# Patient Record
Sex: Female | Born: 1935
Health system: Southern US, Community
[De-identification: ages and names within clinical notes are randomized; demographics above are authoritative.]

## PROBLEM LIST (undated history)

## (undated) DIAGNOSIS — R112 Nausea with vomiting, unspecified: Secondary | ICD-10-CM

## (undated) DIAGNOSIS — F0390 Unspecified dementia without behavioral disturbance: Secondary | ICD-10-CM

## (undated) DIAGNOSIS — E039 Hypothyroidism, unspecified: Secondary | ICD-10-CM

## (undated) DIAGNOSIS — H5461 Unqualified visual loss, right eye, normal vision left eye: Secondary | ICD-10-CM

## (undated) DIAGNOSIS — IMO0001 Reserved for inherently not codable concepts without codable children: Secondary | ICD-10-CM

## (undated) DIAGNOSIS — I10 Essential (primary) hypertension: Secondary | ICD-10-CM

## (undated) DIAGNOSIS — M1990 Unspecified osteoarthritis, unspecified site: Secondary | ICD-10-CM

## (undated) DIAGNOSIS — Z9889 Other specified postprocedural states: Secondary | ICD-10-CM

## (undated) DIAGNOSIS — D649 Anemia, unspecified: Secondary | ICD-10-CM

## (undated) DIAGNOSIS — K623 Rectal prolapse: Secondary | ICD-10-CM

## (undated) DIAGNOSIS — H3530 Unspecified macular degeneration: Secondary | ICD-10-CM

## (undated) DIAGNOSIS — K219 Gastro-esophageal reflux disease without esophagitis: Secondary | ICD-10-CM

## (undated) DIAGNOSIS — Z5189 Encounter for other specified aftercare: Secondary | ICD-10-CM

## (undated) DIAGNOSIS — E785 Hyperlipidemia, unspecified: Secondary | ICD-10-CM

## (undated) DIAGNOSIS — H9190 Unspecified hearing loss, unspecified ear: Secondary | ICD-10-CM

## (undated) DIAGNOSIS — H409 Unspecified glaucoma: Secondary | ICD-10-CM

## (undated) HISTORY — DX: Unspecified dementia, unspecified severity, without behavioral disturbance, psychotic disturbance, mood disturbance, and anxiety: F03.90

## (undated) HISTORY — DX: Encounter for other specified aftercare: Z51.89

## (undated) HISTORY — DX: Hyperlipidemia, unspecified: E78.5

## (undated) HISTORY — DX: Essential (primary) hypertension: I10

## (undated) HISTORY — DX: Hypothyroidism, unspecified: E03.9

## (undated) HISTORY — PX: VEIN LIGATION AND STRIPPING: SHX2653

## (undated) HISTORY — PX: ABDOMINAL HYSTERECTOMY: SHX81

## (undated) HISTORY — DX: Rectal prolapse: K62.3

## (undated) HISTORY — PX: CARPAL TUNNEL RELEASE: SHX101

## (undated) HISTORY — DX: Gastro-esophageal reflux disease without esophagitis: K21.9

## (undated) HISTORY — DX: Anemia, unspecified: D64.9

## (undated) HISTORY — DX: Unspecified osteoarthritis, unspecified site: M19.90

## (undated) HISTORY — DX: Reserved for inherently not codable concepts without codable children: IMO0001

---

## 1998-08-22 ENCOUNTER — Ambulatory Visit (HOSPITAL_COMMUNITY): Admission: RE | Admit: 1998-08-22 | Discharge: 1998-08-22 | Payer: Self-pay | Admitting: Ophthalmology

## 1998-09-10 HISTORY — PX: OTHER SURGICAL HISTORY: SHX169

## 1999-02-28 ENCOUNTER — Encounter: Payer: Self-pay | Admitting: Orthopaedic Surgery

## 1999-02-28 ENCOUNTER — Ambulatory Visit (HOSPITAL_COMMUNITY): Admission: RE | Admit: 1999-02-28 | Discharge: 1999-02-28 | Payer: Self-pay | Admitting: Orthopaedic Surgery

## 1999-04-19 ENCOUNTER — Encounter: Payer: Self-pay | Admitting: Neurosurgery

## 1999-04-21 ENCOUNTER — Encounter: Payer: Self-pay | Admitting: Neurosurgery

## 1999-04-21 ENCOUNTER — Inpatient Hospital Stay (HOSPITAL_COMMUNITY): Admission: RE | Admit: 1999-04-21 | Discharge: 1999-05-10 | Payer: Self-pay | Admitting: Neurosurgery

## 1999-05-11 HISTORY — PX: LUMBAR FUSION: SHX111

## 1999-05-19 ENCOUNTER — Inpatient Hospital Stay (HOSPITAL_COMMUNITY): Admission: EM | Admit: 1999-05-19 | Discharge: 1999-05-24 | Payer: Self-pay | Admitting: Emergency Medicine

## 1999-05-19 ENCOUNTER — Encounter: Payer: Self-pay | Admitting: Neurosurgery

## 1999-05-24 ENCOUNTER — Inpatient Hospital Stay
Admission: RE | Admit: 1999-05-24 | Discharge: 1999-06-01 | Payer: Self-pay | Admitting: Physical Medicine and Rehabilitation

## 1999-06-16 ENCOUNTER — Encounter: Admission: RE | Admit: 1999-06-16 | Discharge: 1999-06-16 | Payer: Self-pay | Admitting: Neurosurgery

## 1999-06-16 ENCOUNTER — Encounter: Payer: Self-pay | Admitting: Neurosurgery

## 1999-07-27 ENCOUNTER — Encounter: Admission: RE | Admit: 1999-07-27 | Discharge: 1999-07-27 | Payer: Self-pay | Admitting: Neurosurgery

## 1999-07-27 ENCOUNTER — Encounter: Payer: Self-pay | Admitting: Neurosurgery

## 2000-03-13 ENCOUNTER — Encounter: Payer: Self-pay | Admitting: Family Medicine

## 2000-03-13 ENCOUNTER — Encounter: Admission: RE | Admit: 2000-03-13 | Discharge: 2000-03-13 | Payer: Self-pay | Admitting: Family Medicine

## 2000-06-19 ENCOUNTER — Encounter: Admission: RE | Admit: 2000-06-19 | Discharge: 2000-09-17 | Payer: Self-pay | Admitting: Family Medicine

## 2000-08-12 ENCOUNTER — Encounter: Admission: RE | Admit: 2000-08-12 | Discharge: 2000-08-12 | Payer: Self-pay | Admitting: Orthopaedic Surgery

## 2000-08-12 ENCOUNTER — Encounter: Payer: Self-pay | Admitting: Orthopaedic Surgery

## 2000-08-13 ENCOUNTER — Ambulatory Visit (HOSPITAL_BASED_OUTPATIENT_CLINIC_OR_DEPARTMENT_OTHER): Admission: RE | Admit: 2000-08-13 | Discharge: 2000-08-13 | Payer: Self-pay | Admitting: Orthopaedic Surgery

## 2001-06-11 LAB — HM COLONOSCOPY: HM Colonoscopy: NORMAL

## 2002-03-18 ENCOUNTER — Encounter: Admission: RE | Admit: 2002-03-18 | Discharge: 2002-06-16 | Payer: Self-pay | Admitting: Family Medicine

## 2003-10-11 LAB — HM MAMMOGRAPHY

## 2004-01-04 ENCOUNTER — Ambulatory Visit (HOSPITAL_COMMUNITY): Admission: RE | Admit: 2004-01-04 | Discharge: 2004-01-04 | Payer: Self-pay | Admitting: Ophthalmology

## 2004-03-12 HISTORY — PX: CATARACT EXTRACTION: SUR2

## 2004-04-24 ENCOUNTER — Ambulatory Visit: Payer: Self-pay | Admitting: Family Medicine

## 2004-06-05 ENCOUNTER — Ambulatory Visit: Payer: Self-pay | Admitting: Family Medicine

## 2004-07-17 ENCOUNTER — Ambulatory Visit: Payer: Self-pay | Admitting: Family Medicine

## 2005-02-26 ENCOUNTER — Ambulatory Visit: Payer: Self-pay | Admitting: Family Medicine

## 2005-06-14 ENCOUNTER — Ambulatory Visit: Payer: Self-pay | Admitting: Family Medicine

## 2006-01-10 ENCOUNTER — Ambulatory Visit: Payer: Self-pay | Admitting: Family Medicine

## 2006-01-10 LAB — CONVERTED CEMR LAB: Hgb A1c MFr Bld: 6.3 %

## 2006-05-23 ENCOUNTER — Emergency Department (HOSPITAL_COMMUNITY): Admission: EM | Admit: 2006-05-23 | Discharge: 2006-05-23 | Payer: Self-pay | Admitting: Family Medicine

## 2006-07-15 ENCOUNTER — Encounter: Payer: Self-pay | Admitting: Family Medicine

## 2006-07-15 DIAGNOSIS — E039 Hypothyroidism, unspecified: Secondary | ICD-10-CM | POA: Insufficient documentation

## 2006-07-15 DIAGNOSIS — H269 Unspecified cataract: Secondary | ICD-10-CM | POA: Insufficient documentation

## 2006-07-15 DIAGNOSIS — D649 Anemia, unspecified: Secondary | ICD-10-CM | POA: Insufficient documentation

## 2006-07-23 DIAGNOSIS — F0390 Unspecified dementia without behavioral disturbance: Secondary | ICD-10-CM | POA: Insufficient documentation

## 2006-07-23 DIAGNOSIS — J309 Allergic rhinitis, unspecified: Secondary | ICD-10-CM | POA: Insufficient documentation

## 2006-07-23 DIAGNOSIS — E785 Hyperlipidemia, unspecified: Secondary | ICD-10-CM | POA: Insufficient documentation

## 2006-07-23 DIAGNOSIS — M1990 Unspecified osteoarthritis, unspecified site: Secondary | ICD-10-CM | POA: Insufficient documentation

## 2006-07-23 DIAGNOSIS — I10 Essential (primary) hypertension: Secondary | ICD-10-CM | POA: Insufficient documentation

## 2006-07-23 DIAGNOSIS — E119 Type 2 diabetes mellitus without complications: Secondary | ICD-10-CM | POA: Insufficient documentation

## 2006-07-24 ENCOUNTER — Encounter: Admission: RE | Admit: 2006-07-24 | Discharge: 2006-10-22 | Payer: Self-pay | Admitting: Ophthalmology

## 2006-09-19 ENCOUNTER — Ambulatory Visit: Payer: Self-pay | Admitting: Family Medicine

## 2006-09-20 LAB — CONVERTED CEMR LAB
Hgb A1c MFr Bld: 6.4 % — ABNORMAL HIGH (ref 4.6–6.0)
TSH: 1.44 microintl units/mL (ref 0.35–5.50)

## 2006-10-09 ENCOUNTER — Telehealth: Payer: Self-pay | Admitting: Family Medicine

## 2006-10-14 ENCOUNTER — Telehealth (INDEPENDENT_AMBULATORY_CARE_PROVIDER_SITE_OTHER): Payer: Self-pay | Admitting: *Deleted

## 2007-03-24 ENCOUNTER — Ambulatory Visit: Payer: Self-pay | Admitting: Internal Medicine

## 2007-03-24 DIAGNOSIS — K219 Gastro-esophageal reflux disease without esophagitis: Secondary | ICD-10-CM | POA: Insufficient documentation

## 2007-03-24 DIAGNOSIS — R32 Unspecified urinary incontinence: Secondary | ICD-10-CM | POA: Insufficient documentation

## 2007-03-24 LAB — CONVERTED CEMR LAB
ALT: 18 units/L (ref 0–35)
AST: 26 units/L (ref 0–37)
Albumin: 4.1 g/dL (ref 3.5–5.2)
Alkaline Phosphatase: 87 units/L (ref 39–117)
BUN: 12 mg/dL (ref 6–23)
Bilirubin, Direct: 0.1 mg/dL (ref 0.0–0.3)
CO2: 27 meq/L (ref 19–32)
Calcium: 9.6 mg/dL (ref 8.4–10.5)
Chloride: 101 meq/L (ref 96–112)
Cholesterol: 124 mg/dL (ref 0–200)
Creatinine, Ser: 0.7 mg/dL (ref 0.4–1.2)
GFR calc Af Amer: 103 mL/min
GFR calc non Af Amer: 85 mL/min
Glucose, Bld: 110 mg/dL — ABNORMAL HIGH (ref 70–99)
HDL: 45.5 mg/dL (ref >39.0)
Hgb A1c MFr Bld: 6.1 % — ABNORMAL HIGH (ref 4.6–6.0)
LDL Cholesterol: 67 mg/dL (ref 0–99)
Potassium: 3.7 meq/L (ref 3.5–5.1)
Sodium: 138 meq/L (ref 135–145)
TSH: 2.03 microintl units/mL (ref 0.35–5.50)
Total Bilirubin: 1.1 mg/dL (ref 0.3–1.2)
Total CHOL/HDL Ratio: 2.7
Total Protein: 7.7 g/dL (ref 6.0–8.3)
Triglycerides: 56 mg/dL (ref 0–149)
VLDL: 11 mg/dL (ref 0–40)

## 2007-03-25 ENCOUNTER — Encounter: Payer: Self-pay | Admitting: Internal Medicine

## 2007-04-29 ENCOUNTER — Telehealth: Payer: Self-pay | Admitting: Internal Medicine

## 2007-05-05 ENCOUNTER — Encounter: Payer: Self-pay | Admitting: Internal Medicine

## 2007-09-15 ENCOUNTER — Encounter: Payer: Self-pay | Admitting: Internal Medicine

## 2007-09-22 ENCOUNTER — Ambulatory Visit: Payer: Self-pay | Admitting: Internal Medicine

## 2007-09-22 LAB — CONVERTED CEMR LAB
ALT: 16 units/L (ref 0–35)
AST: 23 units/L (ref 0–37)
Albumin: 4 g/dL (ref 3.5–5.2)
Alkaline Phosphatase: 77 units/L (ref 39–117)
BUN: 25 mg/dL — ABNORMAL HIGH (ref 6–23)
Basophils Absolute: 0 10*3/uL (ref 0.0–0.1)
Basophils Relative: 0.6 % (ref 0.0–1.0)
Bilirubin, Direct: 0.1 mg/dL (ref 0.0–0.3)
CO2: 29 meq/L (ref 19–32)
Calcium: 9.4 mg/dL (ref 8.4–10.5)
Chloride: 104 meq/L (ref 96–112)
Cholesterol: 113 mg/dL (ref 0–200)
Creatinine, Ser: 0.8 mg/dL (ref 0.4–1.2)
Eosinophils Absolute: 0.5 10*3/uL (ref 0.0–0.7)
Eosinophils Relative: 6.5 % — ABNORMAL HIGH (ref 0.0–5.0)
GFR calc Af Amer: 88 mL/min
GFR calc non Af Amer: 73 mL/min
Glucose, Bld: 106 mg/dL — ABNORMAL HIGH (ref 70–99)
HCT: 34.2 % — ABNORMAL LOW (ref 36.0–46.0)
HDL: 36.9 mg/dL — ABNORMAL LOW (ref >39.0)
Hemoglobin: 12 g/dL (ref 12.0–15.0)
Hgb A1c MFr Bld: 6.3 % — ABNORMAL HIGH (ref 4.6–6.0)
LDL Cholesterol: 57 mg/dL (ref 0–99)
Lymphocytes Relative: 25.9 % (ref 12.0–46.0)
MCHC: 34.9 g/dL (ref 30.0–36.0)
MCV: 88.9 fL (ref 78.0–100.0)
Monocytes Absolute: 0.6 10*3/uL (ref 0.1–1.0)
Monocytes Relative: 8.7 % (ref 3.0–12.0)
Neutro Abs: 4.3 10*3/uL (ref 1.4–7.7)
Neutrophils Relative %: 58.3 % (ref 43.0–77.0)
Platelets: 219 10*3/uL (ref 150–400)
Potassium: 4.4 meq/L (ref 3.5–5.1)
RBC: 3.85 M/uL — ABNORMAL LOW (ref 3.87–5.11)
RDW: 13 % (ref 11.5–14.6)
Sodium: 141 meq/L (ref 135–145)
Total Bilirubin: 0.8 mg/dL (ref 0.3–1.2)
Total CHOL/HDL Ratio: 3.1
Total Protein: 7.2 g/dL (ref 6.0–8.3)
Triglycerides: 95 mg/dL (ref 0–149)
VLDL: 19 mg/dL (ref 0–40)
WBC: 7.3 10*3/uL (ref 4.5–10.5)

## 2007-09-23 ENCOUNTER — Telehealth: Payer: Self-pay | Admitting: Internal Medicine

## 2007-09-23 ENCOUNTER — Encounter: Payer: Self-pay | Admitting: Internal Medicine

## 2007-10-09 ENCOUNTER — Telehealth: Payer: Self-pay | Admitting: Internal Medicine

## 2007-11-25 ENCOUNTER — Telehealth: Payer: Self-pay | Admitting: Internal Medicine

## 2007-11-26 ENCOUNTER — Telehealth: Payer: Self-pay | Admitting: Internal Medicine

## 2008-04-15 ENCOUNTER — Ambulatory Visit: Payer: Self-pay | Admitting: Internal Medicine

## 2008-04-15 LAB — CONVERTED CEMR LAB
ALT: 19 units/L (ref 0–35)
AST: 24 units/L (ref 0–37)
Albumin: 4.1 g/dL (ref 3.5–5.2)
Alkaline Phosphatase: 83 units/L (ref 39–117)
BUN: 24 mg/dL — ABNORMAL HIGH (ref 6–23)
Bilirubin, Direct: 0.1 mg/dL (ref 0.0–0.3)
CO2: 0 meq/L — CL (ref 19–32)
Calcium: 9.7 mg/dL (ref 8.4–10.5)
Chloride: 110 meq/L (ref 96–112)
Cholesterol: 105 mg/dL (ref 0–200)
Creatinine, Ser: 0.8 mg/dL (ref 0.4–1.2)
GFR calc Af Amer: 88 mL/min
GFR calc non Af Amer: 72 mL/min
Glucose, Bld: 99 mg/dL (ref 70–99)
HDL: 39 mg/dL (ref >39.0)
LDL Cholesterol: 54 mg/dL (ref 0–99)
Potassium: 4.8 meq/L (ref 3.5–5.1)
Sodium: 141 meq/L (ref 135–145)
TSH: 2.9 microintl units/mL (ref 0.35–5.50)
Total Bilirubin: 0.6 mg/dL (ref 0.3–1.2)
Total CHOL/HDL Ratio: 2.7
Total Protein: 7.1 g/dL (ref 6.0–8.3)
Triglycerides: 62 mg/dL (ref 0–149)
VLDL: 12 mg/dL (ref 0–40)

## 2008-04-15 LAB — HM DIABETES FOOT EXAM

## 2008-04-16 ENCOUNTER — Encounter: Payer: Self-pay | Admitting: Internal Medicine

## 2008-06-03 ENCOUNTER — Encounter: Payer: Self-pay | Admitting: Internal Medicine

## 2008-09-02 ENCOUNTER — Telehealth: Payer: Self-pay | Admitting: Internal Medicine

## 2008-09-06 ENCOUNTER — Encounter: Payer: Self-pay | Admitting: Internal Medicine

## 2009-04-20 ENCOUNTER — Telehealth: Payer: Self-pay | Admitting: Internal Medicine

## 2009-04-28 ENCOUNTER — Ambulatory Visit: Payer: Self-pay | Admitting: Internal Medicine

## 2009-04-28 LAB — CONVERTED CEMR LAB
ALT: 19 units/L (ref 0–35)
AST: 23 units/L (ref 0–37)
Albumin: 4.1 g/dL (ref 3.5–5.2)
Alkaline Phosphatase: 104 units/L (ref 39–117)
BUN: 20 mg/dL (ref 6–23)
Basophils Absolute: 0.1 10*3/uL (ref 0.0–0.1)
Basophils Relative: 0.9 % (ref 0.0–3.0)
Bilirubin, Direct: 0.1 mg/dL (ref 0.0–0.3)
CO2: 29 meq/L (ref 19–32)
Calcium: 9.2 mg/dL (ref 8.4–10.5)
Chloride: 105 meq/L (ref 96–112)
Cholesterol: 103 mg/dL (ref 0–200)
Creatinine, Ser: 0.9 mg/dL (ref 0.4–1.2)
Eosinophils Absolute: 0.3 10*3/uL (ref 0.0–0.7)
Eosinophils Relative: 3.4 % (ref 0.0–5.0)
GFR calc non Af Amer: 63.04 mL/min (ref >60)
Glucose, Bld: 147 mg/dL — ABNORMAL HIGH (ref 70–99)
HCT: 34.2 % — ABNORMAL LOW (ref 36.0–46.0)
HDL: 47.5 mg/dL (ref >39.00)
Hemoglobin: 11.4 g/dL — ABNORMAL LOW (ref 12.0–15.0)
Hgb A1c MFr Bld: 6.5 % (ref 4.6–6.5)
LDL Cholesterol: 45 mg/dL (ref 0–99)
Lymphocytes Relative: 22.5 % (ref 12.0–46.0)
Lymphs Abs: 1.8 10*3/uL (ref 0.7–4.0)
MCHC: 33.3 g/dL (ref 30.0–36.0)
MCV: 90.6 fL (ref 78.0–100.0)
Monocytes Absolute: 0.6 10*3/uL (ref 0.1–1.0)
Monocytes Relative: 7.7 % (ref 3.0–12.0)
Neutro Abs: 5.3 10*3/uL (ref 1.4–7.7)
Neutrophils Relative %: 65.5 % (ref 43.0–77.0)
Platelets: 204 10*3/uL (ref 150.0–400.0)
Potassium: 4.8 meq/L (ref 3.5–5.1)
RBC: 3.77 M/uL — ABNORMAL LOW (ref 3.87–5.11)
RDW: 12.7 % (ref 11.5–14.6)
Sodium: 140 meq/L (ref 135–145)
TSH: 2.68 microintl units/mL (ref 0.35–5.50)
Total Bilirubin: 0.4 mg/dL (ref 0.3–1.2)
Total CHOL/HDL Ratio: 2
Total Protein: 7.1 g/dL (ref 6.0–8.3)
Triglycerides: 54 mg/dL (ref 0.0–149.0)
VLDL: 10.8 mg/dL (ref 0.0–40.0)
WBC: 8.1 10*3/uL (ref 4.5–10.5)

## 2009-07-04 ENCOUNTER — Encounter: Payer: Self-pay | Admitting: Internal Medicine

## 2010-02-19 ENCOUNTER — Encounter: Payer: Self-pay | Admitting: Internal Medicine

## 2010-04-13 NOTE — Medication Information (Signed)
Summary: Diabetes Supplies/[facility]  Diabetes Supplies/[facility]   Imported By: Sherian Rein 07/06/2009 11:05:19  _____________________________________________________________________  External Attachment:    Type:   Image     Comment:   External Document

## 2010-04-13 NOTE — Letter (Signed)
Summary: Diabetes Supplies/[facility]  Diabetes Supplies/[facility]   Imported By: Sherian Rein 02/23/2010 14:27:12  _____________________________________________________________________  External Attachment:    Type:   Image     Comment:   External Document

## 2010-04-13 NOTE — Progress Notes (Signed)
Summary: labs/ov  Phone Note Call from Patient   Summary of Call: Patient daughter scheduled her for a 2:50 pm appt.  Would you like to do labs prior to this ov or should this be changed to an AM appt for patient to do labs? Please advise. Initial call taken by: Lucious Groves,  April 20, 2009 12:03 PM  Follow-up for Phone Call        generally for medicare patients labs are to be incident to an office visit. No labs prior to visit.  Follow-up by: Jacques Navy MD,  April 20, 2009 1:25 PM  Additional Follow-up for Phone Call Additional follow up Details #1::        Thanks. Additional Follow-up by: Lucious Groves,  April 20, 2009 2:12 PM

## 2010-04-13 NOTE — Assessment & Plan Note (Signed)
Summary: EST PT YEARLY VISIT PER MD/KB   Vital Signs:  Patient profile:   77 year old female Height:      64 inches Weight:      141 pounds O2 Sat:      98 % on Room air Temp:     97.8 degrees F oral Pulse rate:   96 / minute BP sitting:   118 / 72  (left arm) Cuff size:   regular  Vitals Entered By: Bill Salinas CMA (April 28, 2009 2:51 PM)  O2 Flow:  Room air CC: pt here for yearly visit, pt wants to discuss Avapro and see if there in another cheaper alternative. Pt is unsure of last tetanus, she has never had pneumonia or shingles vaccine, she no longer does mammograms or paps/ ab   Primary Care Provider:  [facility]  CC:  pt here for yearly visit, pt wants to discuss Avapro and see if there in another cheaper alternative. Pt is unsure of last tetanus, she has never had pneumonia or shingles vaccine, and she no longer does mammograms or paps/ ab.  History of Present Illness: Patient presents for routine medical follow-up. In the interval she has been doing well with no medical illnesses, surgeries or injuries.  She has had a few falls but no LOC, no injury.   Current Medications (verified): 1)  Hydrochlorothiazide 50 Mg Tabs (Hydrochlorothiazide) .Marland Kitchen.. 1 By Mouth Once Daily 2)  Levothyroxine Sodium 100 Mcg Tabs (Levothyroxine Sodium) .Marland Kitchen.. 1 By Mouth Qd 3)  Simvastatin 20 Mg Tabs (Simvastatin) .Marland Kitchen.. 1 By Mouth Qd 4)  Klor-Con 10 10 Meq Tbcr (Potassium Chloride) .Marland Kitchen.. 1 By Mouth Tid 5)  Avapro 300 Mg  Tabs (Irbesartan) .Marland Kitchen.. 1 By Mouth Once Daily 6)  Norvasc 10 Mg Tabs (Amlodipine Besylate) .... Take One By Mouth Daily 7)  Aspirin 81 Mg Tbec (Aspirin) .... Take By Mouth Daily As Directed 8)  Vitamin E 1000 Unit  Caps (Vitamin E) .... Take 1 Tablet By Mouth Once A Day 9)  Vitamin C 1000 Mg  Tabs (Ascorbic Acid) .... Take 1 Tablet By Mouth Once A Day 10)  Multivitamins   Tabs (Multiple Vitamin) .... Take 1 Tablet By Mouth Once A Day 11)  Tums 500 Mg  Chew (Calcium Carbonate Antacid)  .... Take 2 Tablet By Mouth Once A Day 12)  Benefiber   Tabs (Wheat Dextrin) .... Daily 13)  Nevanac 0.1 %  Susp (Nepafenac) .... Two Times A Day 14)  Aleve 220 Mg Tabs (Naproxen Sodium) .... Take 1 Tablet By Mouth Once A Day 15)  Alphagan P 0.15 % Soln (Brimonidine Tartrate) .... 2 Drops Daily  Allergies (verified): 1)  Vioxx  Past History:  Past Medical History: Last updated: 04-20-2007 Dementia Diabetes mellitus, type II-diet controlled Hyperlipidemia Hypertension Osteoarthritis- hands Allergic rhinitis h/o anemia GERD Hypothyroidism Urinary incontinence-stress  Past Surgical History: Last updated: 04-20-2007 Carpal tunnel release- bilateral Cataract extraction (2006)-left Hysterectomy-nonmalignant reasons Vein stripping-left leg Right eye surgery (09/1998) Lumbar fusion (03/01) Colonoscopy- diverticulosis, hemorrhoids (06/2001)  Family History: Last updated: 2007/04/20 Father-deceased: MI Mother-deceased CVA, grief brother - MI at 58 Neg-breast, colon cancer  Social History: Last updated: 04-20-2007 married 30 years -divorced; remarried 89 -widowed '83  1 daughter 1 son 1 grandchild, 2 great grandchildren worked in Hospital doctor, retired lives alone - IADLs except driving Terminal care issues: patient clearly states, daughter present, that she would not want heroic or futile care, e.g. CPR, mechanical vent. support, etc.  Review of  Systems  The patient denies anorexia, fever, weight loss, weight gain, decreased hearing, chest pain, dyspnea on exertion, prolonged cough, hemoptysis, melena, incontinence, muscle weakness, difficulty walking, depression, enlarged lymph nodes, and angioedema.    Physical Exam  General:  WNWD elderly white female in no distress Head:  Normocephalic and atraumatic without obvious abnormalities. No apparent alopecia or balding. Eyes:  vision grossly intact, pupils equal, pupils round, corneas and lenses clear, and  no injection.   Ears:  R ear normal and L ear normal.   Mouth:  native dentition with some missing molars, no buccal lesions throat clear Neck:  supple, full ROM, and no thyromegaly.   Chest Wall:  kyphosis moderate to severe Breasts:  No mass, nodules, thickening, tenderness, bulging, retraction, inflamation, nipple discharge or skin changes noted.   Lungs:  normal respiratory effort, no intercostal retractions, no accessory muscle use, normal breath sounds, no crackles, and no wheezes.   Heart:  normal rate, regular rhythm, no murmur, and no JVD.   Abdomen:  soft, non-tender, normal bowel sounds, no masses, no rigidity, no abdominal hernia, and no hepatomegaly.   Msk:  normal ROM, no redness over joints, no joint deformities, and no joint instability.   Pulses:  2+ radial pulse, 1+ DP pulse Extremities:  No clubbing, cyanosis, edema, or deformity noted with normal full range of motion of all joints.   Neurologic:  alert & oriented X3, cranial nerves II-XII intact, strength normal in all extremities, sensation intact to light touch, sensation intact to pinprick, gait normal, and DTRs symmetrical and normal.     Impression & Recommendations:  Problem # 1:  URINARY INCONTINENCE (ICD-788.30) She seems to be doing better. Will not add any medications at this time.  Problem # 2:  GERD (ICD-530.81) State on H2 blocker therapy.  The following medications were removed from the medication list:    Famotidine 20 Mg Tabs (Famotidine) .Marland Kitchen... 1 by mouth bid Her updated medication list for this problem includes:    Tums 500 Mg Chew (Calcium carbonate antacid) .Marland Kitchen... Take 2 tablet by mouth once a day  Problem # 3:  Hx of HYPOTHYROIDISM (ICD-244.9) Due for lab with recommendations to follow.  Her updated medication list for this problem includes:    Levothyroxine Sodium 100 Mcg Tabs (Levothyroxine sodium) .Marland Kitchen... 1 by mouth qd  Orders: TLB-TSH (Thyroid Stimulating Hormone) (84443-TSH)  Addendum -  TSH 2.68 which reflects good control  Plan continue present dose of levothyroxine  Problem # 4:  Hx of ANEMIA (ICD-285.9) Due for lab follow up. Orders: TLB-CBC Platelet - w/Differential (85025-CBCD)  Addendum - Hemogobin is stable  Problem # 5:  ALLERGIC RHINITIS (ICD-477.9) Chronic problem.  Plan - ok to use otcd non-sedating medication like loratadine  Problem # 6:  HYPERLIPIDEMIA (ICD-272.4) Due for follow up lab with recommendations to follow.  Her updated medication list for this problem includes:    Simvastatin 20 Mg Tabs (Simvastatin) .Marland Kitchen... 1 by mouth qd  Orders: TLB-Lipid Panel (80061-LIPID) TLB-Hepatic/Liver Function Pnl (80076-HEPATIC)  Addendum - HDL 47.5, LDL 45 = excellent control  Plan - continue present medication  Problem # 7:  DIABETES MELLITUS, TYPE II (ICD-250.00) Patient has been diet controlled. She has been asymptomatic. Due for follow-up lab.  Her updated medication list for this problem includes:    Losartan Potassium 100 Mg Tabs (Losartan potassium) .Marland Kitchen... 1 by mouth once daily    Aspirin 81 Mg Tbec (Aspirin) .Marland Kitchen... Take by mouth daily as directed  Orders: TLB-A1C /  Hgb A1C (Glycohemoglobin) (83036-A1C)  Addendum - A1C 6.5% = excellent control. No need to initiate any medical therapy  Problem # 8:  DEMENTIA (ICD-294.8) Patient is stable at home. She has a very supportive family. No indication for medication.  Problem # 9:  Preventive Health Care (ICD-V70.0) Normal physical exam, lab results are excellent.  Patient resistent to mammogram-breast exam was normal. Last colonoscopy in '03. She has had tetnus and flu shots. She is a candidate for pneumonia vaccine.   In summary - a delightful woman who appears to be medically stable. She will return as needed or 6 months.  Complete Medication List: 1)  Hydrochlorothiazide 50 Mg Tabs (Hydrochlorothiazide) .Marland Kitchen.. 1 by mouth once daily 2)  Levothyroxine Sodium 100 Mcg Tabs (Levothyroxine sodium) .Marland Kitchen.. 1  by mouth qd 3)  Simvastatin 20 Mg Tabs (Simvastatin) .Marland Kitchen.. 1 by mouth qd 4)  Klor-con 10 10 Meq Tbcr (Potassium chloride) .Marland Kitchen.. 1 by mouth tid 5)  Losartan Potassium 100 Mg Tabs (Losartan potassium) .Marland Kitchen.. 1 by mouth once daily 6)  Norvasc 10 Mg Tabs (Amlodipine besylate) .... Take one by mouth daily 7)  Aspirin 81 Mg Tbec (Aspirin) .... Take by mouth daily as directed 8)  Vitamin E 1000 Unit Caps (Vitamin e) .... Take 1 tablet by mouth once a day 9)  Vitamin C 1000 Mg Tabs (Ascorbic acid) .... Take 1 tablet by mouth once a day 10)  Multivitamins Tabs (Multiple vitamin) .... Take 1 tablet by mouth once a day 11)  Tums 500 Mg Chew (Calcium carbonate antacid) .... Take 2 tablet by mouth once a day 12)  Benefiber Tabs (Wheat dextrin) .... Daily 13)  Nevanac 0.1 % Susp (Nepafenac) .... Two times a day 14)  Aleve 220 Mg Tabs (Naproxen sodium) .... Take 1 tablet by mouth once a day 15)  Alphagan P 0.15 % Soln (Brimonidine tartrate) .... 2 drops daily  Other Orders: Prescription Created Electronically [id number]) TLB-BMP (Basic Metabolic Panel-BMET) (80048-METABOL)  ent: Denisse Coiner Note: All result statuses are Final unless otherwise noted.  Tests: (1) TSH (TSH)   FastTSH                   2.68 uIU/mL                 0.35-5.50  Tests: (2) BMP (METABOL)   Sodium                    140 mEq/L                   135-145   Potassium                 4.8 mEq/L                   3.5-5.1   Chloride                  105 mEq/L                   96-112   Carbon Dioxide            29 mEq/L                    19-32   Glucose              [H]  147 mg/dL                   60-45  BUN                       20 mg/dL                    8-46   Creatinine                0.9 mg/dL                   9.6-2.9   Calcium                   9.2 mg/dL                   5.2-84.1   GFR                       63.04 mL/min                >60  Tests: (3) Lipid Panel (LIPID)   Cholesterol               103 mg/dL                    3-244     ATP III Classification            Desirable:  < 200 mg/dL                    Borderline High:  200 - 239 mg/dL               High:  > = 240 mg/dL   Triglycerides             54.0 mg/dL                  0.1-027.2     Normal:  <150 mg/dL     Borderline High:  536 - 199 mg/dL   HDL                       64.40 mg/dL                 >34.74   VLDL Cholesterol          10.8 mg/dL                  2.5-95.6   LDL Cholesterol           45 mg/dL                    3-87  CHO/HDL Ratio:  CHD Risk                             2                    Men          Women     1/2 Average Risk     3.4          3.3     Average Risk          5.0          4.4     2X Average Risk          9.6          7.1     3X Average Risk  15.0          11.0                           Tests: (4) Hepatic/Liver Function Panel (HEPATIC)   Total Bilirubin           0.4 mg/dL                   1.1-9.1   Direct Bilirubin          0.1 mg/dL                   4.7-8.2   Alkaline Phosphatase      104 U/L                     39-117   AST                       23 U/L                      0-37   ALT                       19 U/L                      0-35   Total Protein             7.1 g/dL                    9.5-6.2   Albumin                   4.1 g/dL                    1.3-0.8  Tests: (5) Hemoglobin A1C (A1C)   Hemoglobin A1C            6.5 %                       4.6-6.5     Glycemic Control Guidelines for People with Diabetes:     Non Diabetic:  <6%     Goal of Therapy: <7%     Additional Action Suggested:  >8%   Tests: (6) CBC Platelet w/Diff (CBCD)   White Cell Count          8.1 K/uL                    4.5-10.5   Red Cell Count       [L]  3.77 Mil/uL                 3.87-5.11   Hemoglobin           [L]  11.4 g/dL                   65.7-84.6   Hematocrit           [L]  34.2 %                      36.0-46.0   MCV                       90.6 fl  78.0-100.0   MCHC                      33.3  g/dL                   74.2-59.5   RDW                       12.7 %                      11.5-14.6   Platelet Count            204.0 K/uL                  150.0-400.0   Neutrophil %              65.5 %                      43.0-77.0   Lymphocyte %              22.5 %                      12.0-46.0   Monocyte %                7.7 %                       3.0-12.0   Eosinophils%              3.4 %                       0.0-5.0   Basophils %               0.9 %                       0.0-3.0   Neutrophill Absolute      5.3 K/uL                    1.4-7.7   Lymphocyte Absolute       1.8 K/uL                    0.7-4.0   Monocyte Absolute         0.6 K/uL                    0.1-1.0  Eosinophils, Absolute                             0.3 K/uL                    0.0-0.7   Basophils Absolute        0.1 K/uL                    0.0-0.1Prescriptions: LOSARTAN POTASSIUM 100 MG TABS (LOSARTAN POTASSIUM) 1 by mouth once daily  #90 x 3   Entered and Authorized by:   Jacques Navy MD   Signed by:   Jacques Navy MD on 04/28/2009   Method used:   Faxed to ...       [facility] (mail-order)       [address]       [place],  [place]  [id number]       Ph: [phone]       Fax: [phone]   RxID:   [id number] KLOR-CON 10 10 MEQ TBCR (POTASSIUM CHLORIDE) 1 by mouth tid  #270 x 3   Entered and Authorized by:   Jacques Navy MD   Signed by:   Jacques Navy MD on 04/28/2009   Method used:   Faxed to ...       [facility] (mail-order)       [address]       [place], [place]  [id number]       Ph: [phone]       Fax: [phone]   RxID:   [id number] LEVOTHYROXINE SODIUM 100 MCG TABS (LEVOTHYROXINE SODIUM) 1 by mouth qd  #90 x 3   Entered and Authorized by:   Jacques Navy MD   Signed by:   Jacques Navy MD on 04/28/2009   Method used:   Faxed to ...       [facility] (mail-order)       [address]       [place], [place]   [id number]       Ph: [phone]       Fax: [phone]   RxID:   [id number] HYDROCHLOROTHIAZIDE 50 MG TABS (HYDROCHLOROTHIAZIDE) 1 by mouth once daily  #90 x 3   Entered and Authorized by:   Jacques Navy MD   Signed by:   Jacques Navy MD on 04/28/2009   Method used:   Faxed to ...       [facility] (mail-order)       [address]       [place], [place]  [id number]       Ph: [phone]       Fax: [phone]   RxID:   [id number] KLOR-CON 10 10 MEQ TBCR (POTASSIUM CHLORIDE) 1 by mouth tid  #270 x 3   Entered and Authorized by:   Jacques Navy MD   Signed by:   Jacques Navy MD on 04/28/2009   Method used:   Print then Give to Patient   RxID:   [id number] LEVOTHYROXINE SODIUM 100 MCG TABS (LEVOTHYROXINE SODIUM) 1 by mouth qd  #90 x 3   Entered and Authorized by:   Jacques Navy MD   Signed by:   Jacques Navy MD on 04/28/2009   Method used:   Print then Give to Patient   RxID:   [id number] HYDROCHLOROTHIAZIDE 50 MG TABS (HYDROCHLOROTHIAZIDE) 1 by mouth once daily  #90 x 3   Entered and Authorized by:   Jacques Navy MD   Signed by:   Jacques Navy MD on 04/28/2009   Method used:   Print then Give to Patient   RxID:   [id number] POTASSIUM 100 MG TABS (LOSARTAN POTASSIUM) 1 by mouth once daily  #90 x 3   Entered and Authorized by:   Jacques Navy MD   Signed by:   Jacques Navy MD on 04/28/2009   Method used:   Print then Give to Patient   RxID:   [id number]

## 2010-04-13 NOTE — Progress Notes (Signed)
Summary: Refills  Phone Note Call from Patient   Summary of Call: Pt daughter called stating that pt new insurance needs rx's for Simvastatin and Amlodipine sent to them. It will no longer be [facility]. Please send to [facility] Fax # [phone]. She is aware I will have completed today unless further action is needed.  Patient was last seen 04/2008, ok to send the refills? Initial call taken by: Lucious Groves,  April 20, 2009 10:34 AM  Follow-up for Phone Call        OK fo refills. encourage patient to make an appointment Follow-up by: Jacques Navy MD,  April 20, 2009 11:40 AM  Additional Follow-up for Phone Call Additional follow up Details #1::        Patient daughter notified and needed name of pharmacy that goes with patient plan. Per the daughter it is [facility]. RX sent and daugter scheduled appt. Additional Follow-up by: Lucious Groves,  April 20, 2009 11:55 AM    Prescriptions: NORVASC 10 MG TABS (AMLODIPINE BESYLATE) Take one by mouth daily  #90 x 3   Entered by:   Lucious Groves   Authorized by:   Jacques Navy MD   Signed by:   Lucious Groves on 04/20/2009   Method used:   Faxed to ...       [facility] (mail-order)       [address]       [place], [place]  [id number]       Ph: [phone]       Fax: [phone]   RxID:   [id number] SIMVASTATIN 20 MG TABS (SIMVASTATIN) 1 by mouth qd  #90 x 3   Entered by:   Lucious Groves   Authorized by:   Jacques Navy MD   Signed by:   Lucious Groves on 04/20/2009   Method used:   Faxed to ...       [facility] (mail-order)       [address]       [place], [place]  [id number]       Ph: [phone]       Fax: [phone]   RxID:   [id number]

## 2010-04-25 ENCOUNTER — Telehealth: Payer: Self-pay | Admitting: Internal Medicine

## 2010-04-28 ENCOUNTER — Telehealth: Payer: Self-pay | Admitting: Internal Medicine

## 2010-05-03 NOTE — Progress Notes (Signed)
  Phone Note Refill Request Message from:  Patient on April 28, 2010 4:06 PM  Refills Requested: Medication #1:  SIMVASTATIN 20 MG TABS 1 by mouth qd   Dosage confirmed as above?Dosage Confirmed  Medication #2:  NORVASC 10 MG TABS Take one by mouth daily   Dosage confirmed as above?Dosage Confirmed Pt requested a 30 day supply until mail order sends shipments   Method Requested: Electronic Initial call taken by: Margaret Pyle, CMA,  April 28, 2010 4:07 PM    Prescriptions: NORVASC 10 MG TABS (AMLODIPINE BESYLATE) Take one by mouth daily  #30 x 1   Entered by:   Margaret Pyle, CMA   Authorized by:   Jacques Navy MD   Signed by:   Margaret Pyle, CMA on 04/28/2010   Method used:   Electronically to        [facility] [address] [phone]* (retail)       [address]       [place], [place]  [address]       Ph: [phone] or [phone]       Fax: [phone]   RxID:   [id number] SIMVASTATIN 20 MG TABS (SIMVASTATIN) 1 by mouth qd  #30 x 1   Entered by:   Margaret Pyle, CMA   Authorized by:   Jacques Navy MD   Signed by:   Margaret Pyle, CMA on 04/28/2010   Method used:   [facility]        [facility] [address] [phone]* (retail)       [address]       [place], [place]  [address]       Ph: [phone] or [phone]       Fax: [phone]   RxID:   [id number]

## 2010-05-03 NOTE — Progress Notes (Signed)
Summary: refill  Phone Note Call from Patient Call back at Home Phone [phone] Call back at [phone]   Caller: Daughter/Audry Reason for Call: Talk to Doctor Summary of Call: request refill on meds on  Hydrochlothiazide 50mg  Simvastatin 20mg  Lthyroxine 100mg  klor-con Amlodipine Besylate 10mg   please call into [facility] Fax#[phone], pt has only has enough to last the week losartan 300mg   Member ID [id number] 90 Day Supply Initial call taken by: Migdalia Dk,  April 25, 2010 2:01 PM  Follow-up for Phone Call        OK for year supply?  Follow-up by: Lamar Sprinkles, CMA,  April 25, 2010 6:29 PM  Additional Follow-up for Phone Call Additional follow up Details #1::        Shelba Flake for 1 year Additional Follow-up by: Jacques Navy MD,  April 26, 2010 12:48 PM    Additional Follow-up for Phone Call Additional follow up Details #2::    Done, left mess to call office back. If pt only has enough for 1 week she will need temp (14day) supply to go to local pharmacy, will need to offer this to pt..........................Marland KitchenLamar Sprinkles, CMA  April 26, 2010 6:52 PM   Additional Follow-up for Phone Call Additional follow up Details #3:: Details for Additional Follow-up Action Taken: PT's daughter informed.  Additional Follow-up by: Lamar Sprinkles, CMA,  April 27, 2010 8:54 AM  Prescriptions: Claris Gladden POTASSIUM 100 MG TABS (LOSARTAN POTASSIUM) 1 by mouth once daily  #90 x 3   Entered by:   Lamar Sprinkles, CMA   Authorized by:   Jacques Navy MD   Signed by:   Lamar Sprinkles, CMA on 04/26/2010   Method used:   Electronically to        [facility]* (retail)       [address] [place], [place]  [address]       Ph: [phone]       Fax: [phone]   RxID:   [id number] NORVASC 10 MG TABS (AMLODIPINE BESYLATE) Take one by mouth daily  #90 x 3   Entered by:   Lamar Sprinkles, CMA   Authorized by:   Jacques Navy MD  Signed by:   Lamar Sprinkles, CMA on 04/26/2010   Method used:   Electronically to        [facility]* (retail)       [address] [place], [place]  [address]       Ph: [phone]       Fax: [phone]   RxID:   [id number] KLOR-CON 10 10 MEQ TBCR (POTASSIUM CHLORIDE) 1 by mouth tid  #270 x 3   Entered by:   Lamar Sprinkles, CMA   Authorized by:   Jacques Navy MD   Signed by:   Lamar Sprinkles, CMA on 04/26/2010   Method used:   Electronically to        [facility]* (retail)       [address] [place], [place]  [address]       Ph: [phone]       Fax: [phone]   RxID:   [id number] SIMVASTATIN 20 MG TABS (SIMVASTATIN) 1 by mouth qd  #90 x 3   Entered by:   Lamar Sprinkles, CMA   Authorized by:   Jacques Navy MD   Signed by:   Lamar Sprinkles, CMA on 04/26/2010  Method used:   Electronically to        [facility]* (retail)       [address] [place], [place]  [address]       Ph: [phone]       Fax: [phone]   RxID:   [id number] LEVOTHYROXINE SODIUM 100 MCG TABS (LEVOTHYROXINE SODIUM) 1 by mouth qd  #90 x 3   Entered by:   Lamar Sprinkles, CMA   Authorized by:   Jacques Navy MD   Signed by:   Lamar Sprinkles, CMA on 04/26/2010   Method used:   Electronically to        [facility]* (retail)       [address] [place], [place]  [address]       Ph: [phone]       Fax: [phone]   RxID:   [id number] HYDROCHLOROTHIAZIDE 50 MG TABS (HYDROCHLOROTHIAZIDE) 1 by mouth once daily  #90 x 3   Entered by:   Lamar Sprinkles, CMA   Authorized by:   Jacques Navy MD   Signed by:   Lamar Sprinkles, CMA on 04/26/2010   Method used:   Electronically to        [facility]* (retail)       [address] [place], [place]  [address]       Ph: [phone]       Fax: [phone]   RxID:   [id number]

## 2010-07-25 ENCOUNTER — Encounter: Payer: Self-pay | Admitting: Internal Medicine

## 2010-07-28 ENCOUNTER — Ambulatory Visit (INDEPENDENT_AMBULATORY_CARE_PROVIDER_SITE_OTHER): Payer: Medicare Other | Admitting: Internal Medicine

## 2010-07-28 ENCOUNTER — Other Ambulatory Visit (INDEPENDENT_AMBULATORY_CARE_PROVIDER_SITE_OTHER): Payer: Medicare Other | Admitting: Internal Medicine

## 2010-07-28 ENCOUNTER — Other Ambulatory Visit (INDEPENDENT_AMBULATORY_CARE_PROVIDER_SITE_OTHER): Payer: Medicare Other

## 2010-07-28 DIAGNOSIS — I10 Essential (primary) hypertension: Secondary | ICD-10-CM

## 2010-07-28 DIAGNOSIS — D649 Anemia, unspecified: Secondary | ICD-10-CM

## 2010-07-28 DIAGNOSIS — Z0000 Encounter for general adult medical examination without abnormal findings: Secondary | ICD-10-CM

## 2010-07-28 DIAGNOSIS — E785 Hyperlipidemia, unspecified: Secondary | ICD-10-CM

## 2010-07-28 DIAGNOSIS — E119 Type 2 diabetes mellitus without complications: Secondary | ICD-10-CM

## 2010-07-28 DIAGNOSIS — E039 Hypothyroidism, unspecified: Secondary | ICD-10-CM

## 2010-07-28 LAB — CBC WITH DIFFERENTIAL/PLATELET
Basophils Absolute: 0 10*3/uL (ref 0.0–0.1)
Basophils Relative: 0.4 % (ref 0.0–3.0)
Eosinophils Absolute: 0.2 10*3/uL (ref 0.0–0.7)
Eosinophils Relative: 2.7 % (ref 0.0–5.0)
HCT: 33 % — ABNORMAL LOW (ref 36.0–46.0)
Hemoglobin: 11.1 g/dL — ABNORMAL LOW (ref 12.0–15.0)
Lymphocytes Relative: 18.5 % (ref 12.0–46.0)
Lymphs Abs: 1.5 10*3/uL (ref 0.7–4.0)
MCHC: 33.8 g/dL (ref 30.0–36.0)
MCV: 89.6 fl (ref 78.0–100.0)
Monocytes Absolute: 0.4 10*3/uL (ref 0.1–1.0)
Monocytes Relative: 4.5 % (ref 3.0–12.0)
Neutro Abs: 6.1 10*3/uL (ref 1.4–7.7)
Neutrophils Relative %: 73.9 % (ref 43.0–77.0)
Platelets: 202 10*3/uL (ref 150.0–400.0)
RBC: 3.68 Mil/uL — ABNORMAL LOW (ref 3.87–5.11)
RDW: 14.3 % (ref 11.5–14.6)
WBC: 8.3 10*3/uL (ref 4.5–10.5)

## 2010-07-28 LAB — HEPATIC FUNCTION PANEL
ALT: 18 U/L (ref 0–35)
AST: 23 U/L (ref 0–37)
Albumin: 3.6 g/dL (ref 3.5–5.2)
Alkaline Phosphatase: 91 U/L (ref 39–117)
Bilirubin, Direct: 0.1 mg/dL (ref 0.0–0.3)
Total Bilirubin: 0.6 mg/dL (ref 0.3–1.2)
Total Protein: 6.4 g/dL (ref 6.0–8.3)

## 2010-07-28 LAB — HEMOGLOBIN A1C: Hgb A1c MFr Bld: 6.4 % (ref 4.6–6.5)

## 2010-07-28 LAB — TSH: TSH: 1.87 u[IU]/mL (ref 0.35–5.50)

## 2010-07-28 LAB — BASIC METABOLIC PANEL
BUN: 20 mg/dL (ref 6–23)
CO2: 28 mEq/L (ref 19–32)
Calcium: 9.1 mg/dL (ref 8.4–10.5)
Chloride: 106 mEq/L (ref 96–112)
Creatinine, Ser: 0.8 mg/dL (ref 0.4–1.2)
GFR: 70.98 mL/min (ref >60.00)
Glucose, Bld: 89 mg/dL (ref 70–99)
Potassium: 5 mEq/L (ref 3.5–5.1)
Sodium: 140 mEq/L (ref 135–145)

## 2010-07-28 LAB — LIPID PANEL
Cholesterol: 83 mg/dL (ref 0–200)
HDL: 40.2 mg/dL (ref >39.00)
LDL Cholesterol: 37 mg/dL (ref 0–99)
Total CHOL/HDL Ratio: 2
Triglycerides: 30 mg/dL (ref 0.0–149.0)
VLDL: 6 mg/dL (ref 0.0–40.0)

## 2010-07-28 NOTE — Discharge Summary (Signed)
[place]. [facility]  Patient:    Desiree [name], Desiree [name]                      MRN: [id number] Adm. Date:  [id number] Disc. Date: [id number] Attending:  Emeterio Reeve                           Discharge Summary  ADMISSION DIAGNOSES:  Spinal stenosis, spondylosis, L2-3, L3-4, L4-5.  SERVICE:  Neurosurgery.  HISTORY OF PRESENT ILLNESS:  The patient is a 77 year old, right-handed white female, whose history and physical is recounted on the chart.  She had hip, back, and leg pain on the right side for the past year, was treated with epidural steroids.  She had a prior decompressive operation in 1996 by Dr. Wyonia Hough at 4-5, and she had an MRI because of back and leg pain and is referred to me.  PAST MEDICAL HISTORY:  Hypertension, hypothyroidism.  MEDICATIONS:  Triamterene, Premarin, Sular, Claritin, and Norvasc.  ALLERGIES:  She is allergic to SULFA.  PAST SURGICAL HISTORY:  Remarkable for her back operation.  PHYSICAL EXAMINATION:  GENERAL:  General examination was within normal limits.  NEUROLOGICAL:  Examination, she had dorsiflexion weakness on the right, hip flexion weakness on the right.  MRI showed a disk at L2, L3 compressing the anterior epidural space and the right 3-4 disk space was narrow, both in bed, 4-5 had a grade 1 spondylolithesis, protruding disk, spinal stenosis, lateral recess compression, and facet arthropathy.  HOSPITAL COURSE:  She was admitted after ascertainment of normal laboratory values and underwent a 2-3, 3-4, 4-5 laminectomy and diskectomy and posterior lumbar interbody fusion.  She was found to have bilateral pars fractures of L4 with an L4 and 5 slip, severe degenerative disease at 3-4 and a disk at 2-3.  She had CSF eak and was kept down for a couple of days postoperatively.  She was very slow to mobilize because of back pain.  She also had significant orthostatic hypotension. She was visited by the Rehabilitation  Service as well as by the Internal Medicine Service.  She had some anemia postoperatively and was transfused two units which resulted in improvement in her orthostatic hypotension.  Blood pressure meds were stopped and then the Sular was restarted when she was able to get up and about without orthostatic symptoms.  She has participated in physical therapy. Consideration was given to [facility] inpatient rehabilitation, but difficulties with bed availability as well as ability of the patient to participate full-time precluded this, and so she was kept on 3000, worked in physical therapy.  She is now able to get up and walk all the way down the hall to the elevators and back.  Her strength is full.  Her incision is dry.  She has a little bit of touchiness of the right leg but it is overall much improved in terms of her pain.  Her hematocrit is 35 and is stable.  Her blood pressure is no longer orthostatic.  She is being discharged ome with followup in my office in two weeks for a lateral C-spine x-ray.  At that time we will also check her blood pressure and see if her blood pressure medicines needs to be increased or if her other meds restarted. DD:  05/10/99 TD:  05/10/99 Job: [id number]

## 2010-07-28 NOTE — H&P (Signed)
[place]. [facility]  Patient:    Desiree [name], Desiree [name]                      MRN: [id number] Adm. Date:  [id number] Attending:  Annamarie Dawley                         History and Physical  ADMISSION DIAGNOSIS:  Dehydration and general debilitation.  SERVICE:  Neurosurgery.  HISTORY OF PRESENT ILLNESS:  This is a 77 year old right-handed white lady that a month ago had a lumbar fusion at L2-3, L3-4 and L4-5.  She had a fairly rocky hospital course postoperatively with orthostatic hypotension and some anemia. he eventually got to the point she could get up and get around in her brace and was sent home about a week ago.  Since she has been at home, she has had a low decline with a poor appetite and today, she had a bit of a sinking spells when she was standing up and her legs felt wobbly.  Daughter brought her to the hospital ER.  Since she has been here, she has been awake, alert and oriented, without weakness in the legs.  In fact, she has no focal weakness.  Her strength is intact. When she went home, she was restarted on her regular medications which include thyroid 0.75 mg a day, hydrochlorothiazide 70/50 mg one a day, Premarin 0.625 mg a day,  Sular 10 mg a day, Claritin 10 mg a day, Norvasc 5 mg a day, vitamin E, vitamin C and Tums.  While she was in the hospital the last time, she had a lot of trouble with orthostatic hypotension and quite concerned about that at this time.  MEDICAL HISTORY:  Medical history is otherwise remarkable for a spinal fusion as well as a hysterectomy.  PHYSICAL EXAMINATION:  HEENT:  Exam is within normal limits.  NECK:  She has good range of motion of her neck.  CHEST:  Clear.  CARDIAC:  Regular rate and rhythm.  ABDOMEN:  Nontender.  No hepatosplenomegaly.  EXTREMITIES:  Without clubbing or cyanosis.  GU:  Exam is deferred.  PERIPHERAL PULSES:  Good.  NEUROLOGIC:  She is awake, alert and oriented.  Her  cranial nerves are intact.  Motor exam demonstrates 5/5 strength throughout the upper and lower extremities. She has no sensory deficit.  Reflexes are absent in the upper and lower extremities.  SKIN:  Her skin turgor is very poor.  She is somewhat dry in the mouth.  I am concerned that she is dehydrated.  ASSESSMENT:  Her labs do not demonstrate anemia; however, dehydrated as she is,  that may be an issue as well.  I think she is generally debilitated.  PLAN:  I am going to admit her to the hospital, start her on an IV, do a calorie count and see if we cannot get her up and about in that fashion. DD:  05/19/99 TD:  05/19/99 Job: [id number]

## 2010-07-28 NOTE — Op Note (Signed)
Desiree [name], NUCCIO NO.:  [id number]   MEDICAL RECORD NO.:  [id number]          PATIENT TYPE:  OIB   LOCATION:  2859                         FACILITY:  [facility]   PHYSICIAN:  Guadelupe Sabin, M.D.DATE OF BIRTH:  1920-08-13   DATE OF PROCEDURE:  DATE OF DISCHARGE:  01/04/2004                                 OPERATIVE REPORT   DATE OF OUTPATIENT SURGERY:  January 04, 2004.   PREOPERATIVE DIAGNOSIS:  Senile nuclear cataract, left eye.   POSTOPERATIVE DIAGNOSIS:  Senile nuclear cataract, left eye.   NAME OF OPERATION:  Planned extracapsular cataract extraction -  phacoemulsification, primary insertion of posterior chamber intraocular lens  implant.   SURGEON:  Guadelupe Sabin, MD.   ASSISTANT:  Nurse.   ANESTHESIA:  Local 4% Xylocaine, 0.75% Marcaine, retrobulbar block with  Wydase added, topical Tetracaine, intraocular Xylocaine.  Anesthesia standby  required.  Patient given sodium Pentothal or Ditropan intravenously during  the period of retrobulbar injection.   OPERATIVE PROCEDURE:  After the patient was prepped and draped, a lid  speculum was inserted in the left eye.  The eye was turned downward and a  superior rectus traction suture placed.  Schiotz's tonometry was then  recorded at 5 to 7 scale units with a 5.5 gram weight.  A peritomy was  performed adjacent to the limbus from the 11 to 1 o'clock position.  The  corneoscleral junction was cleaned, and a corneoscleral groove made with a  45 degree super blade.  The anterior chamber was then entered with a 2.5 mm  diamond keratome at the 12 o'clock position, and the 15 degree blade at the  2:30 position.  Using a bent 26 gauge needle on an Ocucoat syringe, a  circular capsulorrhexis was begun and then completed with the Grabow  forceps.  Hydrodissection and hydrodelineation were performed using 1%  Xylocaine.  The 30 degree phacoemulsification tip was then inserted with  slow, controlled  emulsification of the lens nucleus; total ultrasonic time  recorded in the patient's chart.  After the nucleus was fragmented and  emulsified, the residual cortex was aspirated with the irrigation-aspiration  tip.  The posterior capsule appeared intact with a brilliant red fundus  reflex.  It was therefore elected to insert an [employer] SI-40  NB silicone 3-piece posterior chamber intraocular lens implant, diopter  strength +20.50.  This was inserted with the McDonald forceps into the  anterior chamber and then centered into the capsular bag using the [facility]  lens rotator.  The lens appeared to be well centered.  The Ocucoat and  Healon, which had been used during the procedure intermittently, were then  aspirated and replaced with balanced salt solution and Miochol ophthalmic  solution.  The operative incisions appeared to be self-sealing.  It was,  however, elected to place a 10-0 interrupted nylon suture across the 12  o'clock incision to ensure closure and to prevent endophthalmitis.  Maxitrol  ointment was instilled in the conjunctival cul-de-sac, and a light patch and  protector shield applied.  Duration of procedure and anesthesia  administration 45 minutes.  The patient tolerated the procedure well and was  taken to the outpatient recovery room.       HNJ/MEDQ  D:  01/30/2004  T:  01/30/2004  Job:  [id number]

## 2010-07-28 NOTE — Op Note (Signed)
[place]. [facility]  Patient:    Desiree [name], Desiree [name]                      MRN: [id number] Proc. Date: 04/21/99 Adm. Date:  [id number] Attending:  Emeterio Reeve                           Operative Report  PREOPERATIVE DIAGNOSIS:  Spondylolisthesis of L4 on L5, spondylosis L3-4 and herniated disk at L2-L3.  POSTOPERATIVE DIAGNOSIS:  Spondylolisthesis of L4 on L5, spondylosis L3-4 and herniated disk at L2-L3.  OPERATION PERFORMED:  L2-3, L3-4, L4-5 laminectomy, diskectomy and posterior lumbar interbody fusion with a Ray threaded fusion cage.  SURGEON:  Payton Doughty, M.D.  ASSISTANT:  Cristi Loron, M.D.  ANESTHESIA:  General endotracheal.  PREP:  Sterile Betadine prep and scrub with alcohol wipe.  COMPLICATIONS:  None.  INDICATIONS FOR PROCEDURE:  The patient is a 77 year old lady who had had a decompressive laminectomy by an orthopedic surgeon a few years ago.  Has a slip at 4-5 and a disk at 2-3.  DESCRIPTION OF PROCEDURE:  The patient was taken to the operating room, smoothly anesthetized and intubated, and placed prone on the operating table.  Following  shave, prep and drape in the usual sterile fashion, the old skin incision was reopened and extended superiorly and inferiorly.  The lamina of L2, L3, L4 and 5 were exposed bilaterally as were the transverse process of 2, 3, 4, and 5. Intraoperative x-ray confirmed correctness of level.  Starting with L4 it was found that there was bilateral pars defects at the decompression site and she had a grade 2 slip.  Removal of the loose posterior elements as well as the superior facet f L5 was accomplished.  At L3-4 there was severe degenerative disease.  Removing he lamina of L3 it was found that the dura on the right side came off with the laina. This was covered with thrombin, Gelfoam and there was minimal if any arachnoid disruption.  At L2-3, removal of the pars, lamina and inferior  facet of 2 and the superior facet of 3 revealed both the 2 and the 3 roots as they course around their pedicles.  There was a herniated disk at L2-3 worse on the left than on the right but present on both sides with compression of both L2 and L3 roots.  This was also removed.  Following complete decompression of all the nerve roots, Ray threaded  fusion cages were placed at each level.  At 3-4 and 4-5, 12 x 21 mm cages were placed and at 3-4, 14 x 21 mm cages were placed.  Intraoperative x-ray showed good placement of the cages.  They were packed with bone graft harvested from the facet joints.  The spondylolisthesis of L4 and L5 was completely reduced down to within 3 mm.  Having packed the cages, it was decided not to pursue pedicle fixation because alignment was good and the cages had a good bite into the bone.  The wound was irrigated and hemostasis assured.  The dura carefully inspected.  Any suspicious spots were covered with thrombin Gelfoam.  The defect on the right side under L3 was covered with thrombin Gelfoam and a piece of muscle.  The fascia was then reapproximated with 0 Vicryl in interrupted fashion.  Subcutaneous tissues  were reapproximated with 0 Vicryl in interrupted fashion.  Subcuticular tissues  were  reapproximated with 3-0 Vicryl in interrupted fashion.  The skin was closed with 3-0 nylon in running locked fashion.  Betadine Telfa dressing was applied nd made occlusive with Op-Site.  The patient returned to the recovery room. DD:  04/21/99 TD:  04/23/99 Job: [id number]

## 2010-07-28 NOTE — Progress Notes (Signed)
Subjective:    Patient ID: Desiree [name], female    DOB: Dec 10, 1920, 77 y.o.   MRN: [id number]  HPI The patient is here for annual Medicare wellness examination and management of other chronic and acute problems. In the interval since her last visit she has lost about 10lbs on a reduced sugar and starch diet. Her daughter reports a family concern for advancing memory loss. She does control BP with medication and DM with diet.   The risk factors are reflected in the social history.  The roster of all physicians providing medical care to patient - is listed in the Snapshot section of the chart.  Activities of daily living:  The patient is 100% inedpendent in all ADLs: dressing, toileting, feeding.  Home safety : The patient has smoke detectors in the home. They wear seatbelts.No firearms at home . There is no violence in the home.  She has had several falls and has increased risk due to her age and comorbidities. She is using a cane for balance and the family has removed obstacles from the home.  Also, she now has a Charity fundraiser alert system since she lives alone.   There is no risks for hepatitis, STDs or HIV. There is no   history of blood transfusion. They have no travel history to infectious disease endemic areas of the world.  The patient has seen their dentist in the last six month. They have seen their eye doctor in the last year. They admit to having some hearing difficulty but have not had audiologic testing in the last year.  They do not  have excessive sun exposure. Discussed the need for sun protection: hats, long sleeves and use of sunscreen if there is significant sun exposure.   Diet: the importance of a healthy diet is discussed. They do have a healthy diet.  Exercise: no regualr exercise program.   Past Medical History  Diagnosis Date  . Dementia   . Diabetes mellitus     type  II- diet controlled  . Hyperlipidemia   . Hypertension   . Osteoarthritis     hands  . Allergic  rhinitis   . Anemia   . GERD (gastroesophageal reflux disease)   . Hypothyroidism   . Urinary incontinence     stress   Past Surgical History  Procedure Date  . Carpal tunnel release     bilateral  . Cataract extraction 2006    left  . Abdominal hysterectomy     nonmalignant reasons  . Vein ligation and stripping     left leg  . Right eye surgery 7-00  . Lumbar fusion 3-01   Family History  Problem Relation Age of Onset  . Other Mother     CVA/ grief  . Heart attack Father   . Heart attack Brother 47  . Cancer Neg Hx     breast or colon   History   Social History  . Marital Status: Widowed    Spouse Name: N/A    Number of Children: N/A  . Years of Education: N/A   Occupational History  . homemaker    Social History Main Topics  . Smoking status: Never Smoker   . Smokeless tobacco: Never Used  . Alcohol Use: No  . Drug Use: No  . Sexually Active: No   Other Topics Concern  . Not on file   Social History Narrative   married 30 years -divorced; remarried 19 -widowed '83 . 1 daughter 1 son.  1 grandchild, 2 great grandchildren. 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 Terminal care issues: patient clearly states, daughter present, that she would not want heroic or futile care, e.g. CPR, mechanical vent. support, etc.         Review of Systems Review of Systems  Constitutional:  Negative for fever, chills, activity change and unexpected weight change.  HENT: Poitive for hearing loss,negative for ear pain, congestion, neck stiffness and postnasal drip.   Eyes: Negative for pain, discharge and visual disturbance.  Respiratory: Negative for chest tightness and wheezing.   Cardiovascular: Negative for chest pain and palpitations.       [No decreased exercise tolerance Gastrointestinal: No change in bowel habit. No bloating or gas. No reflux or indigestion Genitourinary: Negative for urgency, frequency, flank pain  and difficulty urinating.  Musculoskeletal: Negative for myalgias, back pain, arthralgias and gait problem.  Neurological: Negative for dizziness, tremors, weakness and headaches.  Hematological: Negative for adenopathy.  Psychiatric/Behavioral: Negative for behavioral problems and dysphoric mood.       Objective:   Physical Exam Physical Exam  Constitutional: She is oriented to person, place, and time. Vital signs are normal. She appears well-developed and well-nourished.    Elderly WW  in no distress  HENT:  Head: Normocephalic and atraumatic.  Right Ear: External ear normal.  Left Ear: External ear normal.       EACs and TMs normal  Eyes: Conjunctivae and EOM are normal. Pupils are equal, round, and reactive to light. No scleral icterus.  Neck: Normal range of motion. Neck supple. No JVD present. No thyromegaly present.  Cardiovascular: Normal rate, regular rhythm and normal heart sounds.  Exam reveals no friction rub.   No murmur heard.      Radial and Dorsalis Pedis pulses normal  Pulmonary/Chest: Effort normal and breath sounds normal. No respiratory distress. She has no wheezes. She has no rales.       No chest wall deformity with normal A-P diameter. Breast exam: deferred by patient Abdominal: Soft. Bowel sounds are normal. She exhibits no distension and no mass. There is no tenderness. There is no guarding.       No hepatosplenomegaly  Genitourinary:       Exam deferred to age and lack of symptoms  Musculoskeletal: Normal range of motion. She exhibits no edema and no tenderness.       No joint swelling, no synovial thickening, no deformity of the small, medium or large joints.  Lymphadenopathy:    She has no cervical adenopathy.  Neurological: She is alert and oriented to person, place, and time. She has normal reflexes. No cranial nerve deficit. Coordination normal.       Normal facial symmetry, normal gross motor strength throughout, no tremor or cogwheeling, normal  rapid finger movement.  Skin: Skin is warm and dry. No rash noted. No erythema.       No suspicious lesions. Normal turgor. Nails are normal.  Psychiatric: She has a normal mood and affect. Her behavior is normal. Thought content normal.       Abbreviated MMSE: oriented day,date, year; word recall 2/3; normal clock face exercise.      Lab Results  Component Value Date   WBC 8.3 07/28/2010   HGB 11.1* 07/28/2010   HCT 33.0* 07/28/2010   PLT 202.0 07/28/2010   CHOL 83 07/28/2010   TRIG 30.0 07/28/2010   HDL 40.20 07/28/2010   ALT 18 07/28/2010   AST 23 07/28/2010  NA 140 07/28/2010   K 5.0 07/28/2010   CL 106 07/28/2010   CREATININE 0.8 07/28/2010   BUN 20 07/28/2010   CO2 28 07/28/2010   TSH 1.87 07/28/2010   HGBA1C 6.4 07/28/2010   Lab Results  Component Value Date   LDLCALC 37 07/28/2010      Assessment & Plan:  1. Weight loss - 10 lbs over 15 months with no symptoms. Suspect this is due to better diet.  2. Hyperlipidemia - patient with no h/o CAD or CVA, follows a good diet. Discussed the relative value of continued treatment in terms of life-span gained which at 30 is measured in days.  Plan - OK to discontinue lipid therapy           Continue prudent diet.  3. Diabetes - for A1C today   Addendeum:  Lab Results  Component Value Date   HGBA1C 6.4 07/28/2010   Good control, no need to add medications  4. Hypertension - good control on present medications. For routine labs today.  5. Dementia - seems to be doing OK with good performance on abbreviated MMSE  Plan - continued supportive care - calendars, reminders, remaining active. Do not see indication to add additional medications.  6. Health maintenance - interval history benign. PHysical exam OK. Lab results are within normal range. Patient encouraged to have mammogram every 2-3 years. Reviewed End of Life Care decisions as previously documented - no change desired. Patient has never had colonoscopy but at 22 screening is  not recommended. Declines - singles vaccine; had pneumonia vaccine several years ago.  In summary - a very pleasant woman who does seem medically stable at this time. She is asked to return as needed orin 1 year.

## 2010-07-28 NOTE — H&P (Signed)
NAMELIZZETE, GOUGH NO.:  [id number]   MEDICAL RECORD NO.:  [id number]          PATIENT TYPE:  OIB   LOCATION:  2859                         FACILITY:  [facility]   PHYSICIAN:  Guadelupe Sabin, M.D.DATE OF BIRTH:  07/18/1920   DATE OF ADMISSION:  01/04/2004  DATE OF DISCHARGE:  01/04/2004                                HISTORY & PHYSICAL   This was a planned outpatient surgical admission of this 77 year old female  admitted for cataract implant surgery of the left eye.   HISTORY OF PRESENT ILLNESS:  This patient has been followed in my office for  routine eye care since May 26, 1998.  At that time, the patient stated she  had previous cataract implant by Dr. Doloris Hall on January 06, 1998.  The  patient also had seen Dr. Alan Mulder postoperatively in June 2000 for a  retinal evaluation.  The patient stated she had an early cataract in the  left eye and had bleeding and drainage after her cataract implant surgery of  the right eye.  Initial examination in my office on May 26, 1998, revealed  a visual acuity of 20/100 right eye, 20/50 left eye without correction, and  20/40 right eye, 20/25 with correction.  Applanation tonometry 25 mm each  eye.  Slit lamp examination showed a clear cornea with corneal dystrophy  gutta and a stromal haze in the right eye.  The anterior chamber was deep  and clear.  A posterior chamber implant was present in the right eye and a  moderate nuclear sclerotic cataract in the left eye.  Dilated detailed  fundus examination with indirect ophthalmoscopy revealed a clear vitreous,  attached retina, with normal optic nerve.  Disk/cup ratio 0.4 right eye, 0.5  left eye.  The retinal blood vessels were tortuous and attenuated in the  right eye and left eye.  Biomicroscopy revealed a clear vitreous, attached  retina.  With high magnification, there appeared to be several  microaneurysms or punctate hemorrhages associated with venous  congestion  with macular edema.   The left eye showed a clear macula with several micropunctate hemorrhages or  microaneurysms.  It was felt the patient had venostasis retinopathy or  macular telangiectasia.  The patient was given Miochol 128 for her corneal  dystrophy, and her previous Pred Forte discontinued.  The patient continued  to be followed with refraction, no help in improving acuity.  The patient  was seen on April 05, 1999, at which time her vision was recorded at 20/30  right eye, 20/25 left eye.  When she returned in March 2005, her vision was  noted to be reduced to finger counting with best correction right eye, 20/40  left eye.  It was felt that there was a revascularization of the right optic  nerve with probable previous retinal vein occlusion.  There appeared to be  old yellow dot in the inferior macular area.  The left eye showed a clear  vitreous, attached retina, with normal macular area.   Vision continued to deteriorate in the left eye to 20/100, and it was  elected, therefore, to proceed with cataract implant surgery.  The patient  was given oral discussion and printed information concerning the procedure  and its possible complications.  She signed and informed consent, and  arrangements were made for her outpatient admission at this time.   PAST MEDICAL HISTORY:  The patient is under the care of Dr. Idamae Schuller A. Tower  at [facility].  Dr. Milinda Antis feels the patient is in satisfactory condition  for the proposed surgery.  Dr. Milinda Antis notes the patient's stable chronic  medical problems including hypertension.   CURRENT MEDICATIONS:  Include Synthroid, K-Dur, hydrochlorothiazide,  Singulair, Astelin NS, Zyrtec, Avapro, Tums, vitamin C, and Norvasc.   ALLERGIES:  She is said to be ZESTRIL intolerant and have gastrointestinal  symptoms with VIOXX.   REVIEW OF SYSTEMS:  No current cardiorespiratory complaints.   PHYSICAL EXAMINATION:  VITAL SIGNS:  As recorded on  admission, blood  pressure 126/68, respirations 16, heart rate 82, temperature 96.1.  GENERAL:  The patient is a pleasant 77 year old white female with limited  vision.  HEENT:  Eyes:  Ocular exam as noted above.  CHEST:  Lungs clear to percussion and auscultation.  HEART:  Normal sinus rhythm.  No cardiomegaly, no murmurs.  ABDOMEN:  Negative.  EXTREMITIES:  Negative.   ADMISSION DIAGNOSES:  1.  Senile nuclear cataract, left eye.  2.  Old central retinal vein occlusion, right eye.  3.  Pseudophakia, right eye.   SURGICAL PLAN:  Cataract implant surgery, left eye.  The patient was given  oral discussion and printed information concerning the procedure and its  possible complications.  She signed an informed consent.       HNJ/MEDQ  D:  01/30/2004  T:  01/30/2004  Job:  [id number]

## 2010-07-28 NOTE — Op Note (Signed)
[place]  Patient:    DEATRICE, SPANBAUER                      MRN: [id number] Proc. Date: 08/13/00 Adm. Date:  [id number] Attending:  Marcene Corning                           Operative Report  PREOPERATIVE DIAGNOSIS: 1. Right shoulder impingement. 2. Right shoulder rotator cuff tear.  POSTOPERATIVE DIAGNOSIS: 1. Right shoulder impingement. 2. Right shoulder rotator cuff tear.  OPERATION PERFORMED: 1. Right shoulder arthroscopic acromioplasty. 2. Right shoulder arthroscopic debridement.  ANESTHESIA:  General and regional.  ATTENDING SURGEON:  Lubertha Basque. Jerl Santos, M.D.  ASSISTANT:  Lindwood Qua, P.A.  INDICATIONS FOR PROCEDURE:  The patient is a 77 year old woman with several years of right dominant shoulder pain.  This has persisted despite oral anti-inflammatories, activity restriction and injection x 2.  At this point she is offered operative intervention to consist of an arthroscopy.  The procedure was discussed with the patient and informed operative consent was obtained after discussion of possible complications of reaction to anesthesia and infection.  DESCRIPTION OF PROCEDURE:  The patient was taken to an operating suite where general anesthetic was applied without difficulty.  She was also given a regional block in the preanesthesia area.  She was then positioned in beach chair position and prepped and draped in normal sterile fashion.  After administration of preop intravenous antibiotics, an arthroscopy of the right shoulder was performed through a total of three portals.  Glenohumeral joint showed no significant degenerative change and the biceps tendon was intact though partially torn.  A debridement of the structure was performed back to stable tissue.  She had a massive rotator cuff tear involving the entire rotator cuff.  This was fairly mobile but the tissue was poor and it was decided that the best mode of treatment  was going to be debridement.  A thorough debridement was performed taking the cuff back to the superior aspect of the glenoid and about the level of the biceps.  The acromioclavicular joint was not addressed but she had no pain at that location.  The shoulder was thoroughly irrigated followed by placement of Marcaine with epinephrine and morphine.  Simple sutures of nylon were used to loosely reapproximate the portals followed by Adaptic and a dry gauze dressing with tape.   Estimated blood loss and intraoperative fluids can be obtained from Anesthesia records.  DISPOSITION:  The patient was extubated in the operating room and taken to the recovery room in stable condition.  Plans were for her to stay overnight for observation and pain control with probable discharge home in the morning. DD:  08/13/00 TD:  08/13/00 Job: [id number]

## 2010-07-28 NOTE — Discharge Summary (Signed)
[place]  Patient:    Desiree [name], Desiree [name]                      MRN: [id number] Adm. Date:  [id number] Disc. Date: 06/01/99 Attending:  Faith Rogue T Dictator:   Mcarthur Rossetti. Angiulli, P.A. CC:         Faith Rogue, M.D.             Roxy Manns, M.D. [facility]             Payton Doughty, M.D.                           Discharge Summary  DISCHARGE DIAGNOSES:  1. Lumbar fusion, L2-3, 3-4, 4-5, on April 21, 1999.  2. Deconditioning.  3. Decreased nutritional storage, improved.  4. Hypotension, resolved.  5. Urinary incontinence.  6. Hypothyroidism.  7. Non-insulin-dependent diabetes mellitus.  HISTORY OF PRESENT ILLNESS:  This 77 year old female admitted March 9 with history of lumbar fusion, February 9, per Dr. Trey Sailors.  Discharged February 28 home with general decline in mobility and decreased appetite.  On evaluation, x-rays of her back per Dr. Channing Mutters with good position.  BUN 35.  Placed on intravenous fluids. Still with some orthostatic changes and blood pressure monitored.  No chest pain or shortness of breath.  Therapies initiated.  Ambulating 40 feet, endurance limited. Blood pressure medications had been discontinued due to some orthostatic changes. Latest hemoglobin 13.4.  Her appetite continued to improve.  She is admitted for comprehensive rehab program.  PAST MEDICAL HISTORY:  See discharge diagnoses.  PAST SURGICAL HISTORY:  Cataract surgery.  ALLERGIES:  SULFA and CODEINE.  PRIMARY M.D.:  Dr. Milinda Antis of [facility].  MEDICATIONS PRIOR TO ADMISSION:  1. Thyroid 75 mg daily.  2. Hydrochlorothiazide 70-50 mg daily.  3. Premarin 0.625 mg daily.  4. Sular 10 mg daily.  5. Claritin 10 mg as needed.  6. Norvasc 5 mg daily.  7. Vitamin E as needed.  8. Aspirin 81 mg daily for prophylaxis.  SOCIAL HISTORY:  Lives alone in [place].  Independent prior to back surgery.  One-level home without steps to entry.  Daughter in  [place].  HOSPITAL COURSE:  The patient did well while on subacute rehab services with therapies initiated daily.  The following issues were followed during patients rehab course.  Pertaining to Ms. Farmers lumbar fusion of February 2001, continued to progress  nicely.  Recent follow-up imaging of surgical site showed good position.  Her overall conditioning continued to improve.  Her endurance progressed nicely. She was ambulating independently on the rehab unit wearing her back brace when out f bed.  She was using narcotics on a limited basis.  Her appetite continued to improve.  She had no nausea or vomiting.  There was no orthostatic hypotension.  All her recent blood pressure medications had been discontinued and recently her hydrochlorothiazide had been resumed at 25 mg increased to 50 mg on May 31, 1999. She would need followup with her primary M.D.  Her blood sugars remained controlled on diet alone.  Her latest hemoglobin A1C was 5.9.  She was treated for a 35,000 colony E. coli urinary tract infection with a 7-day course of Levaquin, which was completed May 31, 1999.  She was having some bladder incontinence.  Bladder scans were to follow.  She denied any dysuria or hematuria.  Overall for her functional ability  she was ambulating extended distances with a walker, as well as hand-held assistance.  Neurontin had been established for some lower extremity leg pain with good results.  She denied any bowel difficulties.  Family teaching was completed. Her daughter remained quite supportive.  PLAN:  To be discharged to home.  Her daughter would remain in [place] for a  short time until her mother could be more independent.  Plan was for Home Health therapies as advised.  LATEST LABORATORY:  Hemoglobin 10.8, hematocrit 32.1, sodium 137, potassium 3.8, BUN 15, creatinine 0.7.  DISCHARGE MEDICATIONS:  1. Trinsicon twice daily.  2. Multivitamin daily.  3.  Synthroid 75 mcg daily.  4. Premarin 0.625 mg daily.  5. Claritin 10 mg daily.  6. Neurontin 300 mg 3 times daily.  7. Hydrochlorothiazide 50 mg daily.  8. Tylenol as needed.  ACTIVITY:  As tolerated with back brace when out of bed.  DIET:  An 1800 calorie ADA.  SPECIAL INSTRUCTIONS:  No driving.  Home therapies as advised per rehab services.  FOLLOWUP:  Follow up with Dr. Trey Sailors, neurosurgery; Dr. Milinda Antis, primary care, [facility], for resuming blood pressure medications as advised.DD: 05/31/99 TD:  05/31/99 Job: [id number]

## 2010-07-28 NOTE — Discharge Summary (Signed)
[place]  Patient:    Desiree [name], Desiree [name]                      MRN: [id number] Adm. Date:  [id number] Disc. Date: [id number] Attending:  Faith Rogue T                           Discharge Summary  ADMITTING DIAGNOSIS:  Dehydration, general debilitation.  DISCHARGE DIAGNOSIS:  Dehydration, general debilitation.  OPERATIVE PROCEDURES:  None.  COMPLICATIONS:  None.  HISTORY OF PRESENT ILLNESS:  A 77 year old right-handed white lady had a fusion at L2-3, L3-4, and L4-5 a month prior to admission.  She had a fairly rocky hospital course and developed orthostatic hypotension and anemia.  She was treated with physical therapy and was eventually discharged home but had sinking spells at home.  She was brought to the ER with general fatigue.  PAST MEDICAL HISTORY:  Otherwise unremarkable except for the fusion.  PHYSICAL EXAMINATION:  HEENT:  Exam within normal limits.  NECK:  She had good range of motion of the neck.  CHEST:  Clear.  CARDIAC:  Regular rhythm.  ABDOMEN:  Nontender with no hepatosplenomegaly.  EXTREMITIES:  Without clubbing or cyanosis.  Peripheral pulses were good.  NEUROLOGIC:  Awake, alert and oriented.  Cranial nerves II-XII intact.  Motor exam with 5/5 strength throughout the upper and lower extremities.  SKIN:  Turgor was very poor, dry in the mouth.  She did not have any anemia.  HOSPITAL COURSE:  She was admitted and hydrated and, after that, seemed to do much better.  Because of the difficulty she was having getting around at home, she was visited by the rehab service.  She was transferred to the rehab service on May 24, 1999, at which time she would receive more intensive physical therapy.  At the time of her transfer, she was awake, alert and oriented, her vital signs were stable, and strength was full. DD:  08/09/99 TD:  08/14/99 Job: [id number]

## 2010-07-30 ENCOUNTER — Encounter: Payer: Self-pay | Admitting: Internal Medicine

## 2010-07-31 ENCOUNTER — Encounter: Payer: Self-pay | Admitting: Internal Medicine

## 2011-05-10 ENCOUNTER — Other Ambulatory Visit: Payer: Self-pay

## 2011-05-10 MED ORDER — AMLODIPINE BESYLATE 10 MG PO TABS: 10.0000 mg | ORAL_TABLET | Freq: Every day | ORAL | Status: DC

## 2011-05-10 MED ORDER — LEVOTHYROXINE SODIUM 100 MCG PO TABS: 100.0000 ug | ORAL_TABLET | Freq: Every day | ORAL | Status: DC

## 2011-05-10 MED ORDER — POTASSIUM CHLORIDE ER 10 MEQ PO TBCR: 10.0000 meq | EXTENDED_RELEASE_TABLET | Freq: Three times a day (TID) | ORAL | Status: DC

## 2011-05-10 MED ORDER — HYDROCHLOROTHIAZIDE 50 MG PO TABS: 50.0000 mg | ORAL_TABLET | Freq: Every day | ORAL | Status: DC

## 2011-05-10 MED ORDER — LOSARTAN POTASSIUM 100 MG PO TABS: 100.0000 mg | ORAL_TABLET | Freq: Every day | ORAL | Status: DC

## 2011-05-15 ENCOUNTER — Other Ambulatory Visit: Payer: Self-pay | Admitting: *Deleted

## 2011-05-15 MED ORDER — AMLODIPINE BESYLATE 10 MG PO TABS: 10.0000 mg | ORAL_TABLET | Freq: Every day | ORAL | Status: DC

## 2011-05-15 MED ORDER — HYDROCHLOROTHIAZIDE 50 MG PO TABS: 50.0000 mg | ORAL_TABLET | Freq: Every day | ORAL | Status: DC

## 2011-05-15 MED ORDER — LEVOTHYROXINE SODIUM 100 MCG PO TABS: 100.0000 ug | ORAL_TABLET | Freq: Every day | ORAL | Status: DC

## 2011-05-15 MED ORDER — LOSARTAN POTASSIUM 100 MG PO TABS: 100.0000 mg | ORAL_TABLET | Freq: Every day | ORAL | Status: DC

## 2011-05-15 MED ORDER — POTASSIUM CHLORIDE ER 10 MEQ PO TBCR: 10.0000 meq | EXTENDED_RELEASE_TABLET | Freq: Three times a day (TID) | ORAL | Status: DC

## 2011-05-15 NOTE — Telephone Encounter (Signed)
Done, new mail order insurance/SLS

## 2011-06-13 ENCOUNTER — Other Ambulatory Visit (INDEPENDENT_AMBULATORY_CARE_PROVIDER_SITE_OTHER): Payer: Medicare Other

## 2011-06-13 ENCOUNTER — Ambulatory Visit (INDEPENDENT_AMBULATORY_CARE_PROVIDER_SITE_OTHER): Payer: Medicare Other | Admitting: Internal Medicine

## 2011-06-13 ENCOUNTER — Encounter: Payer: Self-pay | Admitting: Internal Medicine

## 2011-06-13 VITALS — BP 114/62 | HR 96 | Temp 97.8°F | Resp 14 | Ht 62.0 in | Wt 126.8 lb

## 2011-06-13 DIAGNOSIS — F068 Other specified mental disorders due to known physiological condition: Secondary | ICD-10-CM

## 2011-06-13 DIAGNOSIS — E119 Type 2 diabetes mellitus without complications: Secondary | ICD-10-CM

## 2011-06-13 DIAGNOSIS — E785 Hyperlipidemia, unspecified: Secondary | ICD-10-CM

## 2011-06-13 DIAGNOSIS — D649 Anemia, unspecified: Secondary | ICD-10-CM

## 2011-06-13 DIAGNOSIS — M1990 Unspecified osteoarthritis, unspecified site: Secondary | ICD-10-CM

## 2011-06-13 DIAGNOSIS — Z0000 Encounter for general adult medical examination without abnormal findings: Secondary | ICD-10-CM

## 2011-06-13 DIAGNOSIS — E039 Hypothyroidism, unspecified: Secondary | ICD-10-CM

## 2011-06-13 DIAGNOSIS — I10 Essential (primary) hypertension: Secondary | ICD-10-CM

## 2011-06-13 LAB — HEPATIC FUNCTION PANEL
ALT: 16 U/L (ref 0–35)
AST: 19 U/L (ref 0–37)
Bilirubin, Direct: 0.1 mg/dL (ref 0.0–0.3)
Total Bilirubin: 0.3 mg/dL (ref 0.3–1.2)
Total Protein: 7.2 g/dL (ref 6.0–8.3)

## 2011-06-13 LAB — COMPREHENSIVE METABOLIC PANEL
AST: 19 U/L (ref 0–37)
Alkaline Phosphatase: 96 U/L (ref 39–117)
BUN: 20 mg/dL (ref 6–23)
Glucose, Bld: 108 mg/dL — ABNORMAL HIGH (ref 70–99)
Sodium: 139 mEq/L (ref 135–145)
Total Bilirubin: 0.3 mg/dL (ref 0.3–1.2)

## 2011-06-13 LAB — HEMOGLOBIN A1C: Hgb A1c MFr Bld: 6.1 % (ref 4.6–6.5)

## 2011-06-13 LAB — TSH: TSH: 5.48 u[IU]/mL (ref 0.35–5.50)

## 2011-06-13 MED ORDER — SIMVASTATIN 20 MG PO TABS: 20.0000 mg | ORAL_TABLET | Freq: Every day | ORAL | Status: DC

## 2011-06-13 MED ORDER — OMEPRAZOLE 40 MG PO CPDR: 40.0000 mg | DELAYED_RELEASE_CAPSULE | Freq: Every day | ORAL | Status: DC

## 2011-06-13 MED ORDER — LOSARTAN POTASSIUM 100 MG PO TABS: 100.0000 mg | ORAL_TABLET | Freq: Every day | ORAL | Status: DC

## 2011-06-13 MED ORDER — AMLODIPINE BESYLATE 10 MG PO TABS: 10.0000 mg | ORAL_TABLET | Freq: Every day | ORAL | Status: DC

## 2011-06-13 MED ORDER — LEVOTHYROXINE SODIUM 100 MCG PO TABS: 100.0000 ug | ORAL_TABLET | Freq: Every day | ORAL | Status: DC

## 2011-06-13 MED ORDER — BRIMONIDINE TARTRATE 0.15 % OP SOLN: 2.0000 [drp] | Freq: Every day | OPHTHALMIC | Status: DC

## 2011-06-13 MED ORDER — HYDROCHLOROTHIAZIDE 50 MG PO TABS: 50.0000 mg | ORAL_TABLET | Freq: Every day | ORAL | Status: DC

## 2011-06-13 MED ORDER — POTASSIUM CHLORIDE ER 10 MEQ PO TBCR: 10.0000 meq | EXTENDED_RELEASE_TABLET | Freq: Three times a day (TID) | ORAL | Status: DC

## 2011-06-13 NOTE — Progress Notes (Signed)
Subjective:    Patient ID: Desiree [name], female    DOB: Jun 26, 1920, 78 y.o.   MRN: [id number]  HPI The patient is here for annual Medicare wellness examination and management of other chronic and acute problems.   The risk factors are reflected in the social history.  The roster of all physicians providing medical care to patient - is listed in the Snapshot section of the chart.  Activities of daily living:  The patient is 100% inedpendent in all ADLs: dressing, toileting, feeding. She does not drive.  Home safety : The patient has smoke detectors in the home. Falls - no falls. House is fall safe. Grab bars in tub,uses shower chair.They wear seatbelts. No firearms at home  There is no violence in the home.   There is no risks for hepatitis, STDs or HIV. There is no  history of blood transfusion. They have no travel history to infectious disease endemic areas of the world.  The patient has seen their dentist in the last six month. They have seen their eye doctor in the last year. They admit to any hearing difficulty and have not had audiologic testing in the last year.  They do not  have excessive sun exposure. Discussed the need for sun protection: hats, long sleeves and use of sunscreen if there is significant sun exposure.   Diet: the importance of a healthy diet is discussed. They do have a healthy diet.  The patient has no regular exercise program.  The benefits of regular aerobic exercise were discussed.  Depression screen: there are no signs or vegative symptoms of depression- irritability, change in appetite, anhedonia, sadness/tearfullness.  Cognitive assessment: the patient manages all their financial and personal affairs and is actively engaged.   The following portions of the patient's history were reviewed and updated as appropriate: allergies, current medications, past family history, past medical history,  past surgical history, past social history  and problem  list.  Vision, hearing, body mass index were assessed and reviewed.   During the course of the visit the patient was educated and counseled about appropriate screening and preventive services including : fall prevention , diabetes screening, nutrition counseling, colorectal cancer screening, and recommended immunizations.  Past Medical History  Diagnosis Date  . Dementia   . Diabetes mellitus     type  II- diet controlled  . Hyperlipidemia   . Hypertension   . Osteoarthritis     hands  . Allergic rhinitis   . Anemia   . GERD (gastroesophageal reflux disease)   . Hypothyroidism   . Urinary incontinence     stress   Past Surgical History  Procedure Date  . Carpal tunnel release     bilateral  . Cataract extraction 2006    left  . Abdominal hysterectomy     nonmalignant reasons  . Vein ligation and stripping     left leg  . Right eye surgery 7-00  . Lumbar fusion 3-01   Family History  Problem Relation Age of Onset  . Other Mother     CVA/ grief  . Heart attack Father   . Heart attack Brother 47  . Cancer Neg Hx     breast or colon   History   Social History  . Marital Status: Widowed    Spouse Name: N/A    Number of Children: N/A  . Years of Education: N/A   Occupational History  . homemaker    Social History Main Topics  .  Smoking status: Former Games developer  . Smokeless tobacco: Never Used  . Alcohol Use: No  . Drug Use: No  . Sexually Active: No   Other Topics Concern  . Not on file   Social History Narrative   married 30 years -divorced; remarried 20 -widowed '83 . 1 daughter 1 son. 1 grandchild, 2 great grandchildren. 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 Terminal care issues: patient clearly states, daughter present, that she would not want heroic or futile care, e.g. CPR, mechanical vent. support, etc.   Current Outpatient Prescriptions on File Prior to Visit  Medication Sig Dispense Refill  .  amLODipine (NORVASC) 10 MG tablet Take 1 tablet (10 mg total) by mouth daily.  90 tablet  3  . Ascorbic Acid (VITAMIN C) 1000 MG tablet Take 1,000 mg by mouth daily.        Marland Kitchen aspirin 81 MG tablet Take 81 mg by mouth daily.        . calcium carbonate (TUMS) 500 MG chewable tablet Chew 2 tablets by mouth daily.        . fexofenadine (ALLEGRA) 180 MG tablet Take 180 mg by mouth every morning.      . hydrochlorothiazide (HYDRODIURIL) 50 MG tablet Take 1 tablet (50 mg total) by mouth daily.  90 tablet  3  . levothyroxine (SYNTHROID, LEVOTHROID) 100 MCG tablet Take 1 tablet (100 mcg total) by mouth daily.  90 tablet  3  . losartan (COZAAR) 100 MG tablet Take 1 tablet (100 mg total) by mouth daily.  90 tablet  3  . multivitamin (THERAGRAN) per tablet Take 1 tablet by mouth daily.        . naproxen sodium (ANAPROX) 220 MG tablet Take 220 mg by mouth daily.        . potassium chloride (KLOR-CON 10) 10 MEQ tablet Take 1 tablet (10 mEq total) by mouth 3 (three) times daily.  90 tablet  3  . vitamin E (VITAMIN E) 1000 UNIT capsule Take 1,000 Units by mouth daily.        . Wheat Dextrin (BENEFIBER) TABS Take by mouth daily.        . nepafenac (NEVANAC) 0.1 % ophthalmic suspension 3 drops 2 (two) times daily.        Marland Kitchen omeprazole (PRILOSEC) 40 MG capsule Take 1 capsule (40 mg total) by mouth daily.  90 capsule  3      Review of Systems Constitutional:  Negative for fever, chills, activity change and unexpected weight change.  HEENT:  Negative for hearing loss, ear pain, congestion, neck stiffness and postnasal drip. Negative for sore throat or swallowing problems. Negative for dental complaints.   Eyes: Negative for vision loss or change in visual acuity.  Respiratory: Negative for chest tightness and wheezing. Negative for DOE.   Cardiovascular: Negative for chest pain or palpitations. No decreased exercise tolerance Gastrointestinal: No change in bowel habit. No bloating or gas. No reflux or  indigestion Genitourinary: Negative for urgency, frequency, flank pain and difficulty urinating.  Musculoskeletal: Negative for myalgias, back pain, arthralgias and gait problem.  Neurological: Negative for dizziness, tremors, weakness and headaches.  Hematological: Negative for adenopathy.  Psychiatric/Behavioral: Negative for behavioral problems and dysphoric mood.       Objective:   Physical Exam Filed Vitals:   06/13/11 1341  BP: 114/62  Pulse: 96  Temp: 97.8 F (36.6 C)  Resp: 14   Wt Readings from Last 3 Encounters:  06/13/11 126  lb 12 oz (57.493 kg)  07/28/10 131 lb (59.421 kg)  04/28/09 141 lb (63.957 kg)   Gen'l- Thin older white women in no distress who carries her 89 years well HEENT- Hearing aids in place, native dentition in good repair, C&S clear, fundi- deferred to opthal Neck- supple, no thyromegaly Nodes - negative cervical/supraclavicular regions Chest - no deformity except mild kyphosis Cor - 2+ radial pulse, no JVD, RRR Abd - BS+, soft Ext - no deformity Neuro - HOH, alert and oriented, normal cognition-good sense of humor, normal motor strength, normal gait with a cane. Derm- clear face, neck, arms.  Lab Results  Component Value Date   WBC 8.3 07/28/2010   HGB 11.1* 07/28/2010   HCT 33.0* 07/28/2010   PLT 202.0 07/28/2010   GLUCOSE 108* 06/13/2011   CHOL 83 07/28/2010   TRIG 30.0 07/28/2010   HDL 40.20 07/28/2010   LDLCALC 37 07/28/2010        ALT 16 06/13/2011   AST 19 06/13/2011        NA 139 06/13/2011   K 4.6 06/13/2011   CL 102 06/13/2011   CREATININE 0.7 06/13/2011   BUN 20 06/13/2011   CO2 25 06/13/2011   TSH 5.48 06/13/2011   HGBA1C 6.1 06/13/2011        Assessment & Plan:

## 2011-06-15 DIAGNOSIS — Z0000 Encounter for general adult medical examination without abnormal findings: Secondary | ICD-10-CM | POA: Insufficient documentation

## 2011-06-15 NOTE — Assessment & Plan Note (Signed)
She is reasonable comfortable and not limited in her ADLs.

## 2011-06-15 NOTE — Assessment & Plan Note (Signed)
BP Readings from Last 3 Encounters:  06/13/11 114/62  07/28/10 124/66  04/28/09 118/72   Great control. Will continue present medications

## 2011-06-15 NOTE — Assessment & Plan Note (Addendum)
Interval history is stable. Physical exam,limited, is normal. Lab results are fine. She is due for pneumonia vaccine.  ACP - DNR/DNI, no heroics  In summary - a very nice woman who appears to be medically stable. She will return as needed or in 6 months

## 2011-06-15 NOTE — Assessment & Plan Note (Signed)
Lab Results  Component Value Date   TSH 5.48 06/13/2011   Good control on present medication. NO changes proposed.

## 2011-06-15 NOTE — Assessment & Plan Note (Signed)
Given advanced age and absence of cardiovascular disease symptoms the mortality benefit of continued treatment is low. Medication has been stopped.

## 2011-06-15 NOTE — Assessment & Plan Note (Signed)
Mild anemia with no evidence of active blood loss.  Plan - diet that includes iron rich foods: raisins, leafy greens, lean beef.

## 2011-06-15 NOTE — Assessment & Plan Note (Signed)
Excellent blood sugar by A1C.   Plan - continue life-style management w/o medications

## 2011-06-15 NOTE — Assessment & Plan Note (Signed)
Mild memory loss problem. She remains I-ADLs. Family is very supportive. No indication for medications

## 2011-08-17 ENCOUNTER — Encounter: Payer: Self-pay | Admitting: Internal Medicine

## 2011-08-24 ENCOUNTER — Telehealth: Payer: Self-pay | Admitting: Internal Medicine

## 2011-08-24 DIAGNOSIS — K623 Rectal prolapse: Secondary | ICD-10-CM

## 2011-08-24 NOTE — Telephone Encounter (Signed)
Caller: Dale/Child; PCP: Illene Regulus; CB#: [phone];  Call regarding a protrusion from rectum; sx started March 2013; sx have become worse; pt just told daughter this week; protrusion is slimmy and coming through clothes; the protrusion is there constantly; can push it back in but doesn't stay; no pain; Triaged per Rectal Symptoms Guideline; Homecare d/t all other situations; office called and instructed to send to ER; daughter aware and states that pt refusing ER; pt has an appt on 08/29/11; and daughter will try to get her to ER but pt is refusing

## 2011-08-24 NOTE — Telephone Encounter (Signed)
Spoke with patient daughter, Amada Jupiter, related you message to her about her mom. States her mom says their is no pain now but is adamant of not goint to E.R. Or having any surgery. Informed daughter that [facility] would notify her of date and time for [facility] apptl. And stated that patient would keep appt. With Dr. Debby Bud on Wed.

## 2011-08-24 NOTE — Telephone Encounter (Signed)
Sounds like a rectocele or prolapse rectum. IF not bleeding or painful does not need emergent visit. Will need to see general surgery. [facility] notified to set up visit with doc of the day at [facility] next Monday or Tuesday.

## 2011-08-29 ENCOUNTER — Encounter: Payer: Self-pay | Admitting: Internal Medicine

## 2011-08-29 ENCOUNTER — Ambulatory Visit (INDEPENDENT_AMBULATORY_CARE_PROVIDER_SITE_OTHER): Payer: Medicare Other | Admitting: Internal Medicine

## 2011-08-29 VITALS — BP 124/68 | HR 100 | Temp 99.3°F | Resp 16 | Wt 123.0 lb

## 2011-08-29 DIAGNOSIS — R198 Other specified symptoms and signs involving the digestive system and abdomen: Secondary | ICD-10-CM

## 2011-08-29 DIAGNOSIS — K6289 Other specified diseases of anus and rectum: Secondary | ICD-10-CM

## 2011-08-31 ENCOUNTER — Telehealth: Payer: Self-pay

## 2011-08-31 ENCOUNTER — Telehealth (INDEPENDENT_AMBULATORY_CARE_PROVIDER_SITE_OTHER): Payer: Self-pay | Admitting: General Surgery

## 2011-08-31 NOTE — Telephone Encounter (Signed)
Pt's daughter called regarding referral to [facility]. Per daughter, MEN stated that he would contact [facility] for a sooner appt than 07/15 for pt? Please advise.

## 2011-08-31 NOTE — Telephone Encounter (Signed)
After some effort was able to talk to Dr. Michaell Cowing - he was going to have his office set up an appointment Monday or tuesday

## 2011-08-31 NOTE — Telephone Encounter (Signed)
Daughter advised via VM and will expect a call from [facility] regarding appt.

## 2011-08-31 NOTE — Telephone Encounter (Signed)
Dr. Debby Bud and Dr. Michaell Cowing wanted to move the patients appt time up.  I called the patients daughter ad let her know that I had moved the appt from 7/16 to 6/26 at 11:30.  She was happy with this time.

## 2011-09-01 NOTE — Progress Notes (Signed)
  Subjective:    Patient ID: Desiree [name], female    DOB: January 25, 1921, 78 y.o.   MRN: [id number]  HPI Desiree [name]'s daughter called several days ago to report that her mother was having a protrusion from the rectum with some fluid leakage but no bleeding, no pain. Reviewed CAN note and my response with instructions to [facility]. Per patient's daughter they were never contacted and thus kept this appointment. On arrival when they inquired as to surgical consult they were told Desiree [name] was to see Dr. Michaell Cowing July 15th.  Desiree [name] is not having pain but does have some discomfort and has to wear a pad due to rectal drainage.  Past Medical History  Diagnosis Date  . Dementia   . Diabetes mellitus     type  II- diet controlled  . Hyperlipidemia   . Hypertension   . Osteoarthritis     hands  . Allergic rhinitis   . Anemia   . GERD (gastroesophageal reflux disease)   . Hypothyroidism   . Urinary incontinence     stress   Past Surgical History  Procedure Date  . Carpal tunnel release     bilateral  . Cataract extraction 2006    left  . Abdominal hysterectomy     nonmalignant reasons  . Vein ligation and stripping     left leg  . Right eye surgery 7-00  . Lumbar fusion 3-01   Family History  Problem Relation Age of Onset  . Other Mother     CVA/ grief  . Heart attack Father   . Heart attack Brother 47  . Cancer Neg Hx     breast or colon   History   Social History  . Marital Status: Widowed    Spouse Name: N/A    Number of Children: N/A  . Years of Education: N/A   Occupational History  . homemaker    Social History Main Topics  . Smoking status: Former Games developer  . Smokeless tobacco: Never Used  . Alcohol Use: No  . Drug Use: No  . Sexually Active: No   Other Topics Concern  . Not on file   Social History Narrative   married 30 years -divorced; remarried 59 -widowed '83 . 1 daughter 1 son. 1 grandchild, 2 great grandchildren. worked in Environmental health practitioner, retired. lives alone - IADLs except driving. Terminal care issues: patient clearly states, daughter present, that she would not want heroic or futile care, e.g. CPR, mechanical vent. support, etc.       Review of Systems System review is negative for any constitutional, cardiac, pulmonary, GI or neuro symptoms or complaints other than as described in the HPI.     Objective:   Physical Exam Filed Vitals:   08/29/11 1632  BP: 124/68  Pulse: 100  Temp: 99.3 F (37.4 C)  Resp: 16   Gen'l- pleasant older woman in no acute distress Cor- RRR Pulm - normal respirations Rectal exam - patient with red mass at the rectum without protrusion on my exam. No visible bleeding.       Assessment & Plan:  Rectal protrusion - appearance of either prolapse of rectum vs extruded internal hemorrhoid.  Plan Will attempt to get a sooner appointment with Dr. Michaell Cowing.

## 2011-09-05 ENCOUNTER — Ambulatory Visit (INDEPENDENT_AMBULATORY_CARE_PROVIDER_SITE_OTHER): Payer: Medicare Other | Admitting: Surgery

## 2011-09-05 ENCOUNTER — Encounter (INDEPENDENT_AMBULATORY_CARE_PROVIDER_SITE_OTHER): Payer: Self-pay | Admitting: Surgery

## 2011-09-05 VITALS — BP 122/68 | HR 102 | Temp 97.6°F | Resp 18 | Ht 63.0 in | Wt 127.4 lb

## 2011-09-05 DIAGNOSIS — K623 Rectal prolapse: Secondary | ICD-10-CM

## 2011-09-05 DIAGNOSIS — Z5320 Procedure and treatment not carried out because of patient's decision for unspecified reasons: Secondary | ICD-10-CM

## 2011-09-05 NOTE — Progress Notes (Addendum)
Subjective:     Patient ID: Desiree [name], female   DOB: 06/11/1920, 78 y.o.   MRN: [id number]  HPI  Desiree [name]  1920-06-07 [id number]  Patient Care Team: Jacques Navy, MD as PCP - General  This patient is a 78 y.o.female who presents today for surgical evaluation at the request of Dr. Debby Bud.   Reason for visit: Rectal prolapse.  As noted we will induce had issues of tissue protruding from the rectum.  Severe enough to have chronic neck mucus drainage.  Documented to have rectal prolapse.  This is reduced.  Never had a colonoscopy.  Does struggle with some constipation.  Was seen by primary care physician.  He was concerned and recommended surgical evaluation.  Patient Active Problem List  Diagnosis  . HYPOTHYROIDISM  . DIABETES MELLITUS, TYPE II  . HYPERLIPIDEMIA  . ANEMIA  . DEMENTIA  . CATARACTS  . HYPERTENSION  . Allergic Rhinitis, Cause Unspecified  . GERD  . OSTEOARTHRITIS  . URINARY INCONTINENCE  . Routine health maintenance  . Rectal procidentia / prolapse  . Colonoscopy refused    Past Medical History  Diagnosis Date  . Dementia   . Diabetes mellitus     type  II- diet controlled  . Hyperlipidemia   . Hypertension   . Osteoarthritis     hands  . Allergic rhinitis   . Anemia   . GERD (gastroesophageal reflux disease)   . Hypothyroidism   . Urinary incontinence     stress  . Blood transfusion   . Osteoporosis   . Rectal prolapse     Past Surgical History  Procedure Date  . Carpal tunnel release     bilateral  . Cataract extraction 2006    left  . Abdominal hysterectomy     nonmalignant reasons  . Vein ligation and stripping     left leg  . Right eye surgery 7-00  . Lumbar fusion 3-01    History   Social History  . Marital Status: Widowed    Spouse Name: N/A    Number of Children: N/A  . Years of Education: N/A   Occupational History  . homemaker    Social History Main Topics  . Smoking status: Former Games developer  .  Smokeless tobacco: Never Used  . Alcohol Use: No  . Drug Use: No  . Sexually Active: No   Other Topics Concern  . Not on file   Social History Narrative   married 30 years -divorced; remarried 93 -widowed '83 . 1 daughter 1 son. 1 grandchild, 2 great grandchildren. 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 Terminal care issues: patient clearly states, daughter present, that she would not want heroic or futile care, e.g. CPR, mechanical vent. support, etc.    Family History  Problem Relation Age of Onset  . Other Mother     CVA/ grief  . Heart disease Mother   . Heart attack Father   . Heart disease Father   . Heart attack Brother 47  . Cancer Neg Hx     breast or colon    Current Outpatient Prescriptions  Medication Sig Dispense Refill  . amLODipine (NORVASC) 10 MG tablet Take 1 tablet (10 mg total) by mouth daily.  90 tablet  3  . Ascorbic Acid (VITAMIN C) 1000 MG tablet Take 1,000 mg by mouth daily.        Marland Kitchen aspirin 81 MG tablet Take 81 mg by mouth  daily.        . brimonidine (ALPHAGAN) 0.15 % ophthalmic solution Place 2 drops into both eyes daily.  15 mL  3  . calcium carbonate (TUMS) 500 MG chewable tablet Chew 2 tablets by mouth daily.        . fexofenadine (ALLEGRA) 180 MG tablet Take 180 mg by mouth every morning.      . hydrochlorothiazide (HYDRODIURIL) 50 MG tablet Take 1 tablet (50 mg total) by mouth daily.  90 tablet  3  . levothyroxine (SYNTHROID, LEVOTHROID) 100 MCG tablet Take 1 tablet (100 mcg total) by mouth daily.  90 tablet  3  . loratadine-pseudoephedrine (CLARITIN-D 24-HOUR) 10-240 MG per 24 hr tablet Take 1 tablet by mouth daily.      Marland Kitchen losartan (COZAAR) 100 MG tablet Take 1 tablet (100 mg total) by mouth daily.  90 tablet  3  . multivitamin (THERAGRAN) per tablet Take 1 tablet by mouth daily.        . naproxen sodium (ANAPROX) 220 MG tablet Take 220 mg by mouth daily.        Marland Kitchen omeprazole (PRILOSEC) 40 MG capsule  Take 1 capsule (40 mg total) by mouth daily.  90 capsule  3  . potassium chloride (KLOR-CON 10) 10 MEQ tablet Take 1 tablet (10 mEq total) by mouth 3 (three) times daily.  90 tablet  3  . vitamin E (VITAMIN E) 1000 UNIT capsule Take 1,000 Units by mouth daily.        . Wheat Dextrin (BENEFIBER) TABS Take by mouth daily.           Allergies  Allergen Reactions  . Other     Bioxon.  Marland Kitchen Rofecoxib     REACTION: GI  . Sulfur     BP 122/68  Pulse 102  Temp 97.6 F (36.4 C) (Temporal)  Resp 18  Ht 5\' 3"  (1.6 m)  Wt 127 lb 6.4 oz (57.788 kg)  BMI 22.57 kg/m2  No results found.   Review of Systems  Constitutional: Negative for fever, chills, diaphoresis, appetite change and fatigue.  HENT: Positive for hearing loss. Negative for ear pain, sore throat, trouble swallowing, neck pain and ear discharge.   Eyes: Negative for photophobia, discharge and visual disturbance.  Respiratory: Negative for cough, choking, chest tightness and shortness of breath.   Cardiovascular: Negative for chest pain and palpitations.  Gastrointestinal: Positive for constipation. Negative for nausea, vomiting, abdominal pain, diarrhea, anal bleeding and rectal pain.  Genitourinary: Positive for difficulty urinating. Negative for dysuria and frequency.  Musculoskeletal: Positive for myalgias and arthralgias. Negative for gait problem.  Skin: Negative for color change, pallor and rash.  Neurological: Positive for weakness. Negative for dizziness, speech difficulty and numbness.  Hematological: Negative for adenopathy. Bruises/bleeds easily.  Psychiatric/Behavioral: Negative for confusion and agitation. The patient is not nervous/anxious.        Objective:   Physical Exam  Constitutional: She is oriented to person, place, and time. She appears well-developed and well-nourished. No distress.  HENT:  Head: Normocephalic.  Mouth/Throat: Oropharynx is clear and moist. No oropharyngeal exudate.  Eyes:  Conjunctivae and EOM are normal. Pupils are equal, round, and reactive to light. No scleral icterus.  Neck: Normal range of motion. Neck supple. No tracheal deviation present.  Cardiovascular: Normal rate, regular rhythm and intact distal pulses.   Pulmonary/Chest: Effort normal and breath sounds normal. No respiratory distress. She exhibits no tenderness.  Abdominal: Soft. She exhibits no distension and no mass. There  is no tenderness. Hernia confirmed negative in the right inguinal area and confirmed negative in the left inguinal area.  Genitourinary: No vaginal discharge found.       Exam done with assistance of female Medical Assistant in the room.  Perianal skin clean with good hygiene.  No pruritis.  No external skin tags / hemorrhoids of significance.  No pilonidal disease.  No fissure.  No abscess/fistula.    Tolerates digital and anoscopic rectal exam.  Decreased but intact sphincter tone.  No defects No rectal masses.  Hemorrhoidal piles moderately enlarged R ant>post  Complete circumferential rectal prolapse with Valsalva ~5cm  Musculoskeletal: Normal range of motion. She exhibits no tenderness.  Lymphadenopathy:    She has no cervical adenopathy.       Right: No inguinal adenopathy present.       Left: No inguinal adenopathy present.  Neurological: She is alert and oriented to person, place, and time. No cranial nerve deficit. She exhibits normal muscle tone. Coordination normal.  Skin: Skin is warm and dry. No rash noted. She is not diaphoretic. No erythema.  Psychiatric: She has a normal mood and affect. Her speech is normal and behavior is normal. Judgment and thought content normal. Her mood appears not anxious. Her affect is not labile and not inappropriate. She is not slowed and not withdrawn. Cognition and memory are normal. Cognition and memory are not impaired. She does not express impulsivity. She does not exhibit a depressed mood.       Assessment:     Rectal  prolapse with incontinence     Plan:     I think she would benefit for surgery for this.  She's had pretty good exercise tolerance until the prolapse made that treadmill activity per habitus.  My instinct Is that she would tolerate surgery fine.    However, I would like to get Dr. Debby Bud' input to make sure he does not have any concerns.  Consider cardiac clearance if he is concerned.    Ideally, I would get a colonoscopy preoperatively.  She is refuses.  We'll see if we can at least get an enema to make sure there is no endoluminal masses/polyps/tumors of significance.  He would also be helpful to get a sense To see if she has significantly redundant colon that will need to be excised.  Did discuss the procedure with the patient and her daughter in detail:  The anatomy & physiology of the digestive tract was discussed.  The pathophysiology of rectal prolapse was discussed.  Natural history risks without surgery was discussed.   I feel the risks of no intervention will lead to serious problems that outweigh the operative risks; therefore, I recommended surgery to treat the pathology.  Possible need for sigmoid colectomy to remove redundant colon was discussed.  Pexy by suture and probable mesh reinforcement was discussed as well.  Laparoscopic & open techniques were discussed.   Risks such as bleeding, infection, abscess, leak, reoperation, possible ostomy, hernia, heart attack, death, and other risks were discussed.   I noted a good likelihood this will help address the problem.  Goals of post-operative recovery were discussed as well.  We will work to minimize complications.  An educational handout on the technique was given as well.    Questions were answered.  The patient and daughter expressed understanding.  The daughter is concerned about general anesthesia.  She's concerned about complications.  The patient had a rough recovery after her back surgery.  I did  note that her dance age makes her  more fragile and last able to tolerate complications.  However, her good exercise tolerance gives me hope.  I also noted there's not any good alternative to this.  I am skeptical that Altimeter procedure would be sufficient.  I do think and improved bowel regimen would help her cause as well.  The patient and daughter seemed more convinced by the end of our discussion and wish to proceed with surgery.

## 2011-09-07 ENCOUNTER — Ambulatory Visit: Payer: Medicare Other | Admitting: Internal Medicine

## 2011-09-10 ENCOUNTER — Telehealth (INDEPENDENT_AMBULATORY_CARE_PROVIDER_SITE_OTHER): Payer: Self-pay

## 2011-09-10 NOTE — Telephone Encounter (Signed)
Patients daughter called regarding concern or her mother. She states patient is having severe diarrhea now with blood and is getting dehydrated. She is having problems urinating; having to go often because she is only dribbling when she goes. They are concerned about doing enema tomorrow with her worsening symptoms. I told her we would let Dr. Michaell Cowing know of this and call her back.

## 2011-09-10 NOTE — Telephone Encounter (Signed)
Called pt's daughter back to let her know that Dr Michaell Cowing said we could delay the BE for a few weeks out to get the pt feeling better. Dr Michaell Cowing just wants the BE done before her surgery b/c the pt declined to get a colonoscopy. I advised the pt needed to drink plenty of fluids today to help with dehydration after having diarrhea and if the pt gets worse to please call back. I will call the daughter back wiht the details of the new BE date. The daughter understands.  I called daughter to notify her that I have her scheduled for the BE at [facility] on 7/25 arrive at 9:15/9:30.

## 2011-09-11 ENCOUNTER — Other Ambulatory Visit (HOSPITAL_COMMUNITY): Payer: Medicare Other

## 2011-09-24 ENCOUNTER — Ambulatory Visit (INDEPENDENT_AMBULATORY_CARE_PROVIDER_SITE_OTHER): Payer: Medicare Other | Admitting: Surgery

## 2011-10-04 ENCOUNTER — Other Ambulatory Visit (INDEPENDENT_AMBULATORY_CARE_PROVIDER_SITE_OTHER): Payer: Self-pay | Admitting: Surgery

## 2011-10-04 ENCOUNTER — Ambulatory Visit (HOSPITAL_COMMUNITY)
Admission: RE | Admit: 2011-10-04 | Discharge: 2011-10-04 | Disposition: A | Discharge status: Home / Self Care | Payer: Medicare Other | Source: Ambulatory Visit | Attending: Surgery | Admitting: Surgery

## 2011-10-04 DIAGNOSIS — Z5320 Procedure and treatment not carried out because of patient's decision for unspecified reasons: Secondary | ICD-10-CM

## 2011-10-04 DIAGNOSIS — K5730 Diverticulosis of large intestine without perforation or abscess without bleeding: Secondary | ICD-10-CM | POA: Insufficient documentation

## 2011-10-04 DIAGNOSIS — K623 Rectal prolapse: Secondary | ICD-10-CM

## 2011-10-08 NOTE — Progress Notes (Signed)
Patient has preop appointment on 10/10/11 at 1100am.  Pt is scheduled to have surgery on 10/18/11.  Need orders placed in EPIC.  Thanks.

## 2011-10-09 ENCOUNTER — Encounter (HOSPITAL_COMMUNITY): Payer: Self-pay | Admitting: Pharmacy Technician

## 2011-10-10 ENCOUNTER — Other Ambulatory Visit (INDEPENDENT_AMBULATORY_CARE_PROVIDER_SITE_OTHER): Payer: Self-pay | Admitting: Surgery

## 2011-10-10 ENCOUNTER — Encounter (HOSPITAL_COMMUNITY): Payer: Self-pay

## 2011-10-10 ENCOUNTER — Encounter (HOSPITAL_COMMUNITY)
Admission: RE | Admit: 2011-10-10 | Discharge: 2011-10-10 | Disposition: A | Discharge status: Home / Self Care | Payer: Medicare Other | Source: Ambulatory Visit | Attending: Surgery | Admitting: Surgery

## 2011-10-10 ENCOUNTER — Ambulatory Visit (HOSPITAL_COMMUNITY)
Admission: RE | Admit: 2011-10-10 | Discharge: 2011-10-10 | Disposition: A | Discharge status: Home / Self Care | Payer: Medicare Other | Source: Ambulatory Visit | Attending: Surgery | Admitting: Surgery

## 2011-10-10 DIAGNOSIS — K5730 Diverticulosis of large intestine without perforation or abscess without bleeding: Secondary | ICD-10-CM | POA: Insufficient documentation

## 2011-10-10 DIAGNOSIS — Z01812 Encounter for preprocedural laboratory examination: Secondary | ICD-10-CM | POA: Insufficient documentation

## 2011-10-10 DIAGNOSIS — Z01810 Encounter for preprocedural cardiovascular examination: Secondary | ICD-10-CM | POA: Insufficient documentation

## 2011-10-10 DIAGNOSIS — Z01818 Encounter for other preprocedural examination: Secondary | ICD-10-CM | POA: Insufficient documentation

## 2011-10-10 HISTORY — DX: Unspecified hearing loss, unspecified ear: H91.90

## 2011-10-10 HISTORY — DX: Unspecified glaucoma: H40.9

## 2011-10-10 HISTORY — DX: Unqualified visual loss, right eye, normal vision left eye: H54.61

## 2011-10-10 HISTORY — DX: Unspecified macular degeneration: H35.30

## 2011-10-10 LAB — CBC
HCT: 34.6 % — ABNORMAL LOW (ref 36.0–46.0)
Hemoglobin: 11.2 g/dL — ABNORMAL LOW (ref 12.0–15.0)
MCHC: 32.4 g/dL (ref 30.0–36.0)
MCV: 86.3 fL (ref 78.0–100.0)

## 2011-10-10 LAB — BASIC METABOLIC PANEL
BUN: 21 mg/dL (ref 6–23)
Chloride: 102 mEq/L (ref 96–112)
Glucose, Bld: 106 mg/dL — ABNORMAL HIGH (ref 70–99)
Potassium: 3.8 mEq/L (ref 3.5–5.1)

## 2011-10-10 LAB — SURGICAL PCR SCREEN: Staphylococcus aureus: POSITIVE — AB

## 2011-10-10 NOTE — Pre-Procedure Instructions (Signed)
Patient and daughter state have bowel prep instructions given per office

## 2011-10-10 NOTE — Patient Instructions (Signed)
22 Desiree [name]  10/10/2011   Your procedure is scheduled on: 10/18/11   Surgery 0730-1100   Report to [facility] at      0515  AM.  Call this number if you have problems the morning of surgery: [phone]     Or PST   [id number]  [facility]   Remember:   Do not eat food:After Midnight. Tuesday NIGHT AS DIRECTED BY OFFICE  May have clear liquids: all day Wednesday befor surgery- increase fluids and drink up until midnight or bedtime  THEN NONE  Clear liquids include soda, tea, black coffee, apple or grape juice, broth.  Take these medicines the morning of surgery with A SIP OF WATER: AMLODIPINE, LEVOTHYROXINE, OMEPRAZOLE   Do not wear jewelry, make-up or nail polish.  Do not wear lotions, powders, or perfumes. You may wear deodorant.  Do not shave 48 hours prior to surgery.  Do not bring valuables to the hospital.  Contacts, dentures or bridgework may not be worn into surgery.  Leave suitcase in the car. After surgery it may be brought to your room.  For patients admitted to the hospital, checkout time is 11:00 AM the day of discharge.   Patients discharged the day of surgery will not be allowed to drive home.  Name and phone number of your driver:    daughter                                                                  Special Instructions: CHG Shower Use Special Wash: 1/2 bottle night before surgery and 1/2 bottle morning of surgery. REGULAR SOAP FACE AND PRIVATES              LADIES- NO SHAVING 48 HOURS BEFORE USING BETASEPT SOAP.                Please read over the following fact sheets that you were given: MRSA Information

## 2011-10-10 NOTE — Progress Notes (Signed)
DR Michaell Cowing- please add pre op orders- I saw her today for PST visit- labs per anesthesia were completed. Thanks

## 2011-10-15 NOTE — Pre-Procedure Instructions (Signed)
Faxed clarification of antibiotic order to Dr Michaell Cowing on Aug 2. 2013  At 1433

## 2011-10-17 NOTE — Anesthesia Preprocedure Evaluation (Addendum)
Anesthesia Evaluation  Patient identified by MRN, date of birth, ID band Patient awake    Reviewed: Allergy & Precautions, H&P , NPO status , Patient's Chart, lab work & pertinent test results  Airway Mallampati: II TM Distance: >3 FB Neck ROM: Full    Dental  (+) Teeth Intact and Dental Advisory Given   Pulmonary neg pulmonary ROS,  breath sounds clear to auscultation  Pulmonary exam normal       Cardiovascular hypertension, Pt. on medications Rhythm:Regular Rate:Normal     Neuro/Psych PSYCHIATRIC DISORDERS negative neurological ROS     GI/Hepatic Neg liver ROS, GERD-  Medicated,  Endo/Other  Type 2Hypothyroidism   Renal/GU negative Renal ROS  negative genitourinary   Musculoskeletal  (+) Arthritis -, Osteoarthritis,    Abdominal   Peds  Hematology negative hematology ROS (+)   Anesthesia Other Findings   Reproductive/Obstetrics negative OB ROS                          Anesthesia Physical Anesthesia Plan  ASA: II  Anesthesia Plan: General   Post-op Pain Management:    Induction: Intravenous  Airway Management Planned: Oral ETT  Additional Equipment:   Intra-op Plan:   Post-operative Plan: Extubation in OR  Informed Consent: I have reviewed the patients History and Physical, chart, labs and discussed the procedure including the risks, benefits and alternatives for the proposed anesthesia with the patient or authorized representative who has indicated his/her understanding and acceptance.   Dental advisory given  Plan Discussed with: CRNA  Anesthesia Plan Comments:        Anesthesia Quick Evaluation

## 2011-10-18 ENCOUNTER — Encounter (HOSPITAL_COMMUNITY): Payer: Self-pay | Admitting: *Deleted

## 2011-10-18 ENCOUNTER — Encounter (HOSPITAL_COMMUNITY)
Admission: RE | Disposition: A | Discharge status: Home Health | Payer: Self-pay | Source: Ambulatory Visit | Attending: Surgery

## 2011-10-18 ENCOUNTER — Encounter (HOSPITAL_COMMUNITY): Payer: Self-pay | Admitting: Anesthesiology

## 2011-10-18 ENCOUNTER — Ambulatory Visit (HOSPITAL_COMMUNITY): Payer: Medicare Other | Admitting: Anesthesiology

## 2011-10-18 ENCOUNTER — Inpatient Hospital Stay (HOSPITAL_COMMUNITY)
Admission: RE | Admit: 2011-10-18 | Discharge: 2011-10-22 | DRG: 331 | Disposition: A | Discharge status: Home Health | Payer: Medicare Other | Source: Ambulatory Visit | Attending: Surgery | Admitting: Surgery

## 2011-10-18 DIAGNOSIS — R32 Unspecified urinary incontinence: Secondary | ICD-10-CM | POA: Diagnosis present

## 2011-10-18 DIAGNOSIS — E119 Type 2 diabetes mellitus without complications: Secondary | ICD-10-CM | POA: Diagnosis present

## 2011-10-18 DIAGNOSIS — D649 Anemia, unspecified: Secondary | ICD-10-CM | POA: Diagnosis present

## 2011-10-18 DIAGNOSIS — D72829 Elevated white blood cell count, unspecified: Secondary | ICD-10-CM | POA: Diagnosis present

## 2011-10-18 DIAGNOSIS — K5730 Diverticulosis of large intestine without perforation or abscess without bleeding: Secondary | ICD-10-CM

## 2011-10-18 DIAGNOSIS — K623 Rectal prolapse: Principal | ICD-10-CM | POA: Diagnosis present

## 2011-10-18 DIAGNOSIS — K219 Gastro-esophageal reflux disease without esophagitis: Secondary | ICD-10-CM | POA: Diagnosis present

## 2011-10-18 DIAGNOSIS — F0390 Unspecified dementia without behavioral disturbance: Secondary | ICD-10-CM | POA: Diagnosis present

## 2011-10-18 DIAGNOSIS — I10 Essential (primary) hypertension: Secondary | ICD-10-CM | POA: Diagnosis present

## 2011-10-18 DIAGNOSIS — Z5320 Procedure and treatment not carried out because of patient's decision for unspecified reasons: Secondary | ICD-10-CM

## 2011-10-18 LAB — CBC
HCT: 29.8 % — ABNORMAL LOW (ref 36.0–46.0)
MCHC: 33.2 g/dL (ref 30.0–36.0)
Platelets: 245 10*3/uL (ref 150–400)
RDW: 13.8 % (ref 11.5–15.5)
WBC: 20.6 10*3/uL — ABNORMAL HIGH (ref 4.0–10.5)

## 2011-10-18 LAB — ABO/RH: ABO/RH(D): A POS

## 2011-10-18 LAB — GLUCOSE, CAPILLARY: Glucose-Capillary: 107 mg/dL — ABNORMAL HIGH (ref 70–99)

## 2011-10-18 LAB — CREATININE, SERUM: GFR calc non Af Amer: 78 mL/min — ABNORMAL LOW (ref >90)

## 2011-10-18 SURGERY — RESECTION, RECTUM, LOW ANTERIOR, LAPAROSCOPIC
Anesthesia: General | Site: Rectum | Wound class: Clean Contaminated

## 2011-10-18 MED ORDER — CALCIUM CARBONATE ANTACID 500 MG PO CHEW
2.0000 | CHEWABLE_TABLET | Freq: Every day | ORAL | Status: DC
  Administered 2011-10-18 – 2011-10-22 (×5): 400 mg via ORAL
  Filled 2011-10-18 (×5): qty 2

## 2011-10-18 MED ORDER — EPHEDRINE SULFATE 50 MG/ML IJ SOLN
INTRAMUSCULAR | Status: DC | PRN
  Administered 2011-10-18: 5 mg via INTRAVENOUS
  Administered 2011-10-18: 10 mg via INTRAVENOUS

## 2011-10-18 MED ORDER — MULTIVITAMINS PO TABS: 1.0000 | ORAL_TABLET | Freq: Every day | ORAL | Status: DC

## 2011-10-18 MED ORDER — NAPROXEN 250 MG PO TABS
250.0000 mg | ORAL_TABLET | Freq: Two times a day (BID) | ORAL | Status: DC
  Administered 2011-10-18 – 2011-10-22 (×8): 250 mg via ORAL
  Filled 2011-10-18 (×11): qty 1

## 2011-10-18 MED ORDER — METRONIDAZOLE IN NACL 5-0.79 MG/ML-% IV SOLN
500.0000 mg | Freq: Four times a day (QID) | INTRAVENOUS | Status: AC
  Administered 2011-10-18 – 2011-10-19 (×4): 500 mg via INTRAVENOUS
  Filled 2011-10-18 (×6): qty 100

## 2011-10-18 MED ORDER — PROMETHAZINE HCL 25 MG/ML IJ SOLN: 12.5000 mg | Freq: Four times a day (QID) | INTRAMUSCULAR | Status: DC | PRN

## 2011-10-18 MED ORDER — AMLODIPINE BESYLATE 10 MG PO TABS
10.0000 mg | ORAL_TABLET | Freq: Every day | ORAL | Status: DC
  Administered 2011-10-19 – 2011-10-22 (×4): 10 mg via ORAL
  Filled 2011-10-18 (×4): qty 1

## 2011-10-18 MED ORDER — HYDROMORPHONE HCL PF 1 MG/ML IJ SOLN
0.5000 mg | INTRAMUSCULAR | Status: DC | PRN
  Administered 2011-10-18 – 2011-10-19 (×3): 0.5 mg via INTRAVENOUS
  Filled 2011-10-18 (×3): qty 1

## 2011-10-18 MED ORDER — BUPIVACAINE-EPINEPHRINE 0.25% -1:200000 IJ SOLN
INTRAMUSCULAR | Status: DC | PRN
  Administered 2011-10-18: 50 mL

## 2011-10-18 MED ORDER — ONDANSETRON HCL 4 MG/2ML IJ SOLN
INTRAMUSCULAR | Status: DC | PRN
  Administered 2011-10-18: 4 mg via INTRAVENOUS

## 2011-10-18 MED ORDER — PANTOPRAZOLE SODIUM 40 MG PO TBEC
80.0000 mg | DELAYED_RELEASE_TABLET | Freq: Every day | ORAL | Status: DC
  Administered 2011-10-18 – 2011-10-21 (×4): 80 mg via ORAL
  Filled 2011-10-18 (×5): qty 2

## 2011-10-18 MED ORDER — BRIMONIDINE TARTRATE 0.15 % OP SOLN
2.0000 [drp] | Freq: Two times a day (BID) | OPHTHALMIC | Status: DC
  Administered 2011-10-18 – 2011-10-21 (×5): 2 [drp] via OPHTHALMIC
  Filled 2011-10-18: qty 5

## 2011-10-18 MED ORDER — LIDOCAINE HCL (CARDIAC) 20 MG/ML IV SOLN
INTRAVENOUS | Status: DC | PRN
  Administered 2011-10-18: 60 mg via INTRAVENOUS

## 2011-10-18 MED ORDER — LACTATED RINGERS IR SOLN
Status: DC | PRN
  Administered 2011-10-18: 1000 mL

## 2011-10-18 MED ORDER — GENTAMICIN SULFATE 40 MG/ML IJ SOLN
Freq: Once | INTRAMUSCULAR | Status: DC
  Filled 2011-10-18: qty 1000

## 2011-10-18 MED ORDER — LIP MEDEX EX OINT
1.0000 "application " | TOPICAL_OINTMENT | Freq: Two times a day (BID) | CUTANEOUS | Status: DC
  Administered 2011-10-18 – 2011-10-22 (×7): 1 via TOPICAL
  Filled 2011-10-18 (×3): qty 7

## 2011-10-18 MED ORDER — NAPROXEN SODIUM 220 MG PO TABS: 220.0000 mg | ORAL_TABLET | Freq: Two times a day (BID) | ORAL | Status: DC

## 2011-10-18 MED ORDER — METOPROLOL TARTRATE 12.5 MG HALF TABLET
12.5000 mg | ORAL_TABLET | Freq: Two times a day (BID) | ORAL | Status: DC | PRN
  Filled 2011-10-18: qty 1

## 2011-10-18 MED ORDER — PSYLLIUM 95 % PO PACK
1.0000 | PACK | Freq: Two times a day (BID) | ORAL | Status: DC
  Administered 2011-10-18 – 2011-10-21 (×7): 1 via ORAL
  Filled 2011-10-18 (×9): qty 1

## 2011-10-18 MED ORDER — HEPARIN SODIUM (PORCINE) 5000 UNIT/ML IJ SOLN
5000.0000 [IU] | Freq: Once | INTRAMUSCULAR | Status: AC
  Administered 2011-10-18: 5000 [IU] via SUBCUTANEOUS
  Filled 2011-10-18: qty 1

## 2011-10-18 MED ORDER — METRONIDAZOLE IN NACL 5-0.79 MG/ML-% IV SOLN
500.0000 mg | INTRAVENOUS | Status: AC
  Administered 2011-10-18: .5 g via INTRAVENOUS

## 2011-10-18 MED ORDER — ADULT MULTIVITAMIN W/MINERALS CH
1.0000 | ORAL_TABLET | Freq: Every day | ORAL | Status: DC
  Administered 2011-10-19 – 2011-10-22 (×4): 1 via ORAL
  Filled 2011-10-18 (×5): qty 1

## 2011-10-18 MED ORDER — PHENYLEPHRINE HCL 10 MG/ML IJ SOLN
INTRAMUSCULAR | Status: DC | PRN
  Administered 2011-10-18 (×2): 80 ug via INTRAVENOUS

## 2011-10-18 MED ORDER — PROMETHAZINE HCL 25 MG/ML IJ SOLN: 6.2500 mg | INTRAMUSCULAR | Status: DC | PRN

## 2011-10-18 MED ORDER — ACETAMINOPHEN 650 MG RE SUPP: 650.0000 mg | Freq: Four times a day (QID) | RECTAL | Status: DC | PRN

## 2011-10-18 MED ORDER — NEOSTIGMINE METHYLSULFATE 1 MG/ML IJ SOLN
INTRAMUSCULAR | Status: DC | PRN
  Administered 2011-10-18: .4 mg via INTRAVENOUS

## 2011-10-18 MED ORDER — LACTATED RINGERS IV BOLUS (SEPSIS): 1000.0000 mL | Freq: Three times a day (TID) | INTRAVENOUS | Status: AC | PRN

## 2011-10-18 MED ORDER — LACTATED RINGERS IV SOLN
INTRAVENOUS | Status: DC
  Administered 2011-10-18 – 2011-10-21 (×5): via INTRAVENOUS

## 2011-10-18 MED ORDER — ALVIMOPAN 12 MG PO CAPS
12.0000 mg | ORAL_CAPSULE | Freq: Two times a day (BID) | ORAL | Status: DC
  Administered 2011-10-19 (×2): 12 mg via ORAL
  Filled 2011-10-18 (×4): qty 1

## 2011-10-18 MED ORDER — VITAMIN C 500 MG PO TABS
500.0000 mg | ORAL_TABLET | Freq: Two times a day (BID) | ORAL | Status: DC
  Administered 2011-10-18 – 2011-10-19 (×3): 500 mg via ORAL
  Filled 2011-10-18 (×5): qty 1

## 2011-10-18 MED ORDER — ROCURONIUM BROMIDE 100 MG/10ML IV SOLN
INTRAVENOUS | Status: DC | PRN
  Administered 2011-10-18: 10 mg via INTRAVENOUS
  Administered 2011-10-18: 40 mg via INTRAVENOUS

## 2011-10-18 MED ORDER — LEVOTHYROXINE SODIUM 100 MCG PO TABS
100.0000 ug | ORAL_TABLET | Freq: Every day | ORAL | Status: DC
  Administered 2011-10-19 – 2011-10-22 (×4): 100 ug via ORAL
  Filled 2011-10-18 (×5): qty 1

## 2011-10-18 MED ORDER — LACTATED RINGERS IV SOLN: INTRAVENOUS | Status: DC

## 2011-10-18 MED ORDER — LACTATED RINGERS IV SOLN
Freq: Once | INTRAVENOUS | Status: DC
  Filled 2011-10-18: qty 1

## 2011-10-18 MED ORDER — ALUM & MAG HYDROXIDE-SIMETH 200-200-20 MG/5ML PO SUSP: 30.0000 mL | Freq: Four times a day (QID) | ORAL | Status: DC | PRN

## 2011-10-18 MED ORDER — DIPHENHYDRAMINE HCL 50 MG/ML IJ SOLN
12.5000 mg | Freq: Four times a day (QID) | INTRAMUSCULAR | Status: DC | PRN
  Administered 2011-10-20: 12.5 mg via INTRAVENOUS
  Filled 2011-10-18: qty 1

## 2011-10-18 MED ORDER — BIOTENE DRY MOUTH MT LIQD
15.0000 mL | Freq: Two times a day (BID) | OROMUCOSAL | Status: DC
  Administered 2011-10-19 – 2011-10-22 (×3): 15 mL via OROMUCOSAL

## 2011-10-18 MED ORDER — HEPARIN SODIUM (PORCINE) 5000 UNIT/ML IJ SOLN
5000.0000 [IU] | Freq: Three times a day (TID) | INTRAMUSCULAR | Status: DC
  Administered 2011-10-19 – 2011-10-22 (×10): 5000 [IU] via SUBCUTANEOUS
  Filled 2011-10-18 (×13): qty 1

## 2011-10-18 MED ORDER — BUPIVACAINE-EPINEPHRINE 0.25% -1:200000 IJ SOLN
INTRAMUSCULAR | Status: AC
  Filled 2011-10-18: qty 1

## 2011-10-18 MED ORDER — PROPOFOL 10 MG/ML IV EMUL
INTRAVENOUS | Status: DC | PRN
  Administered 2011-10-18: 120 mg via INTRAVENOUS

## 2011-10-18 MED ORDER — STERILE WATER FOR IRRIGATION IR SOLN
Status: DC | PRN
  Administered 2011-10-18: 1500 mL

## 2011-10-18 MED ORDER — SODIUM CHLORIDE 0.9 % IR SOLN
Status: DC | PRN
  Administered 2011-10-18: 2000 mL

## 2011-10-18 MED ORDER — BUPIVACAINE 0.25 % ON-Q PUMP DUAL CATH 300 ML
300.0000 mL | INJECTION | Status: DC
  Filled 2011-10-18: qty 300

## 2011-10-18 MED ORDER — FENTANYL CITRATE 0.05 MG/ML IJ SOLN
INTRAMUSCULAR | Status: DC | PRN
  Administered 2011-10-18 (×2): 25 ug via INTRAVENOUS
  Administered 2011-10-18 (×4): 50 ug via INTRAVENOUS

## 2011-10-18 MED ORDER — ASPIRIN 81 MG PO TABS: 81.0000 mg | ORAL_TABLET | Freq: Every day | ORAL | Status: DC

## 2011-10-18 MED ORDER — LOSARTAN POTASSIUM 50 MG PO TABS
100.0000 mg | ORAL_TABLET | Freq: Every day | ORAL | Status: DC
  Filled 2011-10-18 (×2): qty 2

## 2011-10-18 MED ORDER — DEXTROSE 5 % IV SOLN: 2.0000 g | Freq: Once | INTRAVENOUS | Status: DC

## 2011-10-18 MED ORDER — MAGIC MOUTHWASH
15.0000 mL | Freq: Four times a day (QID) | ORAL | Status: DC | PRN
  Filled 2011-10-18: qty 15

## 2011-10-18 MED ORDER — DEXTROSE 5 % IV SOLN
2.0000 g | INTRAVENOUS | Status: AC
  Administered 2011-10-18: 2 g via INTRAVENOUS
  Filled 2011-10-18: qty 2

## 2011-10-18 MED ORDER — LACTATED RINGERS IR SOLN
Status: DC | PRN
  Administered 2011-10-18: 3000 mL

## 2011-10-18 MED ORDER — ONDANSETRON HCL 4 MG/2ML IJ SOLN: 4.0000 mg | Freq: Four times a day (QID) | INTRAMUSCULAR | Status: DC | PRN

## 2011-10-18 MED ORDER — OXYCODONE HCL 5 MG PO TABS: 2.5000 mg | ORAL_TABLET | Freq: Four times a day (QID) | ORAL | Status: AC | PRN

## 2011-10-18 MED ORDER — ASPIRIN 81 MG PO CHEW
81.0000 mg | CHEWABLE_TABLET | Freq: Every day | ORAL | Status: DC
  Administered 2011-10-18 – 2011-10-21 (×4): 81 mg via ORAL
  Filled 2011-10-18 (×5): qty 1

## 2011-10-18 MED ORDER — SACCHAROMYCES BOULARDII 250 MG PO CAPS
250.0000 mg | ORAL_CAPSULE | Freq: Two times a day (BID) | ORAL | Status: DC
  Administered 2011-10-18 – 2011-10-22 (×8): 250 mg via ORAL
  Filled 2011-10-18 (×9): qty 1

## 2011-10-18 MED ORDER — LORATADINE 10 MG PO TABS
10.0000 mg | ORAL_TABLET | Freq: Every day | ORAL | Status: DC
  Administered 2011-10-18 – 2011-10-21 (×4): 10 mg via ORAL
  Filled 2011-10-18 (×6): qty 1

## 2011-10-18 MED ORDER — LACTATED RINGERS IV SOLN
INTRAVENOUS | Status: DC | PRN
  Administered 2011-10-18: 07:00:00 via INTRAVENOUS

## 2011-10-18 MED ORDER — ONDANSETRON HCL 4 MG PO TABS: 4.0000 mg | ORAL_TABLET | Freq: Four times a day (QID) | ORAL | Status: DC | PRN

## 2011-10-18 MED ORDER — ACETAMINOPHEN 325 MG PO TABS: 325.0000 mg | ORAL_TABLET | Freq: Four times a day (QID) | ORAL | Status: DC | PRN

## 2011-10-18 MED ORDER — METRONIDAZOLE IN NACL 5-0.79 MG/ML-% IV SOLN
INTRAVENOUS | Status: AC
  Filled 2011-10-18: qty 100

## 2011-10-18 MED ORDER — FENTANYL CITRATE 0.05 MG/ML IJ SOLN: 25.0000 ug | INTRAMUSCULAR | Status: DC | PRN

## 2011-10-18 MED ORDER — VITAMIN E 45 MG (100 UNIT) PO CAPS
1000.0000 [IU] | ORAL_CAPSULE | Freq: Every day | ORAL | Status: DC
  Administered 2011-10-18 – 2011-10-22 (×5): 1000 [IU] via ORAL
  Filled 2011-10-18 (×5): qty 2

## 2011-10-18 MED ORDER — ALVIMOPAN 12 MG PO CAPS
12.0000 mg | ORAL_CAPSULE | Freq: Once | ORAL | Status: AC
  Administered 2011-10-18: 12 mg via ORAL
  Filled 2011-10-18: qty 1

## 2011-10-18 SURGICAL SUPPLY — 86 items
APPLIER CLIP ROT 10 11.4 M/L (STAPLE)
APR CLP MED LRG 11.4X10 (STAPLE)
BAG DECANTER FOR FLEXI CONT (MISCELLANEOUS) ×1 IMPLANT
BLADE SURG 10 STRL SS (BLADE) ×3 IMPLANT
BLADE SURG ROTATE 9660 (MISCELLANEOUS) IMPLANT
CANISTER SUCTION 2500CC (MISCELLANEOUS) ×4 IMPLANT
CELLS DAT CNTRL 66122 CELL SVR (MISCELLANEOUS) ×2 IMPLANT
CHLORAPREP W/TINT 26ML (MISCELLANEOUS) ×3 IMPLANT
CLIP APPLIE ROT 10 11.4 M/L (STAPLE) IMPLANT
CLOTH BEACON ORANGE TIMEOUT ST (SAFETY) ×3 IMPLANT
COVER SURGICAL LIGHT HANDLE (MISCELLANEOUS) ×1 IMPLANT
DECANTER SPIKE VIAL GLASS SM (MISCELLANEOUS) ×4 IMPLANT
DISSECTOR BLUNT TIP ENDO 5MM (MISCELLANEOUS) IMPLANT
DRAIN CHANNEL RND F F (WOUND CARE) ×2 IMPLANT
DRAPE LG THREE QUARTER DISP (DRAPES) ×4 IMPLANT
DRAPE PROXIMA HALF (DRAPES) IMPLANT
DRAPE UTILITY 15X26 W/TAPE STR (DRAPE) ×5 IMPLANT
DRAPE WARM FLUID 44X44 (DRAPE) ×4 IMPLANT
DRSG TEGADERM 4X4.75 (GAUZE/BANDAGES/DRESSINGS) ×2 IMPLANT
DRSG TEGADERM 8X12 (GAUZE/BANDAGES/DRESSINGS) ×1 IMPLANT
ELECT CAUTERY BLADE 6.4 (BLADE) ×3 IMPLANT
ELECT REM PT RETURN 9FT ADLT (ELECTROSURGICAL) ×3
ELECTRODE REM PT RTRN 9FT ADLT (ELECTROSURGICAL) ×2 IMPLANT
EVACUATOR SILICONE 100CC (DRAIN) ×1 IMPLANT
GEL ULTRASOUND 20GR AQUASONIC (MISCELLANEOUS) IMPLANT
GLOVE BIO SURGEON STRL SZ 6 (GLOVE) ×6 IMPLANT
GLOVE BIOGEL PI IND STRL 6.5 (GLOVE) ×2 IMPLANT
GLOVE BIOGEL PI INDICATOR 6.5 (GLOVE) ×2
GOWN PREVENTION PLUS XLARGE (GOWN DISPOSABLE) ×6 IMPLANT
GOWN PREVENTION PLUS XXLARGE (GOWN DISPOSABLE) ×5 IMPLANT
GOWN STRL NON-REIN LRG LVL3 (GOWN DISPOSABLE) ×6 IMPLANT
KIT BASIN OR (CUSTOM PROCEDURE TRAY) ×3 IMPLANT
KIT ROOM TURNOVER OR (KITS) ×3 IMPLANT
LEGGING LITHOTOMY PAIR STRL (DRAPES) ×1 IMPLANT
LIGASURE 5MM LAPAROSCOPIC (INSTRUMENTS) IMPLANT
LIGASURE IMPACT 36 18CM CVD LR (INSTRUMENTS) ×1 IMPLANT
NS IRRIG 1000ML POUR BTL (IV SOLUTION) ×6 IMPLANT
PAD ARMBOARD 7.5X6 YLW CONV (MISCELLANEOUS) ×4 IMPLANT
PENCIL BUTTON HOLSTER BLD 10FT (ELECTRODE) ×3 IMPLANT
RETRACTOR WND ALEXIS 18 MED (MISCELLANEOUS) IMPLANT
RTRCTR WOUND ALEXIS 18CM MED (MISCELLANEOUS) ×3
SCALPEL HARMONIC ACE (MISCELLANEOUS) ×1 IMPLANT
SCISSORS LAP 5X35 DISP (ENDOMECHANICALS) ×2 IMPLANT
SEALER TISSUE G2 CVD JAW 35 (ENDOMECHANICALS) IMPLANT
SEALER TISSUE G2 CVD JAW 45CM (ENDOMECHANICALS) ×1
SET IRRIG TUBING LAPAROSCOPIC (IRRIGATION / IRRIGATOR) ×1 IMPLANT
SLEEVE ENDOPATH XCEL 5M (ENDOMECHANICALS) ×4 IMPLANT
SPECIMEN JAR LARGE (MISCELLANEOUS) ×3 IMPLANT
SPONGE GAUZE 4X4 12PLY (GAUZE/BANDAGES/DRESSINGS) ×3 IMPLANT
SPONGE LAP 18X18 X RAY DECT (DISPOSABLE) ×2 IMPLANT
STAPLER CIRC ILS CVD 33MM 37CM (STAPLE) ×2 IMPLANT
STAPLER CUT CVD 40MM BLUE (STAPLE) ×2 IMPLANT
STAPLER VISISTAT 35W (STAPLE) ×3 IMPLANT
SUCTION POOLE TIP (SUCTIONS) ×3 IMPLANT
SURGILUBE 2OZ TUBE FLIPTOP (MISCELLANEOUS) IMPLANT
SUT PDS AB 1 CT  36 (SUTURE)
SUT PDS AB 1 CT 36 (SUTURE) IMPLANT
SUT PDS AB 2-0 CT2 27 (SUTURE) ×4 IMPLANT
SUT PROLENE 0 SH 30 (SUTURE) ×1 IMPLANT
SUT PROLENE 2 0 CT2 30 (SUTURE) ×4 IMPLANT
SUT PROLENE 2 0 KS (SUTURE) IMPLANT
SUT SILK 2 0 (SUTURE) ×3
SUT SILK 2 0 SH CR/8 (SUTURE) ×3 IMPLANT
SUT SILK 2-0 18XBRD TIE 12 (SUTURE) ×2 IMPLANT
SUT SILK 3 0 (SUTURE) ×3
SUT SILK 3 0 SH CR/8 (SUTURE) ×3 IMPLANT
SUT SILK 3-0 18XBRD TIE 12 (SUTURE) ×2 IMPLANT
SYS LAPSCP GELPORT 120MM (MISCELLANEOUS)
SYSTEM LAPSCP GELPORT 120MM (MISCELLANEOUS) IMPLANT
TACKER 5MM HERNIA 3.5CML NAB (ENDOMECHANICALS) ×2 IMPLANT
TISSUE MATRIX STRATTICE 10X10 (Tissue) ×1 IMPLANT
TOWEL OR 17X24 6PK STRL BLUE (TOWEL DISPOSABLE) ×3 IMPLANT
TOWEL OR 17X26 10 PK STRL BLUE (TOWEL DISPOSABLE) ×3 IMPLANT
TRAY FOLEY CATH 14FRSI W/METER (CATHETERS) ×1 IMPLANT
TRAY LAP CHOLE (CUSTOM PROCEDURE TRAY) ×1 IMPLANT
TRAY LAPAROSCOPIC (CUSTOM PROCEDURE TRAY) ×2 IMPLANT
TRAY PROCTOSCOPIC FIBER OPTIC (SET/KITS/TRAYS/PACK) IMPLANT
TROCAR XCEL 12X100 BLDLESS (ENDOMECHANICALS) IMPLANT
TROCAR XCEL BLUNT TIP 100MML (ENDOMECHANICALS) ×2 IMPLANT
TROCAR XCEL NON-BLD 11X100MML (ENDOMECHANICALS) IMPLANT
TROCAR XCEL NON-BLD 5MMX100MML (ENDOMECHANICALS) ×3 IMPLANT
TROCAR Z-THREAD FIOS 5X100MM (TROCAR) ×2 IMPLANT
TUBE CONNECTING 12X1/4 (SUCTIONS) ×3 IMPLANT
TUBING FILTER THERMOFLATOR (ELECTROSURGICAL) ×3 IMPLANT
WATER STERILE IRR 1000ML POUR (IV SOLUTION) ×3 IMPLANT
YANKAUER SUCT BULB TIP NO VENT (SUCTIONS) ×5 IMPLANT

## 2011-10-18 NOTE — Transfer of Care (Signed)
Immediate Anesthesia Transfer of Care Note  Patient: Desiree [name]  Procedure(s) Performed: Procedure(s) (LRB): LAPAROSCOPIC LOW ANTERIOR RESECTION (N/A) INSERTION OF MESH (N/A) RECTOPEXY (N/A)  Patient Location: PACU  Anesthesia Type: General  Level of Consciousness: awake and sedated  Airway & Oxygen Therapy: Patient Spontanous Breathing and Patient connected to face mask oxygen  Post-op Assessment: Report given to PACU RN, Post -op Vital signs reviewed and stable and Patient moving all extremities  Post vital signs: Reviewed and stable  Complications: No apparent anesthesia complications

## 2011-10-18 NOTE — H&P (Signed)
Desiree [name]  [birth year] [id number]  CARE TEAM:  PCP: Illene Regulus, MD  Outpatient Care Team: Patient Care Team: Jacques Navy, MD as PCP - General  Inpatient Treatment Team: Treatment Team: Attending Provider: Ardeth Sportsman, MD   This patient is a 78 y.o.female who presents today for surgical evaluation  This patient is a 78 y.o.female who presents today for surgical evaluation at the request of Dr. Debby Bud.   Reason for visit: Rectal prolapse.   As noted we will induce had issues of tissue protruding from the rectum. Severe enough to have chronic neck mucus drainage. Documented to have rectal prolapse. This is reduced. Never had a colonoscopy. Does struggle with some constipation. Was seen by primary care physician. He was concerned and recommended surgical evaluation. No new events.   Patient Active Problem List  Diagnosis  . HYPOTHYROIDISM  . DIABETES MELLITUS, TYPE II  . HYPERLIPIDEMIA  . ANEMIA  . DEMENTIA  . CATARACTS  . HYPERTENSION  . Allergic Rhinitis, Cause Unspecified  . GERD  . OSTEOARTHRITIS  . URINARY INCONTINENCE  . Routine health maintenance  . Rectal procidentia / prolapse  . Colonoscopy refused    Past Medical History  Diagnosis Date  . Dementia   . Diabetes mellitus     type  II- diet controlled  . Hyperlipidemia   . Hypertension   . Osteoarthritis     hands  . Allergic rhinitis   . Anemia   . GERD (gastroesophageal reflux disease)   . Hypothyroidism   . Urinary incontinence     stress  . Blood transfusion   . Osteoporosis   . Rectal prolapse   . Vision loss of right eye   . Glaucoma     bilateral  . Macular degeneration   . Hard of hearing     Past Surgical History  Procedure Date  . Carpal tunnel release     bilateral  . Cataract extraction 2006    left  . Abdominal hysterectomy     nonmalignant reasons  . Vein ligation and stripping     left leg  . Right eye surgery 7-00  . Lumbar fusion 3-01    spinal cord  leak- requiring 2 month admission    History   Social History  . Marital Status: Widowed    Spouse Name: N/A    Number of Children: N/A  . Years of Education: N/A   Occupational History  . homemaker    Social History Main Topics  . Smoking status: Former Games developer  . Smokeless tobacco: Never Used  . Alcohol Use: No  . Drug Use: No  . Sexually Active: No   Other Topics Concern  . Not on file   Social History Narrative   married 30 years -divorced; remarried 66 -widowed '83 . 1 daughter 1 son. 1 grandchild, 2 great grandchildren. 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 Terminal care issues: patient clearly states, daughter present, that she would not want heroic or futile care, e.g. CPR, mechanical vent. support, etc.    Family History  Problem Relation Age of Onset  . Other Mother     CVA/ grief  . Heart disease Mother   . Heart attack Father   . Heart disease Father   . Heart attack Brother 47  . Cancer Neg Hx     breast or colon    Current Facility-Administered Medications  Medication Dose Route Frequency Provider Last Rate Last Dose  .  alvimopan (ENTEREG) capsule 12 mg  12 mg Oral Once Ardeth Sportsman, MD   12 mg at 10/18/11 0600  . bupivacaine 0.25 % ON-Q pump DUAL CATH 300 mL  300 mL Other Continuous Ardeth Sportsman, MD      . cefTRIAXone (ROCEPHIN) 2 g in dextrose 5 % 50 mL IVPB  2 g Intravenous On Call to OR Ardeth Sportsman, MD      . gentamicin (GARAMYCIN) 240 mg, clindamycin (CLEOCIN) 900 mg in lactated ringers 1,000 mL irrigation   Irrigation Once Ardeth Sportsman, MD      . heparin injection 5,000 Units  5,000 Units Subcutaneous Once Ardeth Sportsman, MD   5,000 Units at 10/18/11 0600  . metroNIDAZOLE (FLAGYL) IVPB 500 mg  500 mg Intravenous On Call to OR Ardeth Sportsman, MD      . DISCONTD: cefTRIAXone (ROCEPHIN) 2 g in dextrose 5 % 50 mL IVPB  2 g Intravenous Once Ardeth Sportsman, MD      . DISCONTD: gentamicin  (GARAMYCIN) 240 mg, clindamycin (CLEOCIN) 900 mg in lactated ringers 1 mL irrigation   Irrigation Once Ardeth Sportsman, MD       Facility-Administered Medications Ordered in Other Encounters  Medication Dose Route Frequency Provider Last Rate Last Dose  . lactated ringers infusion    Continuous PRN Elisabeth Cara, CRNA         Allergies  Allergen Reactions  . Other     Bioxon=causes her skin to crawl. LACTOSE INTOLERANT.  Marland Kitchen Rofecoxib     REACTION: GI  . Sulfur Hives    ROS: Constitutional:  No fevers, chills, sweats.  Weight stable Eyes:  No vision changes, No discharge HENT:  No sore throats, nasal drainage Lymph: No neck swelling, No bruising easily Pulmonary:  No cough, productive sputum CV: No orthopnea, PND   No exertional chest/neck/shoulder/arm pain.  GI:  No personal nor family history of GI/colon cancer, inflammatory bowel disease, irritable bowel syndrome, allergy such as Celiac Sprue, dietary/dairy problems, colitis, ulcers nor gastritis.   No recent sick contacts/gastroenteritis.  No travel outside the country.  No changes in diet. Renal: No UTIs, No hematuria Genital:  No drainage, bleeding, masses Musculoskeletal: No severe joint pain.  Good ROM major joints Skin:  No sores or lesions.  No rashes Heme/Lymph:  No easy bleeding.  No swollen lymph nodes  BP 125/70  Pulse 80  Temp 97.9 F (36.6 C) (Oral)  Resp 16  SpO2 98%  Physical Exam: General: Pt awake/alert/oriented x4 in no major acute distress Eyes: PERRL, normal EOM. Sclera nonicteric Neuro: CN II-XII intact w/o focal sensory/motor deficits. Lymph: No head/neck/groin lymphadenopathy Psych:  No psychosis/paranoia HENT: Normocephalic, Mucus membranes moist.  No thrush Neck: Supple, No tracheal deviation Chest: No pain.  Good respiratory excursion. CV:  Pulses intact.  Regular rhythm Abdomen: Soft, Nondistended.  Nontender.  No incarcerated hernias. Ext:  SCDs BLE.  No significant edema.  No  cyanosis Skin: No petechiae / purpurae  Results:   Labs: Results for orders placed during the hospital encounter of 10/18/11 (from the past 48 hour(s))  ABO/RH     Status: Normal   Collection Time   10/18/11  6:10 AM      Component Value Range Comment   ABO/RH(D) A POS     GLUCOSE, CAPILLARY     Status: Abnormal   Collection Time   10/18/11  6:14 AM      Component Value Range Comment  Glucose-Capillary 107 (*) 70 - 99 mg/dL    Comment 1 Documented in Chart     TYPE AND SCREEN     Status: Normal   Collection Time   10/18/11  6:21 AM      Component Value Range Comment   ABO/RH(D) A POS      Antibody Screen NEG      Sample Expiration 10/21/2011       Imaging / Studies: Dg Chest 2 View  10/10/2011  *RADIOLOGY REPORT*  Clinical Data: Preop  CHEST - 2 VIEW  Comparison: 10/04/2011 and 12/29/2003  Findings: Cardiomediastinal silhouette is stable.  No acute infiltrate or pleural effusion.  No pulmonary edema.  Mild degenerative changes thoracic spine.  Contrast material from recent barium enema noted within colon.  Colonic diverticula are noted. Stable mild degenerative changes thoracic spine.  IMPRESSION: No active disease.  Residual contrast material within colon. Colonic diverticula are noted.  Original Report Authenticated By: Natasha Mead, M.D.   Dg Colon W/cm - Wo/w Kub  10/04/2011  *RADIOLOGY REPORT*  Clinical Data: Evaluate rectal prolapse.  SINGLE COLUMN BARIUM ENEMA  Technique:  Initial scout AP supine abdominal image obtained to insure adequate colon cleansing.  Barium was introduced into the colon in a retrograde fashion and refluxed from the rectum to the cecum. Spot images of the colon followed by overhead radiographs were obtained.  Fluoroscopy time: 3.1 minutes.  Comparison:  None  Findings:  Under gravity there was retrograde opacification of the colon. With the exception of the proximal ascending colon and cecum there was complete opacification of the entire colon.    Due to patient  age and limited mobility the rectal tip was expelled just as air was to be introduced and the exam was changed to a single contrast study.  Extensive diverticular disease involves the sigmoid colon  No high-grade stricture or mass identified.  Dynamic imaging of the rectum was performed in the left lateral orientation confirming presence of rectal prolapse.  IMPRESSION:  1.  Rectal prolapse. 2.  Sigmoid diverticulosis.  Original Report Authenticated By: Rosealee Albee, M.D.    Medications / Allergies: per chart  Antibiotics: Anti-infectives     Start     Dose/Rate Route Frequency Ordered Stop   10/18/11 0715   cefTRIAXone (ROCEPHIN) 2 g in dextrose 5 % 50 mL IVPB  Status:  Discontinued        2 g 100 mL/hr over 30 Minutes Intravenous  Once 10/18/11 0702 10/18/11 0702   10/18/11 0715   gentamicin (GARAMYCIN) 240 mg, clindamycin (CLEOCIN) 900 mg in lactated ringers 1 mL irrigation  Status:  Discontinued         Irrigation  Once 10/18/11 0708 10/18/11 0711   10/18/11 0715   gentamicin (GARAMYCIN) 240 mg, clindamycin (CLEOCIN) 900 mg in lactated ringers 1,000 mL irrigation         Irrigation  Once 10/18/11 0711     10/18/11 0600   cefTRIAXone (ROCEPHIN) 2 g in dextrose 5 % 50 mL IVPB     Comments: Pharmacy may adjust dosing strength per protocol      2 g 100 mL/hr over 30 Minutes Intravenous On call to O.R. 10/18/11 0528 10/19/11 0559   10/18/11 0600   metroNIDAZOLE (FLAGYL) IVPB 500 mg        500 mg 100 mL/hr over 60 Minutes Intravenous On call to O.R. 10/18/11 0528 10/19/11 0559          Assessment  Desiree [name]  78 y.o. female  Day of Surgery  Procedure(s): LAPAROSCOPIC LOW ANTERIOR RESECTION INSERTION OF MESH  Problem List:  Principal Problem:  *Rectal procidentia / prolapse Active Problems:  DIABETES MELLITUS, TYPE II  DEMENTIA  HYPERTENSION  GERD  URINARY INCONTINENCE  Colonoscopy refused  Rectal prolapse  Plan:  Lap rectal prolapse repair:  The anatomy  & physiology of the digestive tract was discussed.  The pathophysiology of rectal prolapse was discussed.  Natural history risks without surgery was discussed.   I feel the risks of no intervention will lead to serious problems that outweigh the operative risks; therefore, I recommended surgery to treat the pathology.  Possible need for sigmoid colectomy to remove redundant colon was discussed.  Pexy by suture and probable mesh reinforcement was discussed as well.  Laparoscopic & open techniques were discussed.   Risks such as bleeding, infection, abscess, leak, reoperation, possible ostomy, hernia, heart attack, death, and other risks were discussed.   I noted a good likelihood this will help address the problem.  Goals of post-operative recovery were discussed as well.  We will work to minimize complications.  An educational handout on the technique was given as well.  Questions were answered.  The patient expresses understanding & wishes to proceed with surgery.   Ardeth Sportsman, M.D., F.A.C.S. Gastrointestinal and Minimally Invasive Surgery [facility] [address], [address] [place], [place] [address] [phone] Main / Paging [phone] Voice Mail   10/18/2011

## 2011-10-18 NOTE — Preoperative (Signed)
Beta Blockers   Reason not to administer Beta Blockers:Not Applicable 

## 2011-10-18 NOTE — Anesthesia Postprocedure Evaluation (Signed)
  Anesthesia Post-op Note  Patient: Desiree [name]  Procedure(s) Performed: Procedure(s) (LRB): LAPAROSCOPIC LOW ANTERIOR RESECTION (N/A) INSERTION OF MESH (N/A) RECTOPEXY (N/A)  Patient Location: PACU  Anesthesia Type: General  Level of Consciousness: awake and alert   Airway and Oxygen Therapy: Patient Spontanous Breathing  Post-op Pain: mild  Post-op Assessment: Post-op Vital signs reviewed, Patient's Cardiovascular Status Stable, Respiratory Function Stable, Patent Airway and No signs of Nausea or vomiting  Post-op Vital Signs: stable  Complications: No apparent anesthesia complications

## 2011-10-18 NOTE — Op Note (Signed)
10/18/2011  11:53 AM  PATIENT:  Desiree [name]  78 y.o. female  Patient Care Team: Jacques Navy, MD as PCP - General  PRE-OPERATIVE DIAGNOSIS:  rectal prolapse  POST-OPERATIVE DIAGNOSIS:  rectal prolapse  PROCEDURE:  Procedure(s): LAPAROSCOPIC LOW ANTERIOR RESECTION INSERTION OF MESH RECTOPEXY  SURGEON:  Surgeon(s): Ardeth Sportsman, MD  ASSISTANT: Romie Levee, MD   ANESTHESIA:   local and general  EBL:  Total I/O In: 2400 [I.V.:2400] Out: 450 [Urine:400; Blood:50]  Delay start of Pharmacological VTE agent (>24hrs) due to surgical blood loss or risk of bleeding:  no  DRAINS: (19) Blake drain(s) in the pelvis   SPECIMEN:  Source of Specimen:  Rectosigmoid w anastomotic rings (open end proximal).  Anastomotic rings (blue stitch in proximal ring)  DISPOSITION OF SPECIMEN:  PATHOLOGY  COUNTS:  YES  PLAN OF CARE: Admit to inpatient   PATIENT DISPOSITION:  PACU - hemodynamically stable.  INDICATION: Pleasant elderly female with worsening rectal prolapse with incontinence and pain and discomfort.  She was sent to me by her primary care physician.  Enema reveals no abnormal polyps.  She was declined colonoscopy.  I recommended surgical resection of redundant colon and rectopexy:  The anatomy & physiology of the digestive tract was discussed.  The pathophysiology of rectal prolapse was discussed.  Natural history risks without surgery was discussed.   I feel the risks of no intervention will lead to serious problems that outweigh the operative risks; therefore, I recommended surgery to treat the pathology.  Possible need for sigmoid colectomy to remove redundant colon was discussed.  Pexy by suture and probable mesh reinforcement was discussed as well.  Laparoscopic & open techniques were discussed.   Risks such as bleeding, infection, abscess, leak, reoperation, possible ostomy, hernia, heart attack, death, and other risks were discussed.   I noted a good likelihood this  will help address the problem.  Goals of post-operative recovery were discussed as well.  We will work to minimize complications.  An educational handout on the technique was given as well.  Questions were answered.  The patient expresses understanding & wishes to proceed with surgery.  OR FINDINGS: She had a very redundant sigmoid colon.  About 2 feet of redundant distal colon was resected.  The anastomosis itself 12 cm from the anal verge.  It is a 33 EEA stapled anastomosis.  DESCRIPTION:   Informed consent was confirmed.  Patient underwent general anesthesia without any difficulty & was positioned in low lithotomy with arms tucked.  The patient had a Foley catheter sterilely placed.  The abdomen and perineum were prepped and draped in sterile fashion.  Surgical time-out confirmed our plan.  I placed a #5-mm port in the right left upper quadrant using optical entry technique with the patient in steep reverse Trendelenburg and left side up.  The patient had orogastric tube for decompression.  Entry was clean.  We induced carbon dioxide insufflation.  Camera inspection noted no injury nor adhesions.  I placed a few more 5-mm ports .  We positioned the patient head down and evacuated the colon and the small bowel out of the pelvis.  I scored the peritoneum at the rectosigmoid region at the level of the sacral promontory and continued scoring of peritoneum using hook cautery down to the right peritoneal reflection.  I left the rectosigmoid mesentery anteriorly and got into the presacral plane.  I followed blunt dissection down the sacrum and down towards the levators for good posterior rectal mobilzation.  I dissected more proximally.  I elevated the sigmoid mesentery a little bit.  I identified the left ureter, kept that posteriorly in the retroperitioneum.  I identified the left gonadals and kept those posterior as well.  I tried to avoid any more dissection more proximally up the  left colon to avoid disturbing any further adhesions.  I then took some of the peritoneal reflection on the left rectosigmoid side going up the pelvic brim to the anterior reflection.  I then came around anteriorly to help mobilize the anterior rectum off its anterior attachments for good anterior rectal mobilization.  With that, I could get the mid rectum to stretch up to the sacral promontory to help straighten out the stretched out rectum. I kept the lateral pedicles intact on both sides at mid and distal rectum.  I created a window between the proximal and mid rectal mesorectum where it reached to the sacral promontory under some mild tension.  I made a 6 cm suprapubic incision through a Pfannenstiel incision to place a wound protector.  I transected the rectum at the proximal/midrectal junction using a contour stapler.  I took the rectum off the rectosigmoid mesentery close to the bowel to help preserve the superior rectal artery arcade.  I took that more proximally up towards the distal descending colon.  That actually reached down quite easily.   I clamped proximally in the descending colon.  I opened up at the descending/sigmoid junction and transected across and getting healthy pink bleeding tissue consistent with a viable proximal end.  We did sizers and they easily fit.  I placed an anvil with a 33 EEA stapler into the opening with a proximal end of the descending colon.  I closed that with a 0 Prolene pursestring stitch.  Next, I assured hemostasis down the pelvis.  I took a 10x10cm Strattice biologic mesh, trimmed it to 10x8cm,  and placed 2-0 PDS stitches in 2 pairs & 2-0 Prolene at the proximal corners, 3 to each of the long sides.  I tacked that to the midsacrum using a spiral tacker along the midline.  I had good hemostasis.  I put tension on the rectal stump and placed 2-0 stitches in the distal rectal stump on the most distal pair of stitches to get a good posterior  mesh rectopexy.  I then placed the middle pair of stitches along the proximal distal colon as well.  I held those, clamped, but not tied down.  Next, Dr. Maisie Fus scrubbed out and did gentle rectal dilation and plasty, 33 EEA stapler up the rectal stump.  She brought the spike out the rectal stump under direct visualization.  We attached the anvil of the descending colon onto the spike of the stapler.  We clamped that down taking care to avoid the stitches and any involvement of other organs.  The vaginal cuff & adnexa was retracted out of the way.  She held the clamp for 60 seconds. She fired the stapler.  She held the clamp for 30 seconds and released.  We noted 2 excellent anastomotic rings.  We did copious irrigation of the pelvis to ensure hemostasis.  We transitioned over to antibiotic irrigation(clindamycin/gentamicin 900/240 mg in 1 L of saline).  I held that in place for irrigation.  I clamped the bowel of proximal anastomosis.  Dr. Maisie Fus performed rigid proctoscopy and confirmed that the anastomosis is 12 cm from the anal verge.  There was no active bleeding at the staple line.  There was  no leak of air under water.  That was consistent with a negative air leak test.  Tissue looked viable.  I evacuated the rectum and I removed with clamp.  We changed gloves & scrubbed back up above.  I assured hemostasis in the pelvis and did more irrigation of antibiotics.  I tied down the distal & mid pairs of 2-0 stitches to help do the posterior mesh rectopexy in the mid rectum.  The left proximal stitch went through the rectosigmoid anastomosis and tied down well.  At the right proximal stitch, I went through the periosteum of the sacral promontory and then to the descending colon, just proximal to the anastomosis and tied that down for a good suture rectopexy as well.  Hemostasis excellent.  I placed a drain as noted above.  I secured the drain stitch with 2-0 nylon stitch.  We inspected.   Hemostasis was good.  I closed the Pfannenstiel incision using 0 Vicryl posterior rectal fascia/peritoneum running stitches including some rectus muscle to help reapproximate to the midline.  I closed the  anterior rectus fascia transversely using #1 running PDS.  I closed the skin using 4-0 Monocryl stitch with a few gaps to allow some Betadine- soaked wicks in the wound.  The catheters were placed through the On-Q sheath and the sheath was peeled away.  I secured that with Steri-Strips and sterile dressing.  Patient is extubated to go to recovery room.  I discussed postop care in detail with the patient and the daughter in the office.  I discussed in the holding area.  I am about to discussed it with the daughter & son in the waiting conference room.  Instructions are written.

## 2011-10-19 DIAGNOSIS — K623 Rectal prolapse: Secondary | ICD-10-CM

## 2011-10-19 LAB — BASIC METABOLIC PANEL
BUN: 9 mg/dL (ref 6–23)
CO2: 29 mEq/L (ref 19–32)
Calcium: 8.5 mg/dL (ref 8.4–10.5)
Chloride: 100 mEq/L (ref 96–112)
Creatinine, Ser: 0.74 mg/dL (ref 0.50–1.10)
GFR calc Af Amer: 85 mL/min — ABNORMAL LOW (ref >90)

## 2011-10-19 LAB — CBC
HCT: 27.4 % — ABNORMAL LOW (ref 36.0–46.0)
MCHC: 33.6 g/dL (ref 30.0–36.0)
MCV: 85.4 fL (ref 78.0–100.0)
Platelets: 203 10*3/uL (ref 150–400)
RDW: 14 % (ref 11.5–15.5)

## 2011-10-19 NOTE — Progress Notes (Signed)
Desiree [name] [id number] [birth year]  CARE TEAM:  PCP: Illene Regulus, MD  Outpatient Care Team: Patient Care Team: Jacques Navy, MD as PCP - General  Inpatient Treatment Team: Treatment Team: Attending Provider: Ardeth Sportsman, MD; Registered Nurse: Earnestine Leys, RN; Technician: Lynden Ang, NT; Registered Nurse: Bethann Goo, RN; Respiratory Therapist: Renold Genta, RRT; Technician: Vella Raring, NT  Subjective:  No events Pain minimal & controlled w IV Dilaudid Try PO liq breakfast No Nausea  Objective:  Vital signs:  Filed Vitals:   10/18/11 1715 10/18/11 2103 10/19/11 0147 10/19/11 0526  BP: 103/54 101/60 96/56 90/55   Pulse: 95 95 84 81  Temp: 97.7 F (36.5 C) 98.9 F (37.2 C) 98.6 F (37 C)   TempSrc: Oral Oral Oral Oral  Resp: 16 16 14 16   Height: 5\' 3"  (1.6 m)     Weight: 123 lb (55.792 kg)     SpO2: 99% 98% 96% 97%       Intake/Output   Yesterday:  08/08 0701 - 08/09 0700 In: 4018.8 [I.V.:3818.8; IV Piggyback:200] Out: 1650 [Urine:1390; Drains:210; Blood:50] This shift:     Bowel function:  Flatus: Yes  BM: Thin, loose  Physical Exam:  General: Pt awake, oriented x4 in no acute distress Eyes: PERRL, normal EOM.  Sclera clear.  No icterus Neuro: CN II-XII intact w/o focal sensory/motor deficits. Lymph: No head/neck/groin lymphadenopathy Psych:  No delerium/psychosis/paranoia.  Slow but her baseline HENT: Normocephalic, Mucus membranes moist.  No thrush.  Still HOH Neck: Supple, No tracheal deviation Chest: No chest wall pain w good excursion CV:  Pulses intact.  Regular rhythm Abdomen: Soft.  Nondistended.  Mildly tender at incisions only.  No incarcerated hernias.  Dressing c/d/i Ext:  SCDs BLE.  No mjr edema.  No cyanosis.  SCDs BLE Skin: No petechiae / purpurae  Problem List:  Principal Problem:  *Rectal procidentia / prolapse Active Problems:  DIABETES MELLITUS, TYPE II  DEMENTIA  HYPERTENSION  GERD  URINARY INCONTINENCE  Colonoscopy refused   Assessment  Desiree [name]  78 y.o. female  1 Day Post-Op  Procedure(s): LAPAROSCOPIC LOW ANTERIOR RESECTION INSERTION OF MESH RECTOPEXY  Stable  Plan:  -try liquids & adv diet slowly.  Watch out for ileus.  Continue anti-ileus regimen -anemia stable - follow -glc well controlled - follow -PT/OT/mobilize as tolerated to help recovery -Watch out for dementia/sundowning -VTE prophylaxis- SCDs, etc   Ardeth Sportsman, M.D., F.A.C.S. Gastrointestinal and Minimally Invasive Surgery [facility] [address] [place], [place] [address] [phone] Main / Paging [phone] Voice Mail   10/19/2011  Results:   Labs: Results for orders placed during the hospital encounter of 10/18/11 (from the past 48 hour(s))  ABO/RH     Status: Normal   Collection Time   10/18/11  6:10 AM      Component Value Range Comment   ABO/RH(D) A POS     GLUCOSE, CAPILLARY     Status: Abnormal   Collection Time   10/18/11  6:14 AM      Component Value Range Comment   Glucose-Capillary 107 (*) 70 - 99 mg/dL    Comment 1 Documented in Chart     TYPE AND SCREEN     Status: Normal   Collection Time   10/18/11  6:21 AM      Component Value Range Comment   ABO/RH(D) A POS      Antibody Screen NEG  Sample Expiration 10/21/2011     CBC     Status: Abnormal   Collection Time   10/18/11  3:19 PM      Component Value Range Comment   WBC 20.6 (*) 4.0 - 10.5 K/uL    RBC 3.48 (*) 3.87 - 5.11 MIL/uL    Hemoglobin 9.9 (*) 12.0 - 15.0 g/dL    HCT 78.4 (*) 69.6 - 46.0 %    MCV 85.6  78.0 - 100.0 fL    MCH 28.4  26.0 - 34.0 pg    MCHC 33.2  30.0 - 36.0 g/dL    RDW 29.5  28.4 - 13.2 %    Platelets 245  150 - 400 K/uL   CREATININE, SERUM     Status: Abnormal   Collection Time   10/18/11  3:19 PM      Component Value Range Comment   Creatinine, Ser 0.63  0.50 - 1.10 mg/dL    GFR calc non Af Amer 78 (*) >90 mL/min     GFR calc Af Amer >90  >90 mL/min   BASIC METABOLIC PANEL     Status: Abnormal   Collection Time   10/19/11  5:15 AM      Component Value Range Comment   Sodium 135  135 - 145 mEq/L    Potassium 4.0  3.5 - 5.1 mEq/L    Chloride 100  96 - 112 mEq/L    CO2 29  19 - 32 mEq/L    Glucose, Bld 108 (*) 70 - 99 mg/dL    BUN 9  6 - 23 mg/dL    Creatinine, Ser 4.40  0.50 - 1.10 mg/dL    Calcium 8.5  8.4 - 10.2 mg/dL    GFR calc non Af Amer 74 (*) >90 mL/min    GFR calc Af Amer 85 (*) >90 mL/min   CBC     Status: Abnormal   Collection Time   10/19/11  5:15 AM      Component Value Range Comment   WBC 14.9 (*) 4.0 - 10.5 K/uL    RBC 3.21 (*) 3.87 - 5.11 MIL/uL    Hemoglobin 9.2 (*) 12.0 - 15.0 g/dL    HCT 72.5 (*) 36.6 - 46.0 %    MCV 85.4  78.0 - 100.0 fL    MCH 28.7  26.0 - 34.0 pg    MCHC 33.6  30.0 - 36.0 g/dL    RDW 44.0  34.7 - 42.5 %    Platelets 203  150 - 400 K/uL     Imaging / Studies: No results found.  Medications / Allergies: per chart  Antibiotics: Anti-infectives     Start     Dose/Rate Route Frequency Ordered Stop   10/18/11 1500   metroNIDAZOLE (FLAGYL) IVPB 500 mg        500 mg 100 mL/hr over 60 Minutes Intravenous Every 6 hours 10/18/11 1423     10/18/11 0715   cefTRIAXone (ROCEPHIN) 2 g in dextrose 5 % 50 mL IVPB  Status:  Discontinued        2 g 100 mL/hr over 30 Minutes Intravenous  Once 10/18/11 0702 10/18/11 0702   10/18/11 0715   gentamicin (GARAMYCIN) 240 mg, clindamycin (CLEOCIN) 900 mg in lactated ringers 1 mL irrigation  Status:  Discontinued         Irrigation  Once 10/18/11 0708 10/18/11 0711   10/18/11 0715   gentamicin (GARAMYCIN) 240 mg, clindamycin (CLEOCIN) 900 mg in lactated ringers  1,000 mL irrigation         Irrigation  Once 10/18/11 0711     10/18/11 0600   cefTRIAXone (ROCEPHIN) 2 g in dextrose 5 % 50 mL IVPB     Comments: Pharmacy may adjust dosing strength per protocol      2 g 100 mL/hr over 30 Minutes Intravenous On call to O.R.  10/18/11 1610 10/18/11 0754   10/18/11 0600   metroNIDAZOLE (FLAGYL) IVPB 500 mg        500 mg 100 mL/hr over 60 Minutes Intravenous On call to O.R. 10/18/11 9604 10/18/11 5409

## 2011-10-19 NOTE — Evaluation (Signed)
Physical Therapy Evaluation Patient Details Name: Desiree [name] MRN: [id number] DOB: 1920/09/14 Today's Date: 10/19/2011 Time: 8295-6213 PT Time Calculation (min): 27 min  PT Assessment / Plan / Recommendation Clinical Impression   POD #1 after repair of rectal prolapse; willl benefit form PT to allow return to prior level of function    PT Assessment  Patient needs continued PT services    Follow Up Recommendations  Home health PT    Barriers to Discharge        Equipment Recommendations  Rolling walker with 5" wheels;3 in 1 bedside comode    Recommendations for Other Services     Frequency Min 3X/week    Precautions / Restrictions Precautions Precautions: Fall Restrictions Weight Bearing Restrictions: No   Pertinent Vitals/Pain       Mobility  Bed Mobility Bed Mobility: Rolling Left;Left Sidelying to Sit Rolling Left: 4: Min assist Left Sidelying to Sit: 4: Min assist;With rails;HOB elevated Details for Bed Mobility Assistance: cues for technique with log roll. Transfers Sit to Stand: 4: Min assist;With upper extremity assist;From bed Stand to Sit: 4: Min assist;With upper extremity assist;With armrests;To chair/3-in-1 Details for Transfer Assistance: Min VCs for hand placement.    Exercises     PT Diagnosis: Difficulty walking  PT Problem List: Decreased activity tolerance;Decreased knowledge of use of DME;Decreased mobility PT Treatment Interventions: DME instruction;Gait training;Stair training;Functional mobility training;Therapeutic activities;Therapeutic exercise;Balance training;Patient/family education   PT Goals Acute Rehab PT Goals PT Goal Formulation: With patient Time For Goal Achievement: 11/02/11 Potential to Achieve Goals: Good Pt will go Supine/Side to Sit: with supervision PT Goal: Supine/Side to Sit - Progress: Goal set today Pt will go Sit to Stand: with supervision PT Goal: Sit to Stand - Progress: Goal set today Pt will Ambulate: 51 -  150 feet;with least restrictive assistive device;with supervision PT Goal: Ambulate - Progress: Goal set today Pt will Go Up / Down Stairs: 3-5 stairs;with least restrictive assistive device;with min assist PT Goal: Up/Down Stairs - Progress: Goal set today  Visit Information  Last PT Received On: 10/19/11 Assistance Needed: +1 PT/OT Co-Evaluation/Treatment: Yes    Subjective Data  Subjective: i will try Patient Stated Goal: stronger   Prior Functioning  Home Living Lives With: Alone Available Help at Discharge: Family;Available 24 hours/day;Other (Comment) (Pt will be staying with daughter) Type of Home: House Home Access: Stairs to enter Entrance Stairs-Number of Steps: 3 Entrance Stairs-Rails: Right;Left Home Layout: Two level;Able to live on main level with bedroom/bathroom Bathroom Shower/Tub: Walk-in shower;Tub/shower unit Charity fundraiser with back;Straight cane Prior Function Level of Independence: Needs assistance Needs Assistance: Light Housekeeping Light Housekeeping: Total Able to Take Stairs?: Yes Driving: No Vocation: Retired Musician: No difficulties Dominant Hand: Right    Cognition  Overall Cognitive Status: Appears within functional limits for tasks assessed/performed Arousal/Alertness: Awake/alert Orientation Level: Appears intact for tasks assessed Behavior During Session: [place] for tasks performed    Extremity/Trunk Assessment Right Upper Extremity Assessment RUE ROM/Strength/Tone: [facility] for tasks assessed Left Upper Extremity Assessment LUE ROM/Strength/Tone: [facility] for tasks assessed Right Lower Extremity Assessment RLE ROM/Strength/Tone: [facility] for tasks assessed Left Lower Extremity Assessment LLE ROM/Strength/Tone: [facility] for tasks assessed   Balance Balance Balance Assessed: Yes Static Sitting Balance Static Sitting - Balance Support: No upper extremity supported;Feet  supported Static Sitting - Level of Assistance: 5: Stand by assistance  End of Session PT - End of Session Equipment Utilized During Treatment: Gait belt Activity Tolerance: Patient tolerated  treatment well Patient left: in chair;with call bell/phone within reach;with family/visitor present  GP     [facility] 10/19/2011, 3:32 PM

## 2011-10-19 NOTE — Progress Notes (Signed)
Subjective: Desiree [name] was admitted for repair of rectal prolapse - surgery August 8th. She is easily awakened. She denies pain or nausea.   Objective: Lab: Lab Results  Component Value Date   WBC 14.9* 10/19/2011   HGB 9.2* 10/19/2011   HCT 27.4* 10/19/2011   MCV 85.4 10/19/2011   PLT 203 10/19/2011   BMET    Component Value Date/Time   NA 135 10/19/2011 0515   K 4.0 10/19/2011 0515   CL 100 10/19/2011 0515   CO2 29 10/19/2011 0515   GLUCOSE 108* 10/19/2011 0515   BUN 9 10/19/2011 0515   CREATININE 0.74 10/19/2011 0515   CALCIUM 8.5 10/19/2011 0515   GFRNONAA 74* 10/19/2011 0515   GFRAA 85* 10/19/2011 0515     Imaging:  Scheduled Meds:   . alvimopan  12 mg Oral BID  . amLODipine  10 mg Oral Daily  . antiseptic oral rinse  15 mL Mouth Rinse BID  . aspirin  81 mg Oral QHS  . brimonidine  2 drop Right Eye BID  . calcium carbonate  2 tablet Oral Daily  . cefTRIAXone (ROCEPHIN)  IV  2 g Intravenous On Call to OR  . irrigation builder   Irrigation Once  . heparin  5,000 Units Subcutaneous Q8H  . levothyroxine  100 mcg Oral QAC breakfast  . lip balm  1 application Topical BID  . loratadine  10 mg Oral Daily  . losartan  100 mg Oral Daily  . metronidazole  500 mg Intravenous On Call to OR  . metronidazole  500 mg Intravenous Q6H  . multivitamin with minerals  1 tablet Oral Daily  . naproxen  250 mg Oral BID WC  . pantoprazole  80 mg Oral Q1200  . psyllium  1 packet Oral BID  . saccharomyces boulardii  250 mg Oral BID  . vitamin C  500 mg Oral BID  . vitamin E  1,000 Units Oral Daily  . DISCONTD: aspirin  81 mg Oral QHS  . DISCONTD: multivitamin  1 tablet Oral Daily  . DISCONTD: naproxen sodium  220 mg Oral BID WC   Continuous Infusions:   . lactated ringers 75 mL/hr at 10/19/11 0329  . DISCONTD: bupivacaine 0.25 % ON-Q pump DUAL CATH 300 mL    . DISCONTD: lactated ringers    . DISCONTD: lactated ringers     PRN Meds:.acetaminophen, acetaminophen, alum & mag hydroxide-simeth,  diphenhydrAMINE, HYDROmorphone (DILAUDID) injection, lactated ringers, magic mouthwash, metoprolol tartrate, ondansetron (ZOFRAN) IV, ondansetron, promethazine, DISCONTD: bupivacaine-EPINEPHrine, DISCONTD: fentaNYL, DISCONTD: lactated ringers, DISCONTD: lactated ringers, DISCONTD: promethazine, DISCONTD: sodium chloride irrigation, DISCONTD: sterile water for irrigation   Physical Exam: Filed Vitals:   10/19/11 0526  BP: 90/55  Pulse: 81  Temp:   Resp: 16   Elderly white woman who seems comfortable HEENT- C&S clear clear Cor - 2+ radial pulse, RRR, III/VI systolic murmur best at RSB Pulm - normal respirations, no increased WOB, no rales Abd- JP drain with 20 cc serosanguinous fluid, positive BS, no guarding Neuro - Awake and alert     Assessment/Plan: 1. GS- POD #1 after repair of rectal prolapse. Has a clear liquid tray at bedside Plan - per Dr. Michaell Cowing  Encouraged use of IS every hour  2. DM - blood sugars are good.   3. HTN - BP is soft. Plan -  Hold losartan until BP is higher  4. Anemia - Hgb down almost 2 g from July 31.  Plan -  F/u CBC  5. Leukocytosis - WBC down from 20.6 to 14.9. No clear evidence of infection Plan F/u CBCD  IS  Casimiro Needle [facility] [place] [name] (o) [phone]; [phone]  10/19/2011, 7:38 AM

## 2011-10-19 NOTE — Progress Notes (Signed)
CSW consulted for SNF placement. PN reviewed. PT recommends HHPT following hospital d/c. CSW met with pt/family  to confirm d/c plans. Pt/family are in agreement with d/c home with [facility]. Pt will d/c to daughter's for about a week prior to returning to her own home where she lives alone. RNCM will assist with d/c planning needs.  Cori Razor LCSW [phone]

## 2011-10-19 NOTE — Evaluation (Signed)
Occupational Therapy Evaluation Patient Details Name: Desiree [name] MRN: [id number] DOB: 07-13-1920 Today's Date: 10/19/2011 Time: 4098-1191 OT Time Calculation (min): 25 min  OT Assessment / Plan / Recommendation Clinical Impression  Pt is an 78 yo female s/p rectal prolapse repair. Skilled OT recommended to maximize independence with BADLs to supervision level in prep for safe d/c home with daughter and HHOT.    OT Assessment  Patient needs continued OT Services    Follow Up Recommendations  Home health OT;Supervision/Assistance - 24 hour    Barriers to Discharge      Equipment Recommendations  Rolling walker with 5" wheels;3 in 1 bedside comode    Recommendations for Other Services    Frequency  Min 2X/week    Precautions / Restrictions Precautions Precautions: Fall Restrictions Weight Bearing Restrictions: No   Pertinent Vitals/Pain     ADL  Grooming: Performed;Wash/dry face;Brushing hair Where Assessed - Grooming: Unsupported sitting Upper Body Bathing: Simulated;Set up Where Assessed - Upper Body Bathing: Unsupported sitting Lower Body Bathing: Simulated;Minimal assistance Where Assessed - Lower Body Bathing: Supported sit to stand Upper Body Dressing: Simulated;Set up Where Assessed - Upper Body Dressing: Unsupported sitting Lower Body Dressing: Simulated;Moderate assistance Where Assessed - Lower Body Dressing: Supported sit to Pharmacist, hospital: Mining engineer Method: Sit to Barista: Other (comment) (recliner) Toileting - Clothing Manipulation and Hygiene: Performed;Maximal assistance (for back peri area) Where Assessed - Toileting Clothing Manipulation and Hygiene: Standing Equipment Used: Standard walker Transfers/Ambulation Related to ADLs: Pt ambulated ~80 feet with minguard A and std walker. Tolerated well. ADL Comments: Pt limited by low vision 2* macular degeneration.    OT Diagnosis:  Generalized weakness  OT Problem List: Decreased activity tolerance;Impaired vision/perception;Decreased knowledge of use of DME or AE OT Treatment Interventions: Self-care/ADL training;Therapeutic activities;DME and/or AE instruction;Patient/family education   OT Goals Acute Rehab OT Goals OT Goal Formulation: With patient Time For Goal Achievement: 11/02/11 Potential to Achieve Goals: Good ADL Goals Pt Will Perform Grooming: with supervision;Standing at sink ADL Goal: Grooming - Progress: Goal set today Pt Will Perform Lower Body Bathing: with supervision;Sit to stand from chair;Sit to stand from bed ADL Goal: Lower Body Bathing - Progress: Goal set today Pt Will Perform Lower Body Dressing: with supervision;Sit to stand from bed;Sit to stand from chair ADL Goal: Lower Body Dressing - Progress: Goal set today Pt Will Transfer to Toilet: with supervision;Ambulation;Regular height toilet;Comfort height toilet;3-in-1 ADL Goal: Toilet Transfer - Progress: Goal set today Pt Will Perform Toileting - Clothing Manipulation: with supervision;Standing ADL Goal: Toileting - Clothing Manipulation - Progress: Goal set today Pt Will Perform Toileting - Hygiene: with supervision;Sit to stand from 3-in-1/toilet ADL Goal: Toileting - Hygiene - Progress: Goal set today Pt Will Perform Tub/Shower Transfer: Shower transfer;with supervision;Ambulation;Shower seat with back ADL Goal: Tub/Shower Transfer - Progress: Goal set today  Visit Information  Last OT Received On: 10/19/11 Assistance Needed: +1 PT/OT Co-Evaluation/Treatment: Yes    Subjective Data  Subjective: 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 Patient Stated Goal: Return home with daughter upon d/c.   Prior Functioning  Vision/Perception  Home Living Lives With: Alone Available Help at Discharge: Family;Available 24 hours/day;Other (Comment) (Pt will be staying with daughter) Type of Home: House Home Access: Stairs to enter Entrance  Stairs-Number of Steps: 3 Entrance Stairs-Rails: Right;Left Home Layout: Two level;Able to live on main level with bedroom/bathroom Bathroom Shower/Tub: Walk-in shower;Tub/shower unit [employer]: Standard Home Adaptive Equipment: Paediatric nurse with back;Straight cane  Prior Function Level of Independence: Needs assistance Needs Assistance: Light Housekeeping Light Housekeeping: Total Able to Take Stairs?: Yes Driving: No Vocation: Retired Musician: No difficulties Dominant Hand: Right      Cognition  Overall Cognitive Status: Appears within functional limits for tasks assessed/performed Arousal/Alertness: Awake/alert Orientation Level: Appears intact for tasks assessed Behavior During Session: [facility] for tasks performed    Extremity/Trunk Assessment Right Upper Extremity Assessment RUE ROM/Strength/Tone: [facility] for tasks assessed Left Upper Extremity Assessment LUE ROM/Strength/Tone: WFL for tasks assessed   Mobility Bed Mobility Bed Mobility: Rolling Left;Left Sidelying to Sit Rolling Left: 4: Min assist Left Sidelying to Sit: 4: Min assist;With rails;HOB elevated Details for Bed Mobility Assistance: cues for technique with log roll. Transfers Transfers: Sit to Stand;Stand to Sit Sit to Stand: 4: Min assist;With upper extremity assist;From bed Stand to Sit: 4: Min assist;With upper extremity assist;With armrests;To chair/3-in-1 Details for Transfer Assistance: Min VCs for hand placement.   Exercise    Balance Balance Balance Assessed: Yes Static Sitting Balance Static Sitting - Balance Support: No upper extremity supported;Feet supported Static Sitting - Level of Assistance: 5: Stand by assistance  End of Session OT - End of Session Activity Tolerance: Patient tolerated treatment well Patient left: in chair;with call bell/phone within reach  GO     Atlee Kluth A OTR/L [phone] 10/19/2011, 2:44 PM

## 2011-10-19 NOTE — Care Management Note (Addendum)
    Page 1 of 2   10/22/2011     12:20:56 PM   CARE MANAGEMENT NOTE 10/22/2011  Patient:  YARED, SUSAN   Account Number:  [id number]  Date Initiated:  10/19/2011  Documentation initiated by:  Lorenda Ishihara  Subjective/Objective Assessment:   78 yo female admitted s/p LAR. PTA lived at home alone.     Action/Plan:   Anticipate SNF, awaiting PT evals   Anticipated DC Date:  10/22/2011   Anticipated DC Plan:  HOME W HOME HEALTH SERVICES  In-house referral  Clinical Social Worker      DC Associate Professor  CM consult      [facility] Choice  HOME HEALTH   Choice offered to / List presented to:  C-4 Adult Children   DME arranged  3-N-1  Levan Hurst      DME agency  [facility]     HH arranged  HH-1 RN  HH-2 PT  HH-3 OT      [facility] agency  [facility]   Status of service:  Completed, signed off Medicare Important Message given?   (If response is "NO", the following Medicare IM given date fields will be blank) Date Medicare IM given:   Date Additional Medicare IM given:    Discharge Disposition:  HOME W HOME HEALTH SERVICES  Per UR Regulation:  Reviewed for med. necessity/level of care/duration of stay  If discussed at Long Length of Stay Meetings, dates discussed:    Comments:  10-22-11 Lorenda Ishihara RN CM 1200 DME ordered, arranged with [facility].  10/20/11 1740 Leonie Green [phone] Cm spoke with patient with adult daughter Lona Millard at bedside. Pt declined SNF. Pt to discharge home with [facility] services to daughter's residence at [address] [place], [place] [address] for 1st week upon discharge. Per pt choice [facility] to provide [facility]. [facility] notified of dc plan. MD orders, demographics, H/P faxed to [facility] at [phone]. No Dme requested. No other needs specified at this time. Patient 7 adult daughter agree with dc plan.

## 2011-10-20 LAB — CBC WITH DIFFERENTIAL/PLATELET
Basophils Absolute: 0 10*3/uL (ref 0.0–0.1)
Basophils Relative: 0 % (ref 0–1)
Eosinophils Absolute: 0.3 10*3/uL (ref 0.0–0.7)
Eosinophils Relative: 3 % (ref 0–5)
HCT: 28.3 % — ABNORMAL LOW (ref 36.0–46.0)
Lymphocytes Relative: 11 % — ABNORMAL LOW (ref 12–46)
MCH: 28.6 pg (ref 26.0–34.0)
MCHC: 33.2 g/dL (ref 30.0–36.0)
MCV: 86 fL (ref 78.0–100.0)
Monocytes Absolute: 0.5 10*3/uL (ref 0.1–1.0)
RDW: 14 % (ref 11.5–15.5)

## 2011-10-20 LAB — BASIC METABOLIC PANEL
CO2: 28 mEq/L (ref 19–32)
Calcium: 8.2 mg/dL — ABNORMAL LOW (ref 8.4–10.5)
Creatinine, Ser: 0.71 mg/dL (ref 0.50–1.10)
GFR calc non Af Amer: 75 mL/min — ABNORMAL LOW (ref >90)

## 2011-10-20 MED ORDER — VITAMIN C 500 MG PO TABS
1000.0000 mg | ORAL_TABLET | Freq: Every day | ORAL | Status: AC
  Administered 2011-10-20: 1000 mg via ORAL
  Filled 2011-10-20: qty 2

## 2011-10-20 MED ORDER — VITAMIN C 500 MG PO TABS
500.0000 mg | ORAL_TABLET | Freq: Two times a day (BID) | ORAL | Status: DC
  Administered 2011-10-21 – 2011-10-22 (×2): 500 mg via ORAL
  Filled 2011-10-20 (×3): qty 1

## 2011-10-20 NOTE — Progress Notes (Signed)
Patient ID: Desiree [name], female   DOB: 1920-07-28, 78 y.o.   MRN: [id number]  General Surgery - [facility] - Progress Note  POD# 2  Subjective: Patient alert and responsive.  Family at bedside.  Tolerating full liquid diet without nausea or emesis.  Small BM last PM.  Pain well-controlled.  Objective: Vital signs in last 24 hours: Temp:  [98.1 F (36.7 C)-98.6 F (37 C)] 98.6 F (37 C) (08/10 0532) Pulse Rate:  [82-88] 88  (08/10 0532) Resp:  [16-18] 16  (08/10 0532) BP: (103-114)/(56-70) 107/70 mmHg (08/10 0938) SpO2:  [92 %-96 %] 96 % (08/10 0532) Last BM Date: 10/20/11  Intake/Output from previous day: 08/09 0701 - 08/10 0700 In: 1712.1 [P.O.:480; I.V.:1232.1] Out: 2645 [Urine:2505; Drains:140]  Exam: HEENT - clear, not icteric Neck - soft Chest - clear bilaterally Cor - RRR, no murmur Abd - soft, mild distension; BS present; dressings dry and intact; JP with thin serosanguinous Ext - no significant edema Neuro - grossly intact, no focal deficits  Lab Results:   [facility] 10/20/11 0520 10/19/11 0515  WBC 11.8* 14.9*  HGB 9.4* 9.2*  HCT 28.3* 27.4*  PLT 211 203     Basename 10/20/11 0520 10/19/11 0515  NA 138 135  K 3.1* 4.0  CL 102 100  CO2 28 29  GLUCOSE 92 108*  BUN 7 9  CREATININE 0.71 0.74  CALCIUM 8.2* 8.5    Studies/Results: No results found.  Assessment / Plan: 1.  Status low anterior resection and rectopexy  - remain on full liquid diet today  - OOB to chair, ambulate with assist  - reviewed with family at bedside.  Velora Heckler, MD, [facility] Office: [phone]  10/20/2011

## 2011-10-20 NOTE — Progress Notes (Signed)
Cm spoke with patient with adult daughter Lona Millard at bedside. Pt declined SNF. Pt to discharge home with [facility] services to daughter's residence at [address] [place], [place] [address] for 1st week upon discharge. Per pt choice [facility] to provide [facility]. [facility] notified of dc plan. MD orders, demographics, H/P faxed to [facility] at [phone]. No Dme requested. No other needs specified at this time. Patient 7 adult daughter agree with dc plan.   Leonie Green [phone]

## 2011-10-20 NOTE — Progress Notes (Signed)
Subjective: Dispo plans reviewed - home with family. Patient sitting up in a chair, bright and alert. Denies pain. Has had BM and ate oatmeal for breakfast  Objective: Lab: Lab Results  Component Value Date   WBC 11.8* 10/20/2011   HGB 9.4* 10/20/2011   HCT 28.3* 10/20/2011   MCV 86.0 10/20/2011   PLT 211 10/20/2011   BMET    Component Value Date/Time   NA 138 10/20/2011 0520   K 3.1* 10/20/2011 0520   CL 102 10/20/2011 0520   CO2 28 10/20/2011 0520   GLUCOSE 92 10/20/2011 0520   BUN 7 10/20/2011 0520   CREATININE 0.71 10/20/2011 0520   CALCIUM 8.2* 10/20/2011 0520   GFRNONAA 75* 10/20/2011 0520   GFRAA 87* 10/20/2011 0520   CBG (last 3)   Basename 10/18/11 0614  GLUCAP 107*      Imaging: no imaging  Scheduled Meds:   . amLODipine  10 mg Oral Daily  . antiseptic oral rinse  15 mL Mouth Rinse BID  . aspirin  81 mg Oral QHS  . brimonidine  2 drop Right Eye BID  . calcium carbonate  2 tablet Oral Daily  . heparin  5,000 Units Subcutaneous Q8H  . levothyroxine  100 mcg Oral QAC breakfast  . lip balm  1 application Topical BID  . loratadine  10 mg Oral Daily  . multivitamin with minerals  1 tablet Oral Daily  . naproxen  250 mg Oral BID WC  . pantoprazole  80 mg Oral Q1200  . psyllium  1 packet Oral BID  . saccharomyces boulardii  250 mg Oral BID  . vitamin C  1,000 mg Oral Daily  . vitamin C  500 mg Oral BID  . vitamin E  1,000 Units Oral Daily  . DISCONTD: alvimopan  12 mg Oral BID  . DISCONTD: vitamin C  500 mg Oral BID   Continuous Infusions:   . lactated ringers 50 mL/hr at 10/19/11 2201   PRN Meds:.acetaminophen, acetaminophen, alum & mag hydroxide-simeth, diphenhydrAMINE, HYDROmorphone (DILAUDID) injection, lactated ringers, magic mouthwash, metoprolol tartrate, ondansetron (ZOFRAN) IV, ondansetron, promethazine   Physical Exam: Filed Vitals:   10/20/11 0938  BP: 107/70  Pulse:   Temp:   Resp:     Intake/Output Summary (Last 24 hours) at 10/20/11  1121 Last data filed at 10/20/11 1610  Gross per 24 hour  Intake 1472.09 ml  Output   2570 ml  Net -1097.91 ml   Wt Readings from Last 3 Encounters:  10/18/11 123 lb (55.792 kg)  10/18/11 123 lb (55.792 kg)  10/10/11 123 lb (55.792 kg)   Gen'l- spry older woman in no distress Cor- 2+ radial pulse, RRR III/VI murmur PUlm - normal respirations, no rales Abd- BS+, soft, not tender to palpation Neuro - A&O, likes Dr's bowtie.      Assessment/Plan: 1. GS - POD#2 after correction prolapse. Starting to take diet. Has had BM  2. DM - last CBG 107. She is starting to eat Plan Continue SS  3. HTN- still a little soft. Losartan on hold  4. Anemia - Hgb stable. No need for any intervention  5. Leukocytosis - coming down. No evidence of infection.  6. Dispo - plan is for home with [facility] ( f-f done)   Illene Regulus [place] [name] (o) [phone]; (c) [phone]  10/20/2011, 11:20 AM

## 2011-10-20 NOTE — Progress Notes (Signed)
Pharmacy Brief Note - Alvimopan (Entereg)  The standing order set for alvimopan (Entereg) now includes an automatic order to discontinue the drug after the patient has had a bowel movement.  The change was approved by the Pharmacy & Therapeutics Committee and the Medical Executive Committee.    This patient has had a bowel movement documented by nursing.  Therefore, alvimopan has been discontinued.  If there are questions, please contact the pharmacy at [phone].  Thank you  Geoffry Paradise, PharmD.   Pager:  [phone] 7:30 AM

## 2011-10-20 NOTE — Progress Notes (Signed)
Occupational Therapy Treatment Patient Details Name: Desiree [name] MRN: [id number] DOB: 10/02/1920 Today's Date: 10/20/2011 Time: 4098-1191 OT Time Calculation (min): 27 min  OT Assessment / Plan / Recommendation Comments on Treatment Session Pt tolerated session well. Pt planning home with daugther.     Follow Up Recommendations  Home health OT;Supervision/Assistance - 24 hour    Barriers to Discharge       Equipment Recommendations  Rolling walker with 5" wheels;3 in 1 bedside comode    Recommendations for Other Services    Frequency Min 2X/week   Plan Discharge plan remains appropriate    Precautions / Restrictions Precautions Precautions: Fall Restrictions Weight Bearing Restrictions: No        ADL  Toilet Transfer: Performed;Minimal assistance Toilet Transfer Method: Stand pivot Toilet Transfer Equipment: Bedside commode Toileting - Clothing Manipulation and Hygiene: Performed;Minimal assistance Where Assessed - Toileting Clothing Manipulation and Hygiene: Sit to stand from 3-in-1 or toilet ADL Comments: Plan was to transfer into bathroom but pt urgently needing to urinate. She states she forgot she doesnt have the catheter in anymore. Brought up BSC to bed due to urgent need to urinate. Pt transferred to [facility] without assistive device and then used IV pole to step around to chair. Pt stood for about 1 minute to wash periareas and dry. Pt very motivated.     OT Diagnosis:    OT Problem List:   OT Treatment Interventions:     OT Goals ADL Goals ADL Goal: Toilet Transfer - Progress: Progressing toward goals ADL Goal: Toileting - Clothing Manipulation - Progress: Progressing toward goals ADL Goal: Toileting - Hygiene - Progress: Progressing toward goals  Visit Information  Last OT Received On: 10/20/11 Assistance Needed: +1    Subjective Data  Subjective: I need to urinate Patient Stated Goal: to be independent again   Prior Functioning       Cognition  Overall Cognitive Status: Appears within functional limits for tasks assessed/performed Arousal/Alertness: Awake/alert Orientation Level: Appears intact for tasks assessed Behavior During Session: [name] for tasks performed    Mobility Bed Mobility Bed Mobility: Rolling Left;Left Sidelying to Sit Rolling Left: 4: Min assist Left Sidelying to Sit: 4: Min assist;With rails;HOB elevated Details for Bed Mobility Assistance: cues for technique and slight assist to bring hips around to EOB.  Transfers Transfers: Sit to Stand;Stand to Sit Sit to Stand: 4: Min assist;From bed;From chair/3-in-1;With upper extremity assist Stand to Sit: 4: Min assist;To chair/3-in-1;With upper extremity assist Details for Transfer Assistance: min verbal cues for hand placement   Exercises    Balance Balance Balance Assessed: Yes Dynamic Standing Balance Dynamic Standing - Level of Assistance: 4: Min assist  End of Session OT - End of Session Activity Tolerance: Patient tolerated treatment well Patient left: in chair;with call bell/phone within reach  GO     Lennox Laity [phone] 10/20/2011, 11:36 AM

## 2011-10-21 LAB — HEMOGLOBIN: Hemoglobin: 9.9 g/dL — ABNORMAL LOW (ref 12.0–15.0)

## 2011-10-21 MED ORDER — BRIMONIDINE TARTRATE 0.15 % OP SOLN
1.0000 [drp] | Freq: Two times a day (BID) | OPHTHALMIC | Status: DC
  Administered 2011-10-21 – 2011-10-22 (×2): 1 [drp] via OPHTHALMIC

## 2011-10-21 NOTE — Progress Notes (Signed)
Subjective: Sitting up in a chair. Feels good. She is bright and perky  Objective: Lab: Lab Results  Component Value Date   WBC 11.8* 10/20/2011   HGB 9.9* 10/21/2011   HCT 28.3* 10/20/2011   MCV 86.0 10/20/2011   PLT 211 10/20/2011   BMET    Component Value Date/Time   NA 138 10/20/2011 0520   K 3.1* 10/20/2011 0520   CL 102 10/20/2011 0520   CO2 28 10/20/2011 0520   GLUCOSE 92 10/20/2011 0520   BUN 7 10/20/2011 0520   CREATININE 0.71 10/20/2011 0520   CALCIUM 8.2* 10/20/2011 0520   GFRNONAA 75* 10/20/2011 0520   GFRAA 87* 10/20/2011 0520     Imaging: no new imaging  Scheduled Meds:   . amLODipine  10 mg Oral Daily  . antiseptic oral rinse  15 mL Mouth Rinse BID  . aspirin  81 mg Oral QHS  . brimonidine  2 drop Right Eye BID  . calcium carbonate  2 tablet Oral Daily  . heparin  5,000 Units Subcutaneous Q8H  . levothyroxine  100 mcg Oral QAC breakfast  . lip balm  1 application Topical BID  . loratadine  10 mg Oral Daily  . multivitamin with minerals  1 tablet Oral Daily  . naproxen  250 mg Oral BID WC  . pantoprazole  80 mg Oral Q1200  . psyllium  1 packet Oral BID  . saccharomyces boulardii  250 mg Oral BID  . vitamin C  1,000 mg Oral Daily  . vitamin C  500 mg Oral BID  . vitamin E  1,000 Units Oral Daily  . DISCONTD: vitamin C  500 mg Oral BID   Continuous Infusions:   . lactated ringers 50 mL/hr at 10/20/11 1650   PRN Meds:.acetaminophen, acetaminophen, alum & mag hydroxide-simeth, diphenhydrAMINE, HYDROmorphone (DILAUDID) injection, lactated ringers, magic mouthwash, metoprolol tartrate, ondansetron (ZOFRAN) IV, ondansetron, promethazine   Physical Exam: Filed Vitals:   10/21/11 0600  BP: 122/70  Pulse: 90  Temp: 98.2 F (36.8 C)  Resp: 16    Intake/Output Summary (Last 24 hours) at 10/21/11 0914 Last data filed at 10/21/11 1610  Gross per 24 hour  Intake 1565.83 ml  Output   1915 ml  Net -349.17 ml    Gen'l- healthy looking woman for her age.  Bright and alert HEENT- C&S clear Cor- RRR III/VI systolic murmur PUlm - normal respirations, No rales or wheezes Abd - soft, BS+     Assessment/Plan: 1. GS POD #3 - doing well. Ready to advance her diet - defer to GS for orders  2. DM - serum glucose 92.  3. HTN - BP coming up.  Plan Continue to hold losartan  4. Anemia - No change in Hgb  5. Leukocytosis - no new lab. No fever or other signs of infection  6. Dispo - no change, family still plans on taking her home.        Casimiro Needle [place] [name] ([phone]; ([phone]  10/21/2011, 9:14 AM

## 2011-10-21 NOTE — Progress Notes (Signed)
Patient ID: Desiree [name], female   DOB: 1920-10-26, 78 y.o.   MRN: [id number]  General Surgery - [facility] - Progress Note  POD# 3  Subjective: Patient up in chair.  Ambulated in halls.  Family at bedside.  No complaints.  Tolerating full liquid diet.  Objective: Vital signs in last 24 hours: Temp:  [97.9 F (36.6 C)-98.6 F (37 C)] 98.2 F (36.8 C) (08/11 0600) Pulse Rate:  [83-90] 90  (08/11 0600) Resp:  [16-18] 16  (08/11 0600) BP: (107-136)/(52-70) 136/70 mmHg (08/11 0926) SpO2:  [96 %-99 %] 96 % (08/11 0600) Last BM Date: 10/20/11  Intake/Output from previous day: 08/10 0701 - 08/11 0700 In: 1565.8 [P.O.:360; I.V.:1205.8] Out: 1915 [Urine:1750; Drains:165]  Exam: HEENT - clear, not icteric Neck - soft Chest - clear bilaterally Cor - RRR, no murmur Abd - slight distension; BS present; flatus this AM; drain with thin yellow output Ext - no significant edema Neuro - grossly intact, no focal deficits  Lab Results:   Basename 10/21/11 0435 10/20/11 0520 10/19/11 0515  WBC -- 11.8* 14.9*  HGB 9.9* 9.4* --  HCT -- 28.3* 27.4*  PLT -- 211 203     Basename 10/20/11 0520 10/19/11 0515  NA 138 135  K 3.1* 4.0  CL 102 100  CO2 28 29  GLUCOSE 92 108*  BUN 7 9  CREATININE 0.71 0.74  CALCIUM 8.2* 8.5    Studies/Results: No results found.  Assessment / Plan: 1.  Status LAR and rectopexy  - advance to regular diet  - ambulate  - leave drain for now  - Dr. Michaell Cowing to assess in AM 8/12  Velora Heckler, MD, [facility] Office: [phone]  10/21/2011

## 2011-10-22 ENCOUNTER — Telehealth: Payer: Self-pay | Admitting: Internal Medicine

## 2011-10-22 NOTE — Telephone Encounter (Signed)
Called pt, spoke with Daughter.  She states she was in the middle of something, and stated she would call tomorrow am to schedule follow up.  Will follow up if we don't here from her.  Thanks!

## 2011-10-22 NOTE — Discharge Summary (Signed)
Physician Discharge Summary  Patient ID: Desiree [name] MRN: [id number] DOB/AGE: Sep 18, 1920 78 y.o.  Admit date: 10/18/2011 Discharge date: 10/22/2011  Patient Care Team: Jacques Navy, MD as PCP - General  Admission Diagnoses: Principal Problem:  *Rectal procidentia / prolapse Active Problems:  DIABETES MELLITUS, TYPE II  DEMENTIA  HYPERTENSION  GERD  URINARY INCONTINENCE  Colonoscopy refused  Discharge Diagnoses:  Principal Problem:  *Rectal procidentia / prolapse Active Problems:  DIABETES MELLITUS, TYPE II  DEMENTIA  HYPERTENSION  GERD  URINARY INCONTINENCE  Colonoscopy refused   Discharged Condition: good  Hospital Course: Patient underwent resection of her redundant rectosigmoid colon and rectopexy.  She was placed on anti-ileus protocol.  She began to have flatus.  She advanced her diet.  By the time of discharge she was tolerating a solid diet, walking well the hallways, cleared by physical and occupational therapy for home health.  Her drainage output tapered off.  Her pain was well-controlled.  She was followed by her primary care physician, Dr. Debby Bud.  He felt she was making progress as well.  Based on these improvements I thought it be safe for her to be discharged out of the hospital on POD#4.  The plan is for the patient stay with her daughter.  I discussed the discharge plans with the patient and her son in the room.  They expressed understanding and appreciation.  We will follow her closely postoperatively  Consults: Internal medicine (Dr. Debby Bud)  Significant Diagnostic Studies:   Treatments: surgery: Lap LAR with rectopexy  Discharge Exam: Blood pressure 120/52, pulse 85, temperature 97.7 F (36.5 C), temperature source Oral, resp. rate 16, height 5\' 3"  (1.6 m), weight 123 lb (55.792 kg), SpO2 95.00%.  General: Pt awake/alert/oriented x4 in no major acute distress Eyes: PERRL, normal EOM. Sclera nonicteric Neuro: CN II-XII intact w/o focal  sensory/motor deficits. Lymph: No head/neck/groin lymphadenopathy Psych:  No delerium/psychosis/paranoia HENT: Normocephalic, Mucus membranes moist.  No thrush.  HOH unchanged Neck: Supple, No tracheal deviation Chest: No pain.  Good respiratory excursion. CV:  Pulses intact.  Regular rhythm Abdomen: Soft, Nondistended.  Min tender at closed incisions.  No incarcerated hernias.  Drain serosanguinous Rectal: No prolapse.  No incontinence. Ext:  SCDs BLE.  No significant edema.  No cyanosis Skin: No petechiae / purpurae   Disposition:   Discharge Orders    Future Orders Please Complete By Expires   Diet - low sodium heart healthy      Increase activity slowly        Medication List  As of 10/22/2011  6:59 AM   TAKE these medications         amLODipine 10 MG tablet   Commonly known as: NORVASC   Take 10 mg by mouth daily with breakfast.      aspirin 81 MG tablet   Take 81 mg by mouth at bedtime.      brimonidine 0.15 % ophthalmic solution   Commonly known as: ALPHAGAN   Place 2 drops into the right eye 2 (two) times daily.      fexofenadine 180 MG tablet   Commonly known as: ALLEGRA   Take 180 mg by mouth at bedtime.      hydrochlorothiazide 50 MG tablet   Commonly known as: HYDRODIURIL   Take 50 mg by mouth daily with breakfast.      levothyroxine 100 MCG tablet   Commonly known as: SYNTHROID, LEVOTHROID   Take 100 mcg by mouth daily before breakfast.  loratadine-pseudoephedrine 10-240 MG per 24 hr tablet   Commonly known as: CLARITIN-D 24-hour   Take 1 tablet by mouth as needed.      losartan 100 MG tablet   Commonly known as: COZAAR   Take 100 mg by mouth daily with breakfast.      multivitamin per tablet   Take 1 tablet by mouth daily.      naproxen sodium 220 MG tablet   Commonly known as: ANAPROX   Take 220 mg by mouth daily as needed. For pain      omeprazole 40 MG capsule   Commonly known as: PRILOSEC   Take 40 mg by mouth as needed.      OVER  THE COUNTER MEDICATION   Take by mouth 2 (two) times daily. Easy fiber granules. Twice a day. ([employer] Brand)  Like metamucil      oxyCODONE 5 MG immediate release tablet   Commonly known as: Oxy IR/ROXICODONE   Take 0.5-1 tablets (2.5-5 mg total) by mouth every 6 (six) hours as needed for pain.      potassium chloride 10 MEQ tablet   Commonly known as: K-DUR   Take 1 tablet (10 mEq total) by mouth 3 (three) times daily.      TUMS 500 MG chewable tablet   Generic drug: calcium carbonate   Chew 2 tablets by mouth daily.      vitamin C 1000 MG tablet   Take 1,000 mg by mouth daily.      vitamin E 1000 UNIT capsule   Generic drug: vitamin E   Take 1,000 Units by mouth daily.             Signed: Jex Strausbaugh C. 10/22/2011, 6:50 AM

## 2011-10-22 NOTE — Progress Notes (Signed)
Physical Therapy Treatment Patient Details Name: Desiree [name] MRN: [id number] DOB: May 29, 1920 Today's Date: 10/22/2011 Time: 4098-1191 PT Time Calculation (min): 17 min  PT Assessment / Plan / Recommendation Comments on Treatment Session  Progressing well with mobility. Discharging home today. Recommend HHPT.    Follow Up Recommendations  Home health PT    Barriers to Discharge        Equipment Recommendations  Rolling walker with 5" wheels;3 in 1 bedside comode    Recommendations for Other Services    Frequency Min 3X/week   Plan Discharge plan remains appropriate    Precautions / Restrictions Precautions Precautions: Fall Restrictions Weight Bearing Restrictions: No   Pertinent Vitals/Pain     Mobility  Bed Mobility Bed Mobility: Not assessed Transfers Transfers: Sit to Stand;Stand to Sit Sit to Stand: 5: Supervision;With upper extremity assist;From chair/3-in-1;With armrests Stand to Sit: 5: Supervision;With upper extremity assist;To chair/3-in-1;With armrests Details for Transfer Assistance: VCs safety, hand placement.  Ambulation/Gait Ambulation/Gait Assistance: 5: Supervision Ambulation Distance (Feet): 120 Feet Ambulation/Gait Assistance Details: Good gait speed. Steady. VCs safety.  Gait Pattern: Step-through pattern;Trunk flexed    Exercises     PT Diagnosis:    PT Problem List:   PT Treatment Interventions:     PT Goals Acute Rehab PT Goals Pt will go Sit to Stand: with supervision PT Goal: Sit to Stand - Progress: Met Pt will Ambulate: 51 - 150 feet;with least restrictive assistive device PT Goal: Ambulate - Progress: Met  Visit Information  Last PT Received On: 10/22/11 Assistance Needed: +1    Subjective Data  Subjective: "I'm glad I came here" Patient Stated Goal: Home. Get back to working on shoulder strengthening program   Cognition  Overall Cognitive Status: Appears within functional limits for tasks  assessed/performed Arousal/Alertness: Awake/alert Orientation Level: Appears intact for tasks assessed Behavior During Session: [facility] for tasks performed    Balance     End of Session PT - End of Session Activity Tolerance: Patient tolerated treatment well Patient left: in chair;with call bell/phone within reach;with family/visitor present   Desiree [name]     Desiree [name] 10/22/2011, 9:10 AM [phone]

## 2011-10-22 NOTE — Telephone Encounter (Signed)
Message copied by Newell Coral on Mon Oct 22, 2011  4:13 PM ------      Message from: Illene Regulus E      Created: Mon Oct 22, 2011  8:21 AM       Leaving hospital this PM. Will need follow up visit in 6 weeks. Thanks

## 2011-10-22 NOTE — Progress Notes (Signed)
Subjective: Feels good and is looking forward to leaving the hospital today.  Objective: Lab: Lab Results  Component Value Date   WBC 11.8* 10/20/2011   HGB 9.9* 10/21/2011   HCT 28.3* 10/20/2011   MCV 86.0 10/20/2011   PLT 211 10/20/2011   BMET    Component Value Date/Time   NA 138 10/20/2011 0520   K 3.1* 10/20/2011 0520   CL 102 10/20/2011 0520   CO2 28 10/20/2011 0520   GLUCOSE 92 10/20/2011 0520   BUN 7 10/20/2011 0520   CREATININE 0.71 10/20/2011 0520   CALCIUM 8.2* 10/20/2011 0520   GFRNONAA 75* 10/20/2011 0520   GFRAA 87* 10/20/2011 0520     Imaging: no new imaging  Scheduled Meds:   . amLODipine  10 mg Oral Daily  . antiseptic oral rinse  15 mL Mouth Rinse BID  . aspirin  81 mg Oral QHS  . brimonidine  1 drop Left Eye BID  . calcium carbonate  2 tablet Oral Daily  . heparin  5,000 Units Subcutaneous Q8H  . levothyroxine  100 mcg Oral QAC breakfast  . lip balm  1 application Topical BID  . loratadine  10 mg Oral Daily  . multivitamin with minerals  1 tablet Oral Daily  . naproxen  250 mg Oral BID WC  . pantoprazole  80 mg Oral Q1200  . psyllium  1 packet Oral BID  . saccharomyces boulardii  250 mg Oral BID  . vitamin C  500 mg Oral BID  . vitamin E  1,000 Units Oral Daily  . DISCONTD: brimonidine  2 drop Right Eye BID   Continuous Infusions:   . lactated ringers 10 mL/hr at 10/21/11 1357   PRN Meds:.acetaminophen, acetaminophen, alum & mag hydroxide-simeth, diphenhydrAMINE, HYDROmorphone (DILAUDID) injection, magic mouthwash, metoprolol tartrate, ondansetron (ZOFRAN) IV, ondansetron, promethazine   Physical Exam: Filed Vitals:   10/22/11 0430  BP: 120/52  Pulse: 85  Temp: 97.7 F (36.5 C)  Resp: 16   gen'l - elderly woman in no distress, sitting in a chair Cor- RRR II/VI murmur Pulm - normal respirations Abd- BS+, soft,      Assessment/Plan: 1. GS - POD #4 - doing well and schedule for home  2. DM - She has been very stable Plan - resume home  regimen - no sugar low carb diet  3. HTN- BP has come up. Plan - she will resume losartan at home and continue other medications  4. Anemia - stable  5. Leukocytosis - no f/u lab but she seems very stable  6. Dispo - going to daughter's for a week or so before returning home. She will be seen in f/u in 6 weeks, sooner as needed.        Casimiro Needle [place] [name] ([phone]; [phone]  10/22/2011, 8:18 AM

## 2011-10-22 NOTE — Progress Notes (Signed)
Occupational Therapy Treatment Patient Details Name: Desiree [name] MRN: [id number] DOB: 1920/12/23 Today's Date: 10/22/2011 Time: 4098-1191 OT Time Calculation (min): 17 min  OT Assessment / Plan / Recommendation Comments on Treatment Session Pt supposed to discharge today. Doing well. Family present and educated on shower transfer safety.    Follow Up Recommendations  Home health OT;Supervision/Assistance - 24 hour    Barriers to Discharge       Equipment Recommendations  Rolling walker with 5" wheels;3 in 1 bedside comode;Other (comment) (only wants 3in1 if covered.)    Recommendations for Other Services    Frequency Min 2X/week   Plan Discharge plan remains appropriate    Precautions / Restrictions Precautions Precautions: Fall Restrictions Weight Bearing Restrictions: No        ADL  Grooming: Performed;Supervision/safety;Other (comment) (verbal cues to bring RW up to the sink for safety) Where Assessed - Grooming: Unsupported standing Toilet Transfer: Performed;Min guard Toilet Transfer Method: Other (comment) (ambulating) Toilet Transfer Equipment: Comfort height toilet;Grab bars Toileting - Clothing Manipulation and Hygiene: Simulated;Min guard Where Assessed - Engineer, mining and Hygiene: Sit to stand from 3-in-1 or toilet Tub/Shower Transfer: Performed;Minimal assistance Tub/Shower Transfer Method: Other (comment) (step back over shower ledge with RW and back out) Equipment Used: Rolling walker ADL Comments: Pt will be going home with daughter. Son present for session. Reviewed shower transfer technique with him and how family needs to assist with steadying RW as she steps in and out. Educated on how 3in1 can be used as a Information systems manager with the handles to help with sit to stand and stand to sit.     OT Diagnosis:    OT Problem List:   OT Treatment Interventions:     OT Goals ADL Goals ADL Goal: Grooming - Progress: Met ADL Goal: Toilet Transfer  - Progress: Progressing toward goals ADL Goal: Toileting - Clothing Manipulation - Progress: Progressing toward goals ADL Goal: Toileting - Hygiene - Progress: Progressing toward goals ADL Goal: Tub/Shower Transfer - Progress: Progressing toward goals  Visit Information  Last OT Received On: 10/22/11 Assistance Needed: +1    Subjective Data  Subjective: I need to stop off at the bathroom first Patient Stated Goal: agreeable to PT/OT. excited/ready to go home   Prior Functioning       Cognition  Overall Cognitive Status: Appears within functional limits for tasks assessed/performed Arousal/Alertness: Awake/alert Orientation Level: Appears intact for tasks assessed Behavior During Session: [facility] for tasks performed    Mobility Bed Mobility Bed Mobility: Not assessed Transfers Transfers: Sit to Stand;Stand to Sit Sit to Stand: 5: Supervision;With upper extremity assist;From chair/3-in-1 Stand to Sit: 5: Supervision;With upper extremity assist;To chair/3-in-1;4: Min guard;Other (comment) (min guard to sit on toilet with grab bar) Details for Transfer Assistance: verbal cues for hand placement and safety/grab bar use.   Exercises    Balance    End of Session OT - End of Session Activity Tolerance: Patient tolerated treatment well Patient left: in chair;with call bell/phone within reach;with family/visitor present  GO     Lennox Laity [phone] 10/22/2011, 10:15 AM

## 2011-10-31 ENCOUNTER — Ambulatory Visit (INDEPENDENT_AMBULATORY_CARE_PROVIDER_SITE_OTHER): Payer: Medicare Other | Admitting: Surgery

## 2011-10-31 ENCOUNTER — Encounter (INDEPENDENT_AMBULATORY_CARE_PROVIDER_SITE_OTHER): Payer: Self-pay | Admitting: Surgery

## 2011-10-31 VITALS — BP 122/60 | HR 82 | Temp 97.8°F | Ht 63.0 in | Wt 126.8 lb

## 2011-10-31 DIAGNOSIS — K623 Rectal prolapse: Secondary | ICD-10-CM

## 2011-10-31 NOTE — Patient Instructions (Signed)

## 2011-10-31 NOTE — Progress Notes (Signed)
Subjective:     Patient ID: Desiree [name], female   DOB: 03/30/1920, 78 y.o.   MRN: [id number]  HPI  [name]  [birth year] [id number]  Patient Care Team: Jacques Navy, MD as PCP - General  This patient is a 78 y.o.female who presents today for surgical evaluation.   Procedure: Laparoscopic low anterior rectosigmoid resection with mesh/suture rectopexy 10/18/2011  The patient comes in today with her daughter.  Feeling well.  No complaints.  Having daily bowel movements.  Energy level good.  Doing exercises with physical therapy.  Feeling stronger.  Eating regularly.  Appetite better.  Patient Active Problem List  Diagnosis  . HYPOTHYROIDISM  . DIABETES MELLITUS, TYPE II  . HYPERLIPIDEMIA  . ANEMIA  . DEMENTIA  . CATARACTS  . HYPERTENSION  . Allergic Rhinitis, Cause Unspecified  . GERD  . OSTEOARTHRITIS  . URINARY INCONTINENCE  . Routine health maintenance  . Rectal procidentia / prolapse  . Colonoscopy refused    Past Medical History  Diagnosis Date  . Dementia   . Diabetes mellitus     type  II- diet controlled  . Hyperlipidemia   . Hypertension   . Osteoarthritis     hands  . Allergic rhinitis   . Anemia   . GERD (gastroesophageal reflux disease)   . Hypothyroidism   . Urinary incontinence     stress  . Blood transfusion   . Osteoporosis   . Rectal prolapse   . Vision loss of right eye   . Glaucoma     bilateral  . Macular degeneration   . Hard of hearing     Past Surgical History  Procedure Date  . Carpal tunnel release     bilateral  . Cataract extraction 2006    left  . Abdominal hysterectomy     nonmalignant reasons  . Vein ligation and stripping     left leg  . Right eye surgery 7-00  . Lumbar fusion 3-01    spinal cord leak- requiring 2 month admission    History   Social History  . Marital Status: Widowed    Spouse Name: N/A    Number of Children: N/A  . Years of Education: N/A   Occupational History  . homemaker     Social History Main Topics  . Smoking status: Former Games developer  . Smokeless tobacco: Never Used  . Alcohol Use: No  . Drug Use: No  . Sexually Active: No   Other Topics Concern  . Not on file   Social History Narrative   married 30 years -divorced; remarried 40 -widowed '83 . 1 daughter 1 son. 1 grandchild, 2 great grandchildren. 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 Terminal care issues: patient clearly states, daughter present, that she would not want heroic or futile care, e.g. CPR, mechanical vent. support, etc.    Family History  Problem Relation Age of Onset  . Other Mother     CVA/ grief  . Heart disease Mother   . Heart attack Father   . Heart disease Father   . Heart attack Brother 47  . Cancer Neg Hx     breast or colon    Current Outpatient Prescriptions  Medication Sig Dispense Refill  . amLODipine (NORVASC) 10 MG tablet Take 10 mg by mouth daily with breakfast.       . Ascorbic Acid (VITAMIN C) 1000 MG tablet Take 1,000 mg by mouth daily.       Marland Kitchen  aspirin 81 MG tablet Take 81 mg by mouth at bedtime.       . brimonidine (ALPHAGAN) 0.15 % ophthalmic solution Place 2 drops into the right eye 2 (two) times daily.       . calcium carbonate (TUMS) 500 MG chewable tablet Chew 2 tablets by mouth daily.       . fexofenadine (ALLEGRA) 180 MG tablet Take 180 mg by mouth at bedtime.       . hydrochlorothiazide (HYDRODIURIL) 50 MG tablet Take 50 mg by mouth daily with breakfast.       . levothyroxine (SYNTHROID, LEVOTHROID) 100 MCG tablet Take 100 mcg by mouth daily before breakfast.      . loratadine-pseudoephedrine (CLARITIN-D 24-HOUR) 10-240 MG per 24 hr tablet Take 1 tablet by mouth as needed.       Marland Kitchen losartan (COZAAR) 100 MG tablet Take 100 mg by mouth daily with breakfast.       . multivitamin (THERAGRAN) per tablet Take 1 tablet by mouth daily.       . naproxen sodium (ANAPROX) 220 MG tablet Take 220 mg by mouth daily as  needed. For pain      . omeprazole (PRILOSEC) 40 MG capsule Take 40 mg by mouth as needed.       Marland Kitchen OVER THE COUNTER MEDICATION Take by mouth 2 (two) times daily. Easy fiber granules. Twice a day. ([facility] Brand)  Like metamucil      . potassium chloride (KLOR-CON 10) 10 MEQ tablet Take 1 tablet (10 mEq total) by mouth 3 (three) times daily.  90 tablet  3  . vitamin E (VITAMIN E) 1000 UNIT capsule Take 1,000 Units by mouth daily.          Allergies  Allergen Reactions  . Other     Bioxon=causes her skin to crawl. LACTOSE INTOLERANT.  Marland Kitchen Rofecoxib     REACTION: GI  . Sulfur Hives    BP 122/60  Pulse 82  Temp 97.8 F (36.6 C) (Temporal)  Ht 5\' 3"  (1.6 m)  Wt 126 lb 12.8 oz (57.516 kg)  BMI 22.46 kg/m2  Dg Chest 2 View  10/10/2011  *RADIOLOGY REPORT*  Clinical Data: Preop  CHEST - 2 VIEW  Comparison: 10/04/2011 and 12/29/2003  Findings: Cardiomediastinal silhouette is stable.  No acute infiltrate or pleural effusion.  No pulmonary edema.  Mild degenerative changes thoracic spine.  Contrast material from recent barium enema noted within colon.  Colonic diverticula are noted. Stable mild degenerative changes thoracic spine.  IMPRESSION: No active disease.  Residual contrast material within colon. Colonic diverticula are noted.  Original Report Authenticated By: Natasha Mead, M.D.   Dg Colon W/cm - Wo/w Kub  10/04/2011  *RADIOLOGY REPORT*  Clinical Data: Evaluate rectal prolapse.  SINGLE COLUMN BARIUM ENEMA  Technique:  Initial scout AP supine abdominal image obtained to insure adequate colon cleansing.  Barium was introduced into the colon in a retrograde fashion and refluxed from the rectum to the cecum. Spot images of the colon followed by overhead radiographs were obtained.  Fluoroscopy time: 3.1 minutes.  Comparison:  None  Findings:  Under gravity there was retrograde opacification of the colon. With the exception of the proximal ascending colon and cecum there was complete opacification of the  entire colon.    Due to patient age and limited mobility the rectal tip was expelled just as air was to be introduced and the exam was changed to a single contrast study.  Extensive diverticular disease involves  the sigmoid colon  No high-grade stricture or mass identified.  Dynamic imaging of the rectum was performed in the left lateral orientation confirming presence of rectal prolapse.  IMPRESSION:  1.  Rectal prolapse. 2.  Sigmoid diverticulosis.  Original Report Authenticated By: Rosealee Albee, M.D.     Review of Systems  Constitutional: Negative for fever, chills and diaphoresis.  HENT: Negative for ear pain, sore throat and trouble swallowing.   Eyes: Negative for photophobia and visual disturbance.  Respiratory: Negative for cough and choking.   Cardiovascular: Negative for chest pain and palpitations.  Gastrointestinal: Negative for nausea, vomiting, abdominal pain, diarrhea, constipation, anal bleeding and rectal pain.  Genitourinary: Negative for dysuria, frequency and difficulty urinating.  Musculoskeletal: Negative for myalgias and gait problem.  Skin: Negative for color change, pallor and rash.  Neurological: Negative for dizziness, speech difficulty, weakness and numbness.  Hematological: Negative for adenopathy.  Psychiatric/Behavioral: Negative for confusion and agitation. The patient is not nervous/anxious.        Objective:   Physical Exam  Constitutional: She is oriented to person, place, and time. She appears well-developed and well-nourished. No distress.  HENT:  Head: Normocephalic.  Mouth/Throat: Oropharynx is clear and moist. No oropharyngeal exudate.  Eyes: Conjunctivae and EOM are normal. Pupils are equal, round, and reactive to light. No scleral icterus.  Neck: Normal range of motion. No tracheal deviation present.  Cardiovascular: Normal rate and intact distal pulses.   Pulmonary/Chest: Effort normal. No respiratory distress. She exhibits no tenderness.    Abdominal: Soft. She exhibits no distension. There is no tenderness. Hernia confirmed negative in the right inguinal area and confirmed negative in the left inguinal area.       Incisions clean with normal healing ridges.  No hernias  Genitourinary: No vaginal discharge found.       Exam done with assistance of female Medical Assistant in the room.  Perianal skin clean with good hygiene.  No pruritis.  No external skin tags / hemorrhoids of significance.  No pilonidal disease.  No fissure.  No abscess/fistula.   Normal sphincter tone.  No prolapse w Valsalva    Musculoskeletal: Normal range of motion. She exhibits no tenderness.  Lymphadenopathy:       Right: No inguinal adenopathy present.       Left: No inguinal adenopathy present.  Neurological: She is alert and oriented to person, place, and time. No cranial nerve deficit. She exhibits normal muscle tone. Coordination (.scgrec) normal.  Skin: Skin is warm and dry. No rash noted. She is not diaphoretic.  Psychiatric: She has a normal mood and affect. Her behavior is normal.       Assessment:     Recovering well POD #13 from lap LAR/rectopexy for rectal prolapse    Plan:     Increase activity as tolerated.  Do not push through pain.  Advanced on diet as tolerated. Bowel regimen to avoid problems.  Return to clinic 3 weeks. The patient & her daughter expressed understanding and appreciation

## 2011-11-16 DIAGNOSIS — Z0279 Encounter for issue of other medical certificate: Secondary | ICD-10-CM

## 2011-11-21 ENCOUNTER — Telehealth (INDEPENDENT_AMBULATORY_CARE_PROVIDER_SITE_OTHER): Payer: Self-pay | Admitting: General Surgery

## 2011-11-21 NOTE — Telephone Encounter (Signed)
Pt's daughter calling to report mother has had diarrhea x 1 week, some small amount of Lt side pain and low-grade fever.  She is 5 weeks out from surgery.  Reassured daughter and discussed viral GI illness in the area.  Advised her to push po fluids AMAP, especially Gatorade, gingerale, or Sprite.  Frequents starchy nibbles (crackers, pasta, rice, potatoes) and broth.  Can use Immodium.  If diarrhea worsens, contact PCP.  Daughter understands and will comply.

## 2011-11-26 ENCOUNTER — Encounter (INDEPENDENT_AMBULATORY_CARE_PROVIDER_SITE_OTHER): Payer: Medicare Other | Admitting: Surgery

## 2011-12-04 ENCOUNTER — Ambulatory Visit (INDEPENDENT_AMBULATORY_CARE_PROVIDER_SITE_OTHER): Payer: Medicare Other | Admitting: Surgery

## 2011-12-04 VITALS — BP 116/70 | HR 82 | Temp 97.6°F | Resp 16 | Ht 63.0 in | Wt 121.6 lb

## 2011-12-04 DIAGNOSIS — Z5320 Procedure and treatment not carried out because of patient's decision for unspecified reasons: Secondary | ICD-10-CM

## 2011-12-04 DIAGNOSIS — K623 Rectal prolapse: Secondary | ICD-10-CM

## 2011-12-04 NOTE — Patient Instructions (Addendum)

## 2011-12-04 NOTE — Progress Notes (Signed)
Subjective:     Patient ID: Desiree [name], female   DOB: [birth year], 78 y.o.   MRN: [id number]  HPI   Desiree [name]  Aug 21, 1920 [id number]  Patient Care Team: Jacques Navy, MD as PCP - General  This patient is a 78 y.o.female who presents today for surgical evaluation.   Procedure: Laparoscopic low anterior rectosigmoid resection with mesh/suture rectopexy 10/18/2011  The patient comes in today with her daughter.  Feeling well.   Having daily bowel movements.  A few episodes of crampy postparandial pain & loose stools.  Backed off from fiber TID & is better.  Energy level good.  Doing exercises with physical therapy.  Feeling stronger.  Eating regularly.  Appetite better.  Patient Active Problem List  Diagnosis  . HYPOTHYROIDISM  . DIABETES MELLITUS, TYPE II  . HYPERLIPIDEMIA  . ANEMIA  . DEMENTIA  . CATARACTS  . HYPERTENSION  . Allergic Rhinitis, Cause Unspecified  . GERD  . OSTEOARTHRITIS  . URINARY INCONTINENCE  . Routine health maintenance  . Rectal procidentia / prolapse  . Colonoscopy refused    Past Medical History  Diagnosis Date  . Dementia   . Diabetes mellitus     type  II- diet controlled  . Hyperlipidemia   . Hypertension   . Osteoarthritis     hands  . Allergic rhinitis   . Anemia   . GERD (gastroesophageal reflux disease)   . Hypothyroidism   . Urinary incontinence     stress  . Blood transfusion   . Osteoporosis   . Rectal prolapse   . Vision loss of right eye   . Glaucoma     bilateral  . Macular degeneration   . Hard of hearing     Past Surgical History  Procedure Date  . Carpal tunnel release     bilateral  . Cataract extraction 2006    left  . Abdominal hysterectomy     nonmalignant reasons  . Vein ligation and stripping     left leg  . Right eye surgery 7-00  . Lumbar fusion 3-01    spinal cord leak- requiring 2 month admission    History   Social History  . Marital Status: Widowed    Spouse Name: N/A    Number  of Children: N/A  . Years of Education: N/A   Occupational History  . homemaker    Social History Main Topics  . Smoking status: Former Games developer  . Smokeless tobacco: Never Used  . Alcohol Use: No  . Drug Use: No  . Sexually Active: No   Other Topics Concern  . Not on file   Social History Narrative   married 30 years -divorced; remarried 60 -widowed '83 . 1 daughter 1 son. 1 grandchild, 2 great grandchildren. 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 Terminal care issues: patient clearly states, daughter present, that she would not want heroic or futile care, e.g. CPR, mechanical vent. support, etc.    Family History  Problem Relation Age of Onset  . Other Mother     CVA/ grief  . Heart disease Mother   . Heart attack Father   . Heart disease Father   . Heart attack Brother 47  . Cancer Neg Hx     breast or colon    Current Outpatient Prescriptions  Medication Sig Dispense Refill  . amLODipine (NORVASC) 10 MG tablet Take 10 mg by mouth daily with breakfast.       .  Ascorbic Acid (VITAMIN C) 1000 MG tablet Take 1,000 mg by mouth daily.       Marland Kitchen aspirin 81 MG tablet Take 81 mg by mouth at bedtime.       . brimonidine (ALPHAGAN) 0.15 % ophthalmic solution Place 2 drops into the right eye 2 (two) times daily.       . calcium carbonate (TUMS) 500 MG chewable tablet Chew 2 tablets by mouth daily.       . fexofenadine (ALLEGRA) 180 MG tablet Take 180 mg by mouth at bedtime.       . hydrochlorothiazide (HYDRODIURIL) 50 MG tablet Take 50 mg by mouth daily with breakfast.       . levothyroxine (SYNTHROID, LEVOTHROID) 100 MCG tablet Take 100 mcg by mouth daily before breakfast.      . loratadine-pseudoephedrine (CLARITIN-D 24-HOUR) 10-240 MG per 24 hr tablet Take 1 tablet by mouth as needed.       Marland Kitchen losartan (COZAAR) 100 MG tablet Take 100 mg by mouth daily with breakfast.       . multivitamin (THERAGRAN) per tablet Take 1 tablet by mouth  daily.       . naproxen sodium (ANAPROX) 220 MG tablet Take 220 mg by mouth daily as needed. For pain      . omeprazole (PRILOSEC) 40 MG capsule Take 40 mg by mouth as needed.       Marland Kitchen OVER THE COUNTER MEDICATION Take by mouth 2 (two) times daily. Easy fiber granules. Twice a day. ([facility] Brand)  Like metamucil      . potassium chloride (KLOR-CON 10) 10 MEQ tablet Take 1 tablet (10 mEq total) by mouth 3 (three) times daily.  90 tablet  3  . vitamin E (VITAMIN E) 1000 UNIT capsule Take 1,000 Units by mouth daily.       . mupirocin ointment (BACTROBAN) 2 %       . oxyCODONE (OXY IR/ROXICODONE) 5 MG immediate release tablet          Allergies  Allergen Reactions  . Other     Bioxon=causes her skin to crawl. LACTOSE INTOLERANT.  Marland Kitchen Rofecoxib     REACTION: GI  . Sulfur Hives    BP 116/70  Pulse 82  Temp 97.6 F (36.4 C) (Temporal)  Resp 16  Ht 5\' 3"  (1.6 m)  Wt 121 lb 9.6 oz (55.157 kg)  BMI 21.54 kg/m2  Dg Chest 2 View  10/10/2011  *RADIOLOGY REPORT*  Clinical Data: Preop  CHEST - 2 VIEW  Comparison: 10/04/2011 and 12/29/2003  Findings: Cardiomediastinal silhouette is stable.  No acute infiltrate or pleural effusion.  No pulmonary edema.  Mild degenerative changes thoracic spine.  Contrast material from recent barium enema noted within colon.  Colonic diverticula are noted. Stable mild degenerative changes thoracic spine.  IMPRESSION: No active disease.  Residual contrast material within colon. Colonic diverticula are noted.  Original Report Authenticated By: Natasha Mead, M.D.   Dg Colon W/cm - Wo/w Kub  10/04/2011  *RADIOLOGY REPORT*  Clinical Data: Evaluate rectal prolapse.  SINGLE COLUMN BARIUM ENEMA  Technique:  Initial scout AP supine abdominal image obtained to insure adequate colon cleansing.  Barium was introduced into the colon in a retrograde fashion and refluxed from the rectum to the cecum. Spot images of the colon followed by overhead radiographs were obtained.  Fluoroscopy time:  3.1 minutes.  Comparison:  None  Findings:  Under gravity there was retrograde opacification of the colon. With the exception of the  proximal ascending colon and cecum there was complete opacification of the entire colon.    Due to patient age and limited mobility the rectal tip was expelled just as air was to be introduced and the exam was changed to a single contrast study.  Extensive diverticular disease involves the sigmoid colon  No high-grade stricture or mass identified.  Dynamic imaging of the rectum was performed in the left lateral orientation confirming presence of rectal prolapse.  IMPRESSION:  1.  Rectal prolapse. 2.  Sigmoid diverticulosis.  Original Report Authenticated By: Rosealee Albee, M.D.     Review of Systems  Constitutional: Negative for fever, chills and diaphoresis.  HENT: Negative for ear pain, sore throat and trouble swallowing.   Eyes: Negative for photophobia and visual disturbance.  Respiratory: Negative for cough and choking.   Cardiovascular: Negative for chest pain and palpitations.  Gastrointestinal: Negative for nausea, vomiting, diarrhea, constipation, blood in stool, abdominal distention, anal bleeding and rectal pain.  Genitourinary: Negative for dysuria, frequency and difficulty urinating.  Musculoskeletal: Negative for myalgias and gait problem.  Skin: Negative for color change, pallor and rash.  Neurological: Negative for dizziness, speech difficulty, weakness and numbness.  Hematological: Negative for adenopathy.  Psychiatric/Behavioral: Negative for confusion and agitation. The patient is not nervous/anxious.        Objective:   Physical Exam  Constitutional: She is oriented to person, place, and time. She appears well-developed and well-nourished. No distress.  HENT:  Head: Normocephalic.  Mouth/Throat: Oropharynx is clear and moist. No oropharyngeal exudate.  Eyes: Conjunctivae normal and EOM are normal. Pupils are equal, round, and reactive to  light. No scleral icterus.  Neck: Normal range of motion. No tracheal deviation present.  Cardiovascular: Normal rate and intact distal pulses.   Pulmonary/Chest: Effort normal. No respiratory distress. She exhibits no tenderness.  Abdominal: Soft. She exhibits no distension. There is no tenderness. Hernia confirmed negative in the right inguinal area and confirmed negative in the left inguinal area.       Incisions clean with normal healing ridges.  No hernias  Genitourinary: No vaginal discharge found.       Exam done with assistance of female Medical Assistant in the room.  Perianal skin clean with fair hygiene.  No pruritis.  No external skin tags / hemorrhoids of significance.  No pilonidal disease.  No fissure.  No abscess/fistula.   Normal sphincter tone.    No prolapse w Valsalva    Musculoskeletal: Normal range of motion. She exhibits no tenderness.  Lymphadenopathy:       Right: No inguinal adenopathy present.       Left: No inguinal adenopathy present.  Neurological: She is alert and oriented to person, place, and time. No cranial nerve deficit. She exhibits normal muscle tone. Coordination (.scgrec) normal.  Skin: Skin is warm and dry. No rash noted. She is not diaphoretic.  Psychiatric: She has a normal mood and affect. Her behavior is normal.       Assessment:     Recovering,6 weeks s/p lap LAR/rectopexy for rectal prolapse    Plan:     Increase activity as tolerated.  Do not push through pain.  Advanced on diet as tolerated. Bowel regimen to avoid problems.  Agree w less  Return to clinic PRN. The patient & her daughter expressed understanding and appreciation

## 2011-12-10 ENCOUNTER — Encounter: Payer: Self-pay | Admitting: Internal Medicine

## 2011-12-10 ENCOUNTER — Ambulatory Visit (INDEPENDENT_AMBULATORY_CARE_PROVIDER_SITE_OTHER): Payer: Medicare Other | Admitting: Internal Medicine

## 2011-12-10 VITALS — BP 122/64 | HR 93 | Temp 97.3°F | Resp 14 | Wt 120.0 lb

## 2011-12-10 DIAGNOSIS — Z23 Encounter for immunization: Secondary | ICD-10-CM | POA: Insufficient documentation

## 2011-12-10 DIAGNOSIS — K623 Rectal prolapse: Secondary | ICD-10-CM

## 2011-12-10 DIAGNOSIS — I10 Essential (primary) hypertension: Secondary | ICD-10-CM

## 2011-12-10 DIAGNOSIS — E119 Type 2 diabetes mellitus without complications: Secondary | ICD-10-CM

## 2011-12-10 MED ORDER — AMLODIPINE BESYLATE 10 MG PO TABS: 10.0000 mg | ORAL_TABLET | Freq: Every day | ORAL | Status: DC

## 2011-12-10 NOTE — Patient Instructions (Addendum)
You have done GREAT!!! Your exam today is good: clear lungs, normal heart rate, stable murmur, no fluid retention. I have reviewed Dr. Michaell Cowing' note and he is very pleased with how you have healed.  Will administer pneumococcal vaccine today since we don't have a record of immunization.  Return to see me as needed or in 6 months.

## 2011-12-10 NOTE — Progress Notes (Signed)
Subjective:    Patient ID: Desiree [name], female    DOB: 09/25/1920, 78 y.o.   MRN: [id number]  HPI Desiree [name] presents for follow-up after rectal surgery for prolapse. Her hospital course was uneventful. Post discharge she has been doing very well: good appetite, bowel habit not quite normal, no pain. She saw Dr. Michaell Cowing last week - note reviewed - and she has healed nicely. She has no complaints.  Past Medical History  Diagnosis Date  . Dementia   . Diabetes mellitus     type  II- diet controlled  . Hyperlipidemia   . Hypertension   . Osteoarthritis     hands  . Allergic rhinitis   . Anemia   . GERD (gastroesophageal reflux disease)   . Hypothyroidism   . Urinary incontinence     stress  . Blood transfusion   . Osteoporosis   . Rectal prolapse   . Vision loss of right eye   . Glaucoma     bilateral  . Macular degeneration   . Hard of hearing    Past Surgical History  Procedure Date  . Carpal tunnel release     bilateral  . Cataract extraction 2006    left  . Abdominal hysterectomy     nonmalignant reasons  . Vein ligation and stripping     left leg  . Right eye surgery 7-00  . Lumbar fusion 3-01    spinal cord leak- requiring 2 month admission   Family History  Problem Relation Age of Onset  . Other Mother     CVA/ grief  . Heart disease Mother   . Heart attack Father   . Heart disease Father   . Heart attack Brother 47  . Cancer Neg Hx     breast or colon   History   Social History  . Marital Status: Widowed    Spouse Name: N/A    Number of Children: N/A  . Years of Education: N/A   Occupational History  . homemaker    Social History Main Topics  . Smoking status: Former Games developer  . Smokeless tobacco: Never Used  . Alcohol Use: No  . Drug Use: No  . Sexually Active: No   Other Topics Concern  . Not on file   Social History Narrative   married 30 years -divorced; remarried 31 -widowed '83 . 1 daughter 1 son. 1 grandchild, 2 great  grandchildren. 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 Terminal care issues: patient clearly states, daughter present, that she would not want heroic or futile care, e.g. CPR, mechanical vent. support, etc.    Current Outpatient Prescriptions on File Prior to Visit  Medication Sig Dispense Refill  . Ascorbic Acid (VITAMIN C) 1000 MG tablet Take 1,000 mg by mouth daily.       Marland Kitchen aspirin 81 MG tablet Take 81 mg by mouth at bedtime.       . brimonidine (ALPHAGAN) 0.15 % ophthalmic solution Place 2 drops into the right eye 2 (two) times daily.       . calcium carbonate (TUMS) 500 MG chewable tablet Chew 2 tablets by mouth daily.       . fexofenadine (ALLEGRA) 180 MG tablet Take 180 mg by mouth at bedtime.       . hydrochlorothiazide (HYDRODIURIL) 50 MG tablet Take 50 mg by mouth daily with breakfast.       . levothyroxine (SYNTHROID, LEVOTHROID) 100 MCG tablet Take 100  mcg by mouth daily before breakfast.      . loratadine-pseudoephedrine (CLARITIN-D 24-HOUR) 10-240 MG per 24 hr tablet Take 1 tablet by mouth as needed.       Marland Kitchen losartan (COZAAR) 100 MG tablet Take 100 mg by mouth daily with breakfast.       . multivitamin (THERAGRAN) per tablet Take 1 tablet by mouth daily.       . naproxen sodium (ANAPROX) 220 MG tablet Take 220 mg by mouth daily as needed. For pain      . omeprazole (PRILOSEC) 40 MG capsule Take 40 mg by mouth as needed.       Marland Kitchen OVER THE COUNTER MEDICATION Take by mouth 2 (two) times daily. Easy fiber granules. Twice a day. ([facility] Brand)  Like metamucil      . potassium chloride (KLOR-CON 10) 10 MEQ tablet Take 1 tablet (10 mEq total) by mouth 3 (three) times daily.  90 tablet  3  . vitamin E (VITAMIN E) 1000 UNIT capsule Take 1,000 Units by mouth daily.       Marland Kitchen DISCONTD: amLODipine (NORVASC) 10 MG tablet Take 10 mg by mouth daily with breakfast.       . mupirocin ointment (BACTROBAN) 2 %       . oxyCODONE (OXY IR/ROXICODONE) 5 MG  immediate release tablet           Review of Systems System review is negative for any constitutional, cardiac, pulmonary, GI or neuro symptoms or complaints other than as described in the HPI.     Objective:   Physical Exam Filed Vitals:   12/10/11 1410  BP: 122/64  Pulse: 93  Temp: 97.3 F (36.3 C)  Resp: 14   gen'l- WNWD 78 y/o in no distress HEENT- C&S clear Cor- RRR, II/VI systolic murmur best at RSB, but prominent at LSB as well. Pulm - normal respirations Neuro - A&O       Assessment & Plan:

## 2011-12-10 NOTE — Assessment & Plan Note (Signed)
BP Readings from Last 3 Encounters:  12/10/11 122/64  12/04/11 116/70  10/31/11 122/60   Great control

## 2011-12-10 NOTE — Assessment & Plan Note (Signed)
Stable - no episodes hypoglycemia. Due for A1C in October.

## 2011-12-10 NOTE — Assessment & Plan Note (Signed)
S/p repair - Aug '12. Good results

## 2012-01-23 ENCOUNTER — Telehealth: Payer: Self-pay | Admitting: Internal Medicine

## 2012-01-23 MED ORDER — POTASSIUM CHLORIDE ER 10 MEQ PO TBCR: 10.0000 meq | EXTENDED_RELEASE_TABLET | Freq: Three times a day (TID) | ORAL | Status: DC

## 2012-01-23 MED ORDER — OMEPRAZOLE 40 MG PO CPDR: 40.0000 mg | DELAYED_RELEASE_CAPSULE | ORAL | Status: DC | PRN

## 2012-01-23 NOTE — Telephone Encounter (Signed)
Pt's daughter advised.  

## 2012-01-23 NOTE — Telephone Encounter (Signed)
Caller: Dale/Child; Patient Name: Desiree [name]; PCP: Illene Regulus (Adults only); Best Callback Phone Number: [phone] Calling regarding needs refills sent to [facility] for Klor-Con 10 MEQ takes 1 tablet TID, needs 270 for 90 day supply and omeprazole 40 mg 1 tablet daily #90, faxed to [phone], please call daughter/Dale when taken care of,pt has 2 week supply of both medications.

## 2012-04-03 ENCOUNTER — Other Ambulatory Visit: Payer: Self-pay | Admitting: *Deleted

## 2012-04-03 MED ORDER — HYDROCHLOROTHIAZIDE 50 MG PO TABS: 50.0000 mg | ORAL_TABLET | Freq: Every day | ORAL | Status: DC

## 2012-04-03 MED ORDER — LOSARTAN POTASSIUM 100 MG PO TABS: 100.0000 mg | ORAL_TABLET | Freq: Every day | ORAL | Status: DC

## 2012-04-03 MED ORDER — BRIMONIDINE TARTRATE 0.15 % OP SOLN: 2.0000 [drp] | Freq: Two times a day (BID) | OPHTHALMIC | Status: DC

## 2012-04-03 MED ORDER — AMLODIPINE BESYLATE 10 MG PO TABS: 10.0000 mg | ORAL_TABLET | Freq: Every day | ORAL | Status: DC

## 2012-04-03 MED ORDER — LEVOTHYROXINE SODIUM 100 MCG PO TABS: 100.0000 ug | ORAL_TABLET | Freq: Every day | ORAL | Status: DC

## 2012-04-03 MED ORDER — POTASSIUM CHLORIDE ER 10 MEQ PO TBCR: 10.0000 meq | EXTENDED_RELEASE_TABLET | Freq: Three times a day (TID) | ORAL | Status: DC

## 2012-04-03 MED ORDER — OMEPRAZOLE 40 MG PO CPDR: 40.0000 mg | DELAYED_RELEASE_CAPSULE | ORAL | Status: DC | PRN

## 2012-04-03 NOTE — Telephone Encounter (Signed)
Pt changing mail order pharmacies; new rx written for her.

## 2012-04-09 ENCOUNTER — Telehealth: Payer: Self-pay | Admitting: *Deleted

## 2012-04-09 NOTE — Telephone Encounter (Signed)
Pt's daughter is calling regarding fax sent by [facility] regarding drug interaction between HCTZ and generic Alphagan. She would like callback when fax is sent because [facility] will not refill medications until fax is received and pt will be out of medication soon.

## 2012-04-09 NOTE — Telephone Encounter (Signed)
No meaningful interaction between diuretics and alphagan. Will complete form when available. OK for refills

## 2012-04-26 ENCOUNTER — Other Ambulatory Visit: Payer: Self-pay

## 2012-08-14 ENCOUNTER — Ambulatory Visit (INDEPENDENT_AMBULATORY_CARE_PROVIDER_SITE_OTHER): Payer: Medicare Other | Admitting: Internal Medicine

## 2012-08-14 ENCOUNTER — Encounter: Payer: Self-pay | Admitting: Internal Medicine

## 2012-08-14 VITALS — BP 118/56 | HR 92 | Temp 98.1°F | Wt 126.8 lb

## 2012-08-14 DIAGNOSIS — J069 Acute upper respiratory infection, unspecified: Secondary | ICD-10-CM

## 2012-08-14 MED ORDER — BENZONATATE 100 MG PO CAPS: 100.0000 mg | ORAL_CAPSULE | Freq: Two times a day (BID) | ORAL | Status: DC | PRN

## 2012-08-14 NOTE — Patient Instructions (Addendum)
Viral upper respiratory infection - no evidence of a bacterial infection, thus no need for antibiotics.  Plan Tessalon perles - three times a day for the scratchy cough - tickle in the throat  Robitussin DM (or the generic equivalent) which contains quafenesis (active ingredient in mucinex) and dextromethorophan - an expectorant.  Tyleno 500 mg three times a day for aches, low grade fever  Hydrate  Herbal tea, e.g. Spearmint, with a tablespoon of honey and a teaspoon of lemon juice to sooth the throat  May also use a gargle of choice.

## 2012-08-14 NOTE — Progress Notes (Signed)
Subjective:    Patient ID: Desiree [name], female    DOB: 10-02-1920, 79 y.o.   MRN: [id number]  HPI Desiree [name] presents with a 10-14 day h/o cold, sore throat and cough. Cough is productive of phlegm. She has not had any fever. NO SOB. No nausea or diarrhea. No sinus pressure. Good appetite. Mild odynophagia.   Past Medical History  Diagnosis Date  . Dementia   . Diabetes mellitus     type  II- diet controlled  . Hyperlipidemia   . Hypertension   . Osteoarthritis     hands  . Allergic rhinitis   . Anemia   . GERD (gastroesophageal reflux disease)   . Hypothyroidism   . Urinary incontinence     stress  . Blood transfusion   . Osteoporosis   . Rectal prolapse   . Vision loss of right eye   . Glaucoma     bilateral  . Macular degeneration   . Hard of hearing    Past Surgical History  Procedure Laterality Date  . Carpal tunnel release      bilateral  . Cataract extraction  2006    left  . Abdominal hysterectomy      nonmalignant reasons  . Vein ligation and stripping      left leg  . Right eye surgery  7-00  . Lumbar fusion  3-01    spinal cord leak- requiring 2 month admission   Family History  Problem Relation Age of Onset  . Other Mother     CVA/ grief  . Heart disease Mother   . Heart attack Father   . Heart disease Father   . Heart attack Brother 47  . Cancer Neg Hx     breast or colon   History   Social History  . Marital Status: Widowed    Spouse Name: N/A    Number of Children: N/A  . Years of Education: N/A   Occupational History  . homemaker    Social History Main Topics  . Smoking status: Former Games developer  . Smokeless tobacco: Never Used  . Alcohol Use: No  . Drug Use: No  . Sexually Active: No   Other Topics Concern  . Not on file   Social History Narrative   married 30 years -divorced; remarried 45 -widowed '83 . 1 daughter 1 son. 1 grandchild, 2 great grandchildren. worked in Hospital doctor, retired. lives alone -  IADLs except driving.    Terminal care issues: patient clearly states, daughter present, that she would not want heroic or futile care, e.g. CPR, mechanical vent. support, etc.    Current Outpatient Prescriptions on File Prior to Visit  Medication Sig Dispense Refill  . amLODipine (NORVASC) 10 MG tablet Take 1 tablet (10 mg total) by mouth daily with breakfast.  90 tablet  1  . Ascorbic Acid (VITAMIN C) 1000 MG tablet Take 1,000 mg by mouth daily.       Marland Kitchen aspirin 81 MG tablet Take 81 mg by mouth at bedtime.       . brimonidine (ALPHAGAN) 0.15 % ophthalmic solution Place 2 drops into the right eye 2 (two) times daily.  15 mL  1  . calcium carbonate (TUMS) 500 MG chewable tablet Chew 2 tablets by mouth daily.       . fexofenadine (ALLEGRA) 180 MG tablet Take 180 mg by mouth at bedtime.       . hydrochlorothiazide (HYDRODIURIL) 50 MG tablet Take 1 tablet (  50 mg total) by mouth daily with breakfast.  90 tablet  1  . levothyroxine (SYNTHROID, LEVOTHROID) 100 MCG tablet Take 1 tablet (100 mcg total) by mouth daily before breakfast.  90 tablet  1  . loratadine-pseudoephedrine (CLARITIN-D 24-HOUR) 10-240 MG per 24 hr tablet Take 1 tablet by mouth as needed.       Marland Kitchen losartan (COZAAR) 100 MG tablet Take 1 tablet (100 mg total) by mouth daily with breakfast.  90 tablet  1  . multivitamin (THERAGRAN) per tablet Take 1 tablet by mouth daily.       . mupirocin ointment (BACTROBAN) 2 %       . naproxen sodium (ANAPROX) 220 MG tablet Take 220 mg by mouth daily as needed. For pain      . omeprazole (PRILOSEC) 40 MG capsule Take 1 capsule (40 mg total) by mouth as needed.  90 capsule  1  . OVER THE COUNTER MEDICATION Take by mouth 2 (two) times daily. Easy fiber granules. Twice a day. ([facility] Brand)  Like metamucil      . oxyCODONE (OXY IR/ROXICODONE) 5 MG immediate release tablet       . potassium chloride (KLOR-CON 10) 10 MEQ tablet Take 1 tablet (10 mEq total) by mouth 3 (three) times daily.  270 tablet  1  .  vitamin E (VITAMIN E) 1000 UNIT capsule Take 1,000 Units by mouth daily.        No current facility-administered medications on file prior to visit.      Review of Systems System review is negative for any constitutional, cardiac, pulmonary, GI or neuro symptoms or complaints other than as described in the HPI.     Objective:   Physical Exam Filed Vitals:   08/14/12 1126  BP: 118/56  Pulse: 92  Temp: 98.1 F (36.7 C)   Wt Readings from Last 3 Encounters:  08/14/12 126 lb 12.8 oz (57.516 kg)  12/10/11 120 lb (54.432 kg)  12/04/11 121 lb 9.6 oz (55.157 kg)   BP Readings from Last 3 Encounters:  08/14/12 118/56  12/10/11 122/64  12/04/11 116/70   Gen'l - elderly, spry white woman in no distress HEENT- C&S clear, no sinus tenderness. Throat clear w/o erythema or exudate Cor- 2+ radial, RRR Chest - prominent kyphosis Lungs - CTAP Nodes - negative Neuro - A&O x 3       Assessment & Plan:  Viral upper respiratory infection - no evidence of a bacterial infection, thus no need for antibiotics.  Plan Tessalon perles - three times a day for the scratchy cough - tickle in the throat  Robitussin DM (or the generic equivalent) which contains quafenesis (active ingredient in mucinex) and dextromethorophan - an expectorant.  Tyleno 500 mg three times a day for aches, low grade fever  Hydrate  Herbal tea, e.g. Spearmint, with a tablespoon of honey and a teaspoon of lemon juice to sooth the throat  May also use a gargle of choice.

## 2012-09-19 ENCOUNTER — Other Ambulatory Visit: Payer: Self-pay | Admitting: *Deleted

## 2012-09-19 MED ORDER — HYDROCHLOROTHIAZIDE 50 MG PO TABS: 50.0000 mg | ORAL_TABLET | Freq: Every day | ORAL | Status: DC

## 2012-09-19 MED ORDER — BRIMONIDINE TARTRATE 0.15 % OP SOLN: 2.0000 [drp] | Freq: Two times a day (BID) | OPHTHALMIC | Status: DC

## 2012-09-19 MED ORDER — LOSARTAN POTASSIUM 100 MG PO TABS: 100.0000 mg | ORAL_TABLET | Freq: Every day | ORAL | Status: DC

## 2012-09-19 MED ORDER — AMLODIPINE BESYLATE 10 MG PO TABS: 10.0000 mg | ORAL_TABLET | Freq: Every day | ORAL | Status: DC

## 2012-09-19 MED ORDER — POTASSIUM CHLORIDE ER 10 MEQ PO TBCR: 10.0000 meq | EXTENDED_RELEASE_TABLET | Freq: Three times a day (TID) | ORAL | Status: DC

## 2012-09-19 MED ORDER — LEVOTHYROXINE SODIUM 100 MCG PO TABS: 100.0000 ug | ORAL_TABLET | Freq: Every day | ORAL | Status: DC

## 2012-09-19 MED ORDER — OMEPRAZOLE 40 MG PO CPDR: 40.0000 mg | DELAYED_RELEASE_CAPSULE | ORAL | Status: DC | PRN

## 2012-09-19 NOTE — Telephone Encounter (Signed)
pts daughter called requesting refills of medications.  Rx sent to [facility].

## 2012-09-19 NOTE — Telephone Encounter (Signed)
Opened in error, medications have already been sent to [facility].

## 2013-02-18 ENCOUNTER — Ambulatory Visit (INDEPENDENT_AMBULATORY_CARE_PROVIDER_SITE_OTHER): Payer: Medicare Other | Admitting: Internal Medicine

## 2013-02-18 ENCOUNTER — Encounter: Payer: Self-pay | Admitting: Internal Medicine

## 2013-02-18 ENCOUNTER — Other Ambulatory Visit (INDEPENDENT_AMBULATORY_CARE_PROVIDER_SITE_OTHER): Payer: Medicare Other

## 2013-02-18 VITALS — BP 132/60 | HR 78 | Temp 97.1°F | Wt 128.0 lb

## 2013-02-18 DIAGNOSIS — E119 Type 2 diabetes mellitus without complications: Secondary | ICD-10-CM

## 2013-02-18 DIAGNOSIS — Z23 Encounter for immunization: Secondary | ICD-10-CM

## 2013-02-18 DIAGNOSIS — D649 Anemia, unspecified: Secondary | ICD-10-CM

## 2013-02-18 DIAGNOSIS — E039 Hypothyroidism, unspecified: Secondary | ICD-10-CM

## 2013-02-18 DIAGNOSIS — I10 Essential (primary) hypertension: Secondary | ICD-10-CM

## 2013-02-18 DIAGNOSIS — Z0000 Encounter for general adult medical examination without abnormal findings: Secondary | ICD-10-CM

## 2013-02-18 DIAGNOSIS — K219 Gastro-esophageal reflux disease without esophagitis: Secondary | ICD-10-CM

## 2013-02-18 DIAGNOSIS — E785 Hyperlipidemia, unspecified: Secondary | ICD-10-CM

## 2013-02-18 DIAGNOSIS — J309 Allergic rhinitis, unspecified: Secondary | ICD-10-CM

## 2013-02-18 LAB — COMPREHENSIVE METABOLIC PANEL
ALT: 18 U/L (ref 0–35)
AST: 21 U/L (ref 0–37)
Albumin: 3.9 g/dL (ref 3.5–5.2)
Alkaline Phosphatase: 108 U/L (ref 39–117)
Calcium: 9 mg/dL (ref 8.4–10.5)
Chloride: 109 mEq/L (ref 96–112)
Creatinine, Ser: 0.9 mg/dL (ref 0.4–1.2)
Potassium: 4.5 mEq/L (ref 3.5–5.1)

## 2013-02-18 LAB — HEPATIC FUNCTION PANEL
ALT: 18 U/L (ref 0–35)
Albumin: 3.9 g/dL (ref 3.5–5.2)
Alkaline Phosphatase: 108 U/L (ref 39–117)
Total Protein: 7.2 g/dL (ref 6.0–8.3)

## 2013-02-18 LAB — HEMOGLOBIN AND HEMATOCRIT, BLOOD
HCT: 30.4 % — ABNORMAL LOW (ref 36.0–46.0)
Hemoglobin: 10.1 g/dL — ABNORMAL LOW (ref 12.0–15.0)

## 2013-02-18 LAB — LIPID PANEL
Cholesterol: 125 mg/dL (ref 0–200)
HDL: 40.2 mg/dL (ref >39.00)
Triglycerides: 42 mg/dL (ref 0.0–149.0)

## 2013-02-18 LAB — TSH: TSH: 4.42 u[IU]/mL (ref 0.35–5.50)

## 2013-02-18 MED ORDER — FLUTICASONE PROPIONATE 50 MCG/ACT NA SUSP: 1.0000 | Freq: Every day | NASAL | Status: DC

## 2013-02-18 MED ORDER — POTASSIUM CHLORIDE ER 10 MEQ PO TBCR: 10.0000 meq | EXTENDED_RELEASE_TABLET | Freq: Three times a day (TID) | ORAL | Status: DC

## 2013-02-18 MED ORDER — OMEPRAZOLE 40 MG PO CPDR: 40.0000 mg | DELAYED_RELEASE_CAPSULE | ORAL | Status: DC | PRN

## 2013-02-18 MED ORDER — LEVOTHYROXINE SODIUM 100 MCG PO TABS: 100.0000 ug | ORAL_TABLET | Freq: Every day | ORAL | Status: DC

## 2013-02-18 MED ORDER — HYDROCHLOROTHIAZIDE 50 MG PO TABS: 50.0000 mg | ORAL_TABLET | Freq: Every day | ORAL | Status: DC

## 2013-02-18 MED ORDER — LOSARTAN POTASSIUM 100 MG PO TABS: 100.0000 mg | ORAL_TABLET | Freq: Every day | ORAL | Status: DC

## 2013-02-18 MED ORDER — BRIMONIDINE TARTRATE 0.15 % OP SOLN: 2.0000 [drp] | Freq: Two times a day (BID) | OPHTHALMIC | Status: DC

## 2013-02-18 MED ORDER — AMLODIPINE BESYLATE 10 MG PO TABS: 10.0000 mg | ORAL_TABLET | Freq: Every day | ORAL | Status: DC

## 2013-02-18 NOTE — Patient Instructions (Addendum)
Thank you for coming to see me.  You are doing well all things considered.  You do have a heart murmur which may be the pulmonic valve. You do have a bruits (murmur) in the right carotid artery vs. Radiation of the heart murmur to the neck. At this time it is not of value to get either a 2 D echo of the heart or an ultrasound of the carotids. The treatment in any case will be aspirin every day.  The swelling in you right ankle is a problem with venous circulation (see drawing) and the treatment is elevation of the leg or support hose.  Will check lab work today and the results will be posted, with comments, to MyChart in 48 hrs.  Your tetanus is expired but you aren't in the barn or garden much anymore. You had pneumovax in 2013 and will get Prevnar - a companion pneumonia vaccine-today. In a month you should get the shingles vaccine at the drug store (Rx provided).  For congestion - use Flonase 1 spray per nostril per day. This should replace the claritin.  You are in pretty good condition for the condition you are in.  Have a wonderful Holiday.

## 2013-02-18 NOTE — Assessment & Plan Note (Signed)
Plan is for lipid panel today with judicious recommendations to follow.

## 2013-02-18 NOTE — Assessment & Plan Note (Signed)
Stable and asymptomatic  Plan TSH with recommendations to follow.

## 2013-02-18 NOTE — Progress Notes (Signed)
Subjective:    Patient ID: Desiree [name], female    DOB: 1920/05/22, 79 y.o.   MRN: [id number]  HPI The patient is here for annual Medicare wellness examination and management of other chronic and acute problems.  Upper respiratory drainage with horseness of the voice. No fever or chills. Is taking multiple antihistamines but still has drainage problems.  She c/o swelling right ankle. Does get a little better over night. Not painful   The risk factors are reflected in the social history.  The roster of all physicians providing medical care to patient - is listed in the Snapshot section of the chart.  Activities of daily living:  The patient is 100% inedpendent in all ADLs: dressing, toileting, feeding as well as independent mobility-walks with a cane. She does not drive.   Home safety : The patient has smoke detectors in the home. They wear seatbelts. No firearms at home . There is no violence in the home.   There is no risks for hepatitis, STDs or HIV. There is no history of blood transfusion. They have no travel history to infectious disease endemic areas of the world.  The patient has  seen their dentist in the last six month. They have seen their eye doctor in the last year. They admit to hearing difficulty and have not had audiologic testing in the last year.    They do not  have excessive sun exposure. Discussed the need for sun protection: hats, long sleeves and use of sunscreen if there is significant sun exposure.   Diet: the importance of a healthy diet is discussed. They do have a healthy diet.  The patient has a regular exercise program - walks on a treadmill for 10-15 min.  The benefits of regular aerobic exercise were discussed.  Depression screen: there are no signs or vegative symptoms of depression- irritability, change in appetite, anhedonia, sadness/tearfullness.  Cognitive assessment: the patient manages all their financial and personal affairs and is actively  engaged.   The following portions of the patient's history were reviewed and updated as appropriate: allergies, current medications, past family history, past medical history,  past surgical history, past social history  and problem list.  Vision, hearing, body mass index were assessed and reviewed.   During the course of the visit the patient was educated and counseled about appropriate screening and preventive services including : fall prevention , diabetes screening, nutrition counseling, colorectal cancer screening, and recommended immunizations.  Past Medical History  Diagnosis Date  . Dementia   . Diabetes mellitus     type  II- diet controlled  . Hyperlipidemia   . Hypertension   . Osteoarthritis     hands  . Allergic rhinitis   . Anemia   . GERD (gastroesophageal reflux disease)   . Hypothyroidism   . Urinary incontinence     stress  . Blood transfusion   . Osteoporosis   . Rectal prolapse   . Vision loss of right eye   . Glaucoma     bilateral  . Macular degeneration   . Hard of hearing    Past Surgical History  Procedure Laterality Date  . Carpal tunnel release      bilateral  . Cataract extraction  2006    left  . Abdominal hysterectomy      nonmalignant reasons  . Vein ligation and stripping      left leg  . Right eye surgery  7-00  . Lumbar fusion  3-01  spinal cord leak- requiring 2 month admission   Family History  Problem Relation Age of Onset  . Other Mother     CVA/ grief  . Heart disease Mother   . Heart attack Father   . Heart disease Father   . Heart attack Brother 47  . Cancer Neg Hx     breast or colon   History   Social History  . Marital Status: Widowed    Spouse Name: N/A    Number of Children: N/A  . Years of Education: N/A   Occupational History  . homemaker    Social History Main Topics  . Smoking status: Former Games developer  . Smokeless tobacco: Never Used  . Alcohol Use: No  . Drug Use: No  . Sexual Activity: No    Other Topics Concern  . Not on file   Social History Narrative   married 30 years -divorced; remarried 73 -widowed '83 . 1 daughter 1 son. 1 grandchild, 2 great grandchildren. 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    Terminal care issues: patient clearly states, daughter present, that she would not want heroic or futile care, e.g. CPR, mechanical vent. support, etc. DNR/DNI, no HEROICs    Current Outpatient Prescriptions on File Prior to Visit  Medication Sig Dispense Refill  . amLODipine (NORVASC) 10 MG tablet Take 1 tablet (10 mg total) by mouth daily with breakfast.  90 tablet  1  . Ascorbic Acid (VITAMIN C) 1000 MG tablet Take 1,000 mg by mouth daily.       Marland Kitchen aspirin 81 MG tablet Take 81 mg by mouth at bedtime.       . brimonidine (ALPHAGAN) 0.15 % ophthalmic solution Place 2 drops into the right eye 2 (two) times daily.  15 mL  1  . calcium carbonate (TUMS) 500 MG chewable tablet Chew 2 tablets by mouth daily.       . fexofenadine (ALLEGRA) 180 MG tablet Take 180 mg by mouth at bedtime.       . hydrochlorothiazide (HYDRODIURIL) 50 MG tablet Take 1 tablet (50 mg total) by mouth daily with breakfast.  90 tablet  1  . levothyroxine (SYNTHROID, LEVOTHROID) 100 MCG tablet Take 1 tablet (100 mcg total) by mouth daily before breakfast.  90 tablet  1  . loratadine-pseudoephedrine (CLARITIN-D 24-HOUR) 10-240 MG per 24 hr tablet Take 1 tablet by mouth as needed.       Marland Kitchen losartan (COZAAR) 100 MG tablet Take 1 tablet (100 mg total) by mouth daily with breakfast.  90 tablet  1  . multivitamin (THERAGRAN) per tablet Take 1 tablet by mouth daily.       Marland Kitchen omeprazole (PRILOSEC) 40 MG capsule Take 1 capsule (40 mg total) by mouth as needed.  90 capsule  1  . potassium chloride (KLOR-CON 10) 10 MEQ tablet Take 1 tablet (10 mEq total) by mouth 3 (three) times daily.  270 tablet  1  . vitamin E (VITAMIN E) 1000 UNIT capsule Take 1,000 Units by mouth daily.        . benzonatate (TESSALON) 100 MG capsule Take 1 capsule (100 mg total) by mouth 2 (two) times daily as needed for cough.  20 capsule  0  . mupirocin ointment (BACTROBAN) 2 %       . naproxen sodium (ANAPROX) 220 MG tablet Take 220 mg by mouth daily as needed. For pain      . OVER THE COUNTER MEDICATION Take  by mouth 2 (two) times daily. Easy fiber granules. Twice a day. ([facility] Brand)  Like metamucil       No current facility-administered medications on file prior to visit.     Review of Systems  Constitutional: Negative.   HENT: Positive for congestion and hearing loss.        Post nasal drainage  Eyes: Negative.   Respiratory: Negative.   Cardiovascular: Negative.   Gastrointestinal: Negative.   Genitourinary: Negative.   Musculoskeletal: Negative.   Skin: Negative.   Neurological: Negative.   Psychiatric/Behavioral: Negative.    Review of Systems  Constitutional: Negative.   HENT: Positive for congestion and hearing loss.        Post nasal drainage  Eyes: Negative.   Respiratory: Negative.   Cardiovascular: Negative.   Gastrointestinal: Negative.   Genitourinary: Negative.   Musculoskeletal: Negative.   Skin: Negative.   Neurological: Negative.   Endo/Heme/Allergies: Negative.   Psychiatric/Behavioral: Negative.        Objective:   Physical Exam Filed Vitals:   02/18/13 0905  BP: 132/60  Pulse: 78  Temp: 97.1 F (36.2 C)   Wt Readings from Last 3 Encounters:  02/18/13 128 lb (58.06 kg)  08/14/12 126 lb 12.8 oz (57.516 kg)  12/10/11 120 lb (54.432 kg)   Gen'l: well nourished, well developed, elderly Woman in no distress HEENT - [place]/AT, hearing aids in place, oropharynx with native dentition in good condition, no buccal or palatal lesions, posterior pharynx clear, mucous membranes moist. C&S clear, PERRLA, fundi - normal Neck - supple, no thyromegaly Nodes- negative submental, cervical, supraclavicular regions Chest - mild kyphosis, no CVAT Lungs - clear  without rales, wheezes. No increased work of breathing Breast - Cardiovascular - regular rate and rhythm, quiet precordium, II/VI systolic murmur best at left sternal border, no rubs or gallops, 2+ radial, DP pulses. Carotid bruits right II/VI Abdomen - BS+ x 4, no HSM, no guarding or rebound or tenderness Pelvic - deferred to gyn Rectal - deferred to gyn Extremities - no clubbing, cyanosis,  or deformity. Swelling at the right ankle with pitting Neuro - A&O x 3,  noCN II-XII normal, motor strength normal and equal, DTRs 2+ and symmetrical biceps, radial, and patellar tendons. Cerebellar - no tremor, no rigidity, fluid movement and normal gait. Derm - Head, neck, back, abdomen and extremities without suspicious lesions        Assessment & Plan:

## 2013-02-18 NOTE — Assessment & Plan Note (Signed)
Chronic problem with congestion and post-nasal drip.  Plan Trial of Flonase 1 spray/nares daily

## 2013-02-18 NOTE — Assessment & Plan Note (Signed)
Asymptomatic. She does not monitor CBG at home  Plan Lab- A1C with recommendations to follow.

## 2013-02-18 NOTE — Progress Notes (Signed)
Pre visit review using our clinic review tool, if applicable. No additional management support is needed unless otherwise documented below in the visit note. 

## 2013-02-18 NOTE — Assessment & Plan Note (Signed)
Stable on omeprazole - good control of symptoms

## 2013-02-18 NOTE — Assessment & Plan Note (Signed)
Interval history - doing well. Main c/o ankle swelling right and chronic rhinnitis. Limited physical exam with systolic heart mm and right carotid bruits. She has aged out of colorectal and breast cancer screening. Immunizations current and due for shingles vaccine.   In summary  A very pleasant elderly woman who is doing well.

## 2013-02-18 NOTE — Assessment & Plan Note (Signed)
No overt signs of anemia: no tachycardia, hypotension, pallor.  Plan Lab H/H with recommendations to follow.

## 2013-02-18 NOTE — Assessment & Plan Note (Signed)
BP Readings from Last 3 Encounters:  02/18/13 132/60  08/14/12 118/56  12/10/11 122/64   Good control.  Plan Routine lab  Continue present medications

## 2013-09-14 ENCOUNTER — Ambulatory Visit (INDEPENDENT_AMBULATORY_CARE_PROVIDER_SITE_OTHER)
Admission: RE | Admit: 2013-09-14 | Discharge: 2013-09-14 | Disposition: A | Discharge status: Home / Self Care | Payer: Medicare Other | Source: Ambulatory Visit | Attending: Internal Medicine | Admitting: Internal Medicine

## 2013-09-14 ENCOUNTER — Encounter: Payer: Self-pay | Admitting: Internal Medicine

## 2013-09-14 ENCOUNTER — Ambulatory Visit (INDEPENDENT_AMBULATORY_CARE_PROVIDER_SITE_OTHER): Payer: Medicare Other | Admitting: Internal Medicine

## 2013-09-14 VITALS — BP 140/60 | HR 101 | Temp 98.6°F | Wt 121.0 lb

## 2013-09-14 DIAGNOSIS — G8929 Other chronic pain: Secondary | ICD-10-CM

## 2013-09-14 DIAGNOSIS — M25559 Pain in unspecified hip: Secondary | ICD-10-CM

## 2013-09-14 DIAGNOSIS — M25552 Pain in left hip: Principal | ICD-10-CM

## 2013-09-14 NOTE — Patient Instructions (Signed)
Your next office appointment will be determined based upon review of your pending  x-rays. Those instructions will be transmitted to you through My Chart  OR  by mail.

## 2013-09-14 NOTE — Progress Notes (Signed)
   Subjective:    Patient ID: Desiree [name], female    DOB: May 13, 1920, 80 y.o.   MRN: [id number]  HPI   She's had pain in the left hip since 07/10/13. There was no specific injury or trigger for this.  It now aches constantly up to a level X. It is worse sitting or lying in the left lateral decubitus position.  Muscle relaxants  & oxycodone have been of no benefit.  Oxycodone had been prescribed for her  rectal prolapse surgery by Dr. Michaell CowingGross.  There is no associated redness or swelling in this area. She denies any joint stiffness      Review of Systems   Skin color temperature in this area has not changed  She has no associated fever, chills, sweats, weight change.  There is no numbness, tingling, or weakness in the left lower extremity  She has no loss control of her bladder or bowels.       Objective:   Physical Exam   She appears her stated age; she is incredibly frail.  She has hearing aids and still has difficulty hearing.  She walks bent over at the waist with a short choppy, broad gait. She does use a single pronged cane.  She requires help getting on the exam table.  She has pain with lateral rotation of the left hip and to light percussion.        Assessment & Plan:  #1 L hip pain; R/O fracture.  I reviewed films of the hip which show marked decrease in bone density. I cannot appreciate definite fracture; the films have not been formally read By a radiologist. There is calcium deposit lateral to the greater trochanter .

## 2013-09-14 NOTE — Progress Notes (Signed)
Pre visit review using our clinic review tool, if applicable. No additional management support is needed unless otherwise documented below in the visit note. 

## 2013-09-28 ENCOUNTER — Encounter (HOSPITAL_COMMUNITY): Payer: Self-pay | Admitting: Emergency Medicine

## 2013-09-28 ENCOUNTER — Emergency Department (HOSPITAL_COMMUNITY): Payer: Medicare Other

## 2013-09-28 ENCOUNTER — Inpatient Hospital Stay (HOSPITAL_COMMUNITY)
Admission: EM | Admit: 2013-09-28 | Discharge: 2013-10-01 | DRG: 690 | Disposition: A | Discharge status: Skilled Nursing Facility | Payer: Medicare Other | Attending: Internal Medicine | Admitting: Internal Medicine

## 2013-09-28 DIAGNOSIS — H5460 Unqualified visual loss, one eye, unspecified: Secondary | ICD-10-CM | POA: Diagnosis present

## 2013-09-28 DIAGNOSIS — Z66 Do not resuscitate: Secondary | ICD-10-CM | POA: Diagnosis present

## 2013-09-28 DIAGNOSIS — H269 Unspecified cataract: Secondary | ICD-10-CM

## 2013-09-28 DIAGNOSIS — N179 Acute kidney failure, unspecified: Secondary | ICD-10-CM | POA: Diagnosis present

## 2013-09-28 DIAGNOSIS — M169 Osteoarthritis of hip, unspecified: Secondary | ICD-10-CM | POA: Diagnosis present

## 2013-09-28 DIAGNOSIS — Z981 Arthrodesis status: Secondary | ICD-10-CM | POA: Diagnosis not present

## 2013-09-28 DIAGNOSIS — Z0000 Encounter for general adult medical examination without abnormal findings: Secondary | ICD-10-CM

## 2013-09-28 DIAGNOSIS — Z7982 Long term (current) use of aspirin: Secondary | ICD-10-CM | POA: Diagnosis not present

## 2013-09-28 DIAGNOSIS — Z8249 Family history of ischemic heart disease and other diseases of the circulatory system: Secondary | ICD-10-CM

## 2013-09-28 DIAGNOSIS — R32 Unspecified urinary incontinence: Secondary | ICD-10-CM

## 2013-09-28 DIAGNOSIS — M810 Age-related osteoporosis without current pathological fracture: Secondary | ICD-10-CM | POA: Diagnosis present

## 2013-09-28 DIAGNOSIS — H409 Unspecified glaucoma: Secondary | ICD-10-CM | POA: Diagnosis present

## 2013-09-28 DIAGNOSIS — D649 Anemia, unspecified: Secondary | ICD-10-CM | POA: Diagnosis present

## 2013-09-28 DIAGNOSIS — Z87891 Personal history of nicotine dependence: Secondary | ICD-10-CM | POA: Diagnosis not present

## 2013-09-28 DIAGNOSIS — H9190 Unspecified hearing loss, unspecified ear: Secondary | ICD-10-CM | POA: Diagnosis present

## 2013-09-28 DIAGNOSIS — Z79899 Other long term (current) drug therapy: Secondary | ICD-10-CM

## 2013-09-28 DIAGNOSIS — R627 Adult failure to thrive: Secondary | ICD-10-CM | POA: Diagnosis present

## 2013-09-28 DIAGNOSIS — E860 Dehydration: Secondary | ICD-10-CM | POA: Diagnosis present

## 2013-09-28 DIAGNOSIS — H3530 Unspecified macular degeneration: Secondary | ICD-10-CM | POA: Diagnosis present

## 2013-09-28 DIAGNOSIS — I10 Essential (primary) hypertension: Secondary | ICD-10-CM | POA: Diagnosis present

## 2013-09-28 DIAGNOSIS — F0390 Unspecified dementia without behavioral disturbance: Secondary | ICD-10-CM | POA: Diagnosis present

## 2013-09-28 DIAGNOSIS — E440 Moderate protein-calorie malnutrition: Secondary | ICD-10-CM | POA: Diagnosis present

## 2013-09-28 DIAGNOSIS — J309 Allergic rhinitis, unspecified: Secondary | ICD-10-CM

## 2013-09-28 DIAGNOSIS — E785 Hyperlipidemia, unspecified: Secondary | ICD-10-CM

## 2013-09-28 DIAGNOSIS — K219 Gastro-esophageal reflux disease without esophagitis: Secondary | ICD-10-CM | POA: Diagnosis present

## 2013-09-28 DIAGNOSIS — E039 Hypothyroidism, unspecified: Secondary | ICD-10-CM | POA: Diagnosis present

## 2013-09-28 DIAGNOSIS — M1610 Unilateral primary osteoarthritis, unspecified hip: Secondary | ICD-10-CM | POA: Diagnosis present

## 2013-09-28 DIAGNOSIS — K623 Rectal prolapse: Secondary | ICD-10-CM

## 2013-09-28 DIAGNOSIS — N3000 Acute cystitis without hematuria: Secondary | ICD-10-CM

## 2013-09-28 DIAGNOSIS — M25559 Pain in unspecified hip: Secondary | ICD-10-CM | POA: Diagnosis not present

## 2013-09-28 DIAGNOSIS — E119 Type 2 diabetes mellitus without complications: Secondary | ICD-10-CM | POA: Diagnosis present

## 2013-09-28 DIAGNOSIS — F068 Other specified mental disorders due to known physiological condition: Secondary | ICD-10-CM

## 2013-09-28 DIAGNOSIS — N390 Urinary tract infection, site not specified: Secondary | ICD-10-CM | POA: Diagnosis present

## 2013-09-28 DIAGNOSIS — A498 Other bacterial infections of unspecified site: Secondary | ICD-10-CM | POA: Diagnosis present

## 2013-09-28 DIAGNOSIS — M1990 Unspecified osteoarthritis, unspecified site: Secondary | ICD-10-CM | POA: Diagnosis present

## 2013-09-28 HISTORY — DX: Other specified postprocedural states: R11.2

## 2013-09-28 HISTORY — DX: Other specified postprocedural states: Z98.890

## 2013-09-28 LAB — CBC WITH DIFFERENTIAL/PLATELET
BASOS PCT: 0 % (ref 0–1)
Basophils Absolute: 0 10*3/uL (ref 0.0–0.1)
EOS ABS: 0.3 10*3/uL (ref 0.0–0.7)
Eosinophils Relative: 2 % (ref 0–5)
HCT: 33.9 % — ABNORMAL LOW (ref 36.0–46.0)
HEMOGLOBIN: 11 g/dL — AB (ref 12.0–15.0)
Lymphocytes Relative: 12 % (ref 12–46)
Lymphs Abs: 1.8 10*3/uL (ref 0.7–4.0)
MCH: 26.5 pg (ref 26.0–34.0)
MCHC: 32.4 g/dL (ref 30.0–36.0)
MCV: 81.7 fL (ref 78.0–100.0)
MONO ABS: 0.9 10*3/uL (ref 0.1–1.0)
Monocytes Relative: 6 % (ref 3–12)
NEUTROS PCT: 80 % — AB (ref 43–77)
Neutro Abs: 12.2 10*3/uL — ABNORMAL HIGH (ref 1.7–7.7)
Platelets: 438 10*3/uL — ABNORMAL HIGH (ref 150–400)
RBC: 4.15 MIL/uL (ref 3.87–5.11)
RDW: 15.2 % (ref 11.5–15.5)
WBC: 15.2 10*3/uL — ABNORMAL HIGH (ref 4.0–10.5)

## 2013-09-28 LAB — COMPREHENSIVE METABOLIC PANEL
ALT: 15 U/L (ref 0–35)
ANION GAP: 13 (ref 5–15)
AST: 17 U/L (ref 0–37)
Albumin: 2.7 g/dL — ABNORMAL LOW (ref 3.5–5.2)
Alkaline Phosphatase: 216 U/L — ABNORMAL HIGH (ref 39–117)
BUN: 32 mg/dL — AB (ref 6–23)
CALCIUM: 8.6 mg/dL (ref 8.4–10.5)
CO2: 21 mEq/L (ref 19–32)
CREATININE: 1.14 mg/dL — AB (ref 0.50–1.10)
Chloride: 103 mEq/L (ref 96–112)
GFR calc non Af Amer: 41 mL/min — ABNORMAL LOW (ref >90)
GFR, EST AFRICAN AMERICAN: 48 mL/min — AB (ref >90)
GLUCOSE: 132 mg/dL — AB (ref 70–99)
Potassium: 5.1 mEq/L (ref 3.7–5.3)
SODIUM: 137 meq/L (ref 137–147)
Total Bilirubin: <0.2 mg/dL — ABNORMAL LOW (ref 0.3–1.2)
Total Protein: 6.7 g/dL (ref 6.0–8.3)

## 2013-09-28 LAB — URINALYSIS, ROUTINE W REFLEX MICROSCOPIC
Bilirubin Urine: NEGATIVE
GLUCOSE, UA: NEGATIVE mg/dL
Ketones, ur: NEGATIVE mg/dL
Nitrite: POSITIVE — AB
PH: 5 (ref 5.0–8.0)
Protein, ur: NEGATIVE mg/dL
SPECIFIC GRAVITY, URINE: 1.016 (ref 1.005–1.030)
Urobilinogen, UA: 0.2 mg/dL (ref 0.0–1.0)

## 2013-09-28 LAB — GLUCOSE, CAPILLARY
Glucose-Capillary: 111 mg/dL — ABNORMAL HIGH (ref 70–99)
Glucose-Capillary: 132 mg/dL — ABNORMAL HIGH (ref 70–99)

## 2013-09-28 LAB — URINE MICROSCOPIC-ADD ON

## 2013-09-28 MED ORDER — SODIUM CHLORIDE 0.9 % IV SOLN
INTRAVENOUS | Status: DC
  Administered 2013-09-28 – 2013-09-30 (×4): via INTRAVENOUS

## 2013-09-28 MED ORDER — ACETAMINOPHEN 650 MG RE SUPP: 650.0000 mg | Freq: Four times a day (QID) | RECTAL | Status: DC | PRN

## 2013-09-28 MED ORDER — MULTIVITAMINS PO TABS: 1.0000 | ORAL_TABLET | Freq: Every day | ORAL | Status: DC

## 2013-09-28 MED ORDER — OXYCODONE HCL 5 MG PO TABS: 5.0000 mg | ORAL_TABLET | ORAL | Status: DC | PRN

## 2013-09-28 MED ORDER — AMLODIPINE BESYLATE 10 MG PO TABS
10.0000 mg | ORAL_TABLET | Freq: Every day | ORAL | Status: DC
  Administered 2013-09-29 – 2013-10-01 (×3): 10 mg via ORAL
  Filled 2013-09-28 (×3): qty 1

## 2013-09-28 MED ORDER — HEPARIN SODIUM (PORCINE) 5000 UNIT/ML IJ SOLN
5000.0000 [IU] | Freq: Three times a day (TID) | INTRAMUSCULAR | Status: DC
  Filled 2013-09-28 (×2): qty 1

## 2013-09-28 MED ORDER — ACETAMINOPHEN 325 MG PO TABS: 650.0000 mg | ORAL_TABLET | Freq: Four times a day (QID) | ORAL | Status: DC | PRN

## 2013-09-28 MED ORDER — PANTOPRAZOLE SODIUM 40 MG PO TBEC
40.0000 mg | DELAYED_RELEASE_TABLET | Freq: Every day | ORAL | Status: DC
  Administered 2013-09-28 – 2013-10-01 (×4): 40 mg via ORAL
  Filled 2013-09-28 (×4): qty 1

## 2013-09-28 MED ORDER — SODIUM CHLORIDE 0.9 % IV BOLUS (SEPSIS)
500.0000 mL | Freq: Once | INTRAVENOUS | Status: AC
  Administered 2013-09-28: 500 mL via INTRAVENOUS

## 2013-09-28 MED ORDER — ONDANSETRON HCL 4 MG PO TABS: 4.0000 mg | ORAL_TABLET | Freq: Four times a day (QID) | ORAL | Status: DC | PRN

## 2013-09-28 MED ORDER — POTASSIUM CHLORIDE ER 10 MEQ PO TBCR
10.0000 meq | EXTENDED_RELEASE_TABLET | Freq: Three times a day (TID) | ORAL | Status: DC
  Administered 2013-09-28 – 2013-10-01 (×8): 10 meq via ORAL
  Filled 2013-09-28 (×10): qty 1

## 2013-09-28 MED ORDER — ADULT MULTIVITAMIN W/MINERALS CH
1.0000 | ORAL_TABLET | Freq: Every day | ORAL | Status: DC
  Administered 2013-09-29 – 2013-10-01 (×3): 1 via ORAL
  Filled 2013-09-28 (×3): qty 1

## 2013-09-28 MED ORDER — VITAMIN E 45 MG (100 UNIT) PO CAPS
1000.0000 [IU] | ORAL_CAPSULE | Freq: Every day | ORAL | Status: DC
  Administered 2013-09-30 – 2013-10-01 (×2): 1000 [IU] via ORAL
  Filled 2013-09-28 (×3): qty 2

## 2013-09-28 MED ORDER — CIPROFLOXACIN IN D5W 200 MG/100ML IV SOLN
200.0000 mg | Freq: Two times a day (BID) | INTRAVENOUS | Status: DC
  Administered 2013-09-28 – 2013-10-01 (×6): 200 mg via INTRAVENOUS
  Filled 2013-09-28 (×7): qty 100

## 2013-09-28 MED ORDER — VITAMIN E 45 MG (100 UNIT) PO CAPS: 1000.0000 [IU] | ORAL_CAPSULE | Freq: Every day | ORAL | Status: DC

## 2013-09-28 MED ORDER — FLUTICASONE PROPIONATE 50 MCG/ACT NA SUSP
1.0000 | Freq: Every day | NASAL | Status: DC
  Administered 2013-09-30: 1 via NASAL
  Filled 2013-09-28: qty 16

## 2013-09-28 MED ORDER — LORATADINE 10 MG PO TABS
10.0000 mg | ORAL_TABLET | Freq: Every day | ORAL | Status: DC | PRN
  Filled 2013-09-28: qty 1

## 2013-09-28 MED ORDER — TRAMADOL-ACETAMINOPHEN 37.5-325 MG PO TABS: 1.0000 | ORAL_TABLET | Freq: Four times a day (QID) | ORAL | Status: DC | PRN

## 2013-09-28 MED ORDER — INSULIN ASPART 100 UNIT/ML ~~LOC~~ SOLN
0.0000 [IU] | Freq: Three times a day (TID) | SUBCUTANEOUS | Status: DC
  Administered 2013-09-29: 2 [IU] via SUBCUTANEOUS
  Administered 2013-09-30 – 2013-10-01 (×2): 3 [IU] via SUBCUTANEOUS

## 2013-09-28 MED ORDER — ONDANSETRON HCL 4 MG/2ML IJ SOLN: 4.0000 mg | Freq: Four times a day (QID) | INTRAMUSCULAR | Status: DC | PRN

## 2013-09-28 MED ORDER — LEVOTHYROXINE SODIUM 100 MCG PO TABS
100.0000 ug | ORAL_TABLET | Freq: Every day | ORAL | Status: DC
  Administered 2013-09-29 – 2013-10-01 (×3): 100 ug via ORAL
  Filled 2013-09-28 (×5): qty 1

## 2013-09-28 MED ORDER — ACETAMINOPHEN 325 MG PO TABS
650.0000 mg | ORAL_TABLET | Freq: Four times a day (QID) | ORAL | Status: DC | PRN
  Filled 2013-09-28: qty 2

## 2013-09-28 MED ORDER — SODIUM CHLORIDE 0.9 % IV SOLN
INTRAVENOUS | Status: DC
  Administered 2013-09-28: 15:00:00 via INTRAVENOUS

## 2013-09-28 MED ORDER — CIPROFLOXACIN IN D5W 400 MG/200ML IV SOLN
400.0000 mg | Freq: Once | INTRAVENOUS | Status: AC
  Administered 2013-09-28: 400 mg via INTRAVENOUS
  Filled 2013-09-28: qty 200

## 2013-09-28 MED ORDER — ASPIRIN 81 MG PO TABS: 81.0000 mg | ORAL_TABLET | Freq: Every day | ORAL | Status: DC

## 2013-09-28 MED ORDER — POLYETHYL GLYCOL-PROPYL GLYCOL 0.4-0.3 % OP SOLN: 1.0000 [drp] | Freq: Two times a day (BID) | OPHTHALMIC | Status: DC

## 2013-09-28 MED ORDER — INSULIN ASPART 100 UNIT/ML ~~LOC~~ SOLN
0.0000 [IU] | Freq: Three times a day (TID) | SUBCUTANEOUS | Status: DC
Start: 2013-09-28 — End: 2013-09-28

## 2013-09-28 MED ORDER — VITAMIN C 500 MG PO TABS
1000.0000 mg | ORAL_TABLET | Freq: Every day | ORAL | Status: DC
  Administered 2013-09-29 – 2013-10-01 (×3): 1000 mg via ORAL
  Filled 2013-09-28 (×3): qty 2

## 2013-09-28 MED ORDER — POLYVINYL ALCOHOL 1.4 % OP SOLN
1.0000 [drp] | Freq: Two times a day (BID) | OPHTHALMIC | Status: DC
  Administered 2013-09-28 – 2013-09-30 (×5): 1 [drp] via OPHTHALMIC
  Filled 2013-09-28: qty 15

## 2013-09-28 MED ORDER — ASPIRIN 81 MG PO CHEW
81.0000 mg | CHEWABLE_TABLET | Freq: Every day | ORAL | Status: DC
  Administered 2013-09-28 – 2013-09-30 (×3): 81 mg via ORAL
  Filled 2013-09-28 (×5): qty 1

## 2013-09-28 MED ORDER — OXYCODONE-ACETAMINOPHEN 5-325 MG PO TABS
1.0000 | ORAL_TABLET | Freq: Once | ORAL | Status: AC
  Administered 2013-09-28: 1 via ORAL
  Filled 2013-09-28: qty 1

## 2013-09-28 MED ORDER — HEPARIN SODIUM (PORCINE) 5000 UNIT/ML IJ SOLN
5000.0000 [IU] | Freq: Three times a day (TID) | INTRAMUSCULAR | Status: DC
  Administered 2013-09-28 – 2013-10-01 (×7): 5000 [IU] via SUBCUTANEOUS
  Filled 2013-09-28 (×11): qty 1

## 2013-09-28 MED ORDER — BRIMONIDINE TARTRATE 0.15 % OP SOLN
1.0000 [drp] | Freq: Two times a day (BID) | OPHTHALMIC | Status: DC
  Administered 2013-09-28 – 2013-09-30 (×5): 1 [drp] via OPHTHALMIC
  Filled 2013-09-28: qty 5

## 2013-09-28 MED ORDER — CYCLOBENZAPRINE HCL 10 MG PO TABS: 5.0000 mg | ORAL_TABLET | Freq: Every evening | ORAL | Status: DC | PRN

## 2013-09-28 MED ORDER — CALCIUM CARBONATE ANTACID 500 MG PO CHEW
2.0000 | CHEWABLE_TABLET | Freq: Two times a day (BID) | ORAL | Status: DC
  Administered 2013-09-28 – 2013-10-01 (×6): 400 mg via ORAL
  Filled 2013-09-28 (×6): qty 2
  Filled 2013-09-28: qty 1

## 2013-09-28 NOTE — H&P (Signed)
[facility] History and Physical  Sammuel Cooperretta H Mansouri WUJ:[id number] DOB: 09/25/1920 DOA: 09/28/2013  Referring physician: Dr Silverio LayYAO PCP: Dr Alwyn RenHopper  Chief Complaint:  Decreased po intake, suprapubic pain and dehydration for 1 week   HPI:  80 year old female with history of hypertension, hypothyroidism, mild dementia, osteoarthritis of the hip with descent Cortizone injection by Dr. Bernardo HeaterWaldorf as outpatient was brought to the ED by her daughter with decreased by mouth intake, generalized weakness, poor mobility and complains of suprapubic pain and discomfort for past one week. Patient is very hard of hearing and history mainly provided by the daughter. As per her, patient has very poor appetite and not getting out of bed most of the day for past one week. She has also been complaining of pain over the suprapubic area and she has noticed her urine to be foul-smelling . She was complaining of pain in her left hip from her osteoarthritis for which she was given cortisone injection recently by Dr. Bernardo HeaterWaldorf as did improve her symptoms. She has been taking as needed oxycodone and NSAIDs for the pain. Patient reports to have nausea but no vomiting. She also had 2 episodes of diarrhea last week as she was taking extra stool softner. Patient denies headache, dizziness, fever, chills,vomiting, chest pain, palpitations, SOB,  bowel or urinary symptoms.  Course in the ED Patient had normal vitals. Blood work done showed WBC of 15.2 K., hemoglobin of 11 and they did so for 38. Chemistry showed sodium of 137, K. of 5.1, chloride of 103, bicarbonate of 21, BUN of 32 and creatinine 1.14 with glucose of 132. X-ray of the left hip was unremarkable for acute injury. UA was highly suggestive of UTI and patient given a dose of IV ciprofloxacin and hospitalist consulted for admission to medical floor for acute kidney injury D. with UTI and dehydration.   Review of Systems:  Constitutional: Denies fever, chills,  diaphoresis, appetite change and fatigue.  HEENT: Denies eye pain,  hearing loss, ear pain,, trouble swallowing, neck pain,  Respiratory: Denies SOB, DOE, cough, chest tightness,  and wheezing.   Cardiovascular: Denies chest pain, palpitations and leg swelling.  Gastrointestinal:  nausea, suprapubic pain, denies vomiting, abdominal pain, diarrhea, constipation, blood in stool and abdominal distention.  Genitourinary: Denies dysuria, urgency, frequency, hematuria, flank pain and difficulty urinating. foul smelling urine+ Endocrine: Denies: hot or cold intolerance,, polyuria, polydipsia. Musculoskeletal: Denies myalgias, back pain, joint swelling, arthralgias and gait problem.  Skin: Denies pallor, rash and wound.  Neurological: Denies dizziness, seizures, syncope, weakness, light-headedness, numbness and headaches.  Psychiatric/Behavioral: Denies  confusion, has mild dementia   Past Medical History  Diagnosis Date  . Dementia   . Diabetes mellitus     type  II- diet controlled  . Hyperlipidemia   . Hypertension   . Osteoarthritis     hands  . Allergic rhinitis   . Anemia   . GERD (gastroesophageal reflux disease)   . Hypothyroidism   . Urinary incontinence     stress  . Blood transfusion   . Osteoporosis   . Rectal prolapse   . Vision loss of right eye   . Glaucoma     bilateral  . Macular degeneration   . Hard of hearing   . PONV (postoperative nausea and vomiting)    Past Surgical History  Procedure Laterality Date  . Carpal tunnel release      bilateral  . Cataract extraction  2006    left  . Abdominal hysterectomy  nonmalignant reasons  . Vein ligation and stripping      left leg  . Right eye surgery  7-00  . Lumbar fusion  3-01    spinal cord leak- requiring 2 month admission   Social History:  reports that she quit smoking about 40 years ago. She has never used smokeless tobacco. She reports that she does not drink alcohol or use illicit  drugs.  Allergies  Allergen Reactions  . Ceftriaxone Other (See Comments)    Bioxon makes her skin crawl  . Lactose Intolerance (Gi) Other (See Comments)    Gi upset  . Rofecoxib Other (See Comments)    Vioxx caused gi issues  . Sulfur Hives    Family History  Problem Relation Age of Onset  . Other Mother     CVA/ grief  . Heart disease Mother   . Heart attack Father   . Heart disease Father   . Heart attack Brother 47  . Cancer Neg Hx     breast or colon    Prior to Admission medications   Medication Sig Start Date End Date Taking? Authorizing Provider  amLODipine (NORVASC) 10 MG tablet Take 1 tablet (10 mg total) by mouth daily with breakfast. 02/18/13  Yes Jacques Navy, MD  aspirin 81 MG tablet Take 81 mg by mouth at bedtime.    Yes Historical Provider, MD  brimonidine (ALPHAGAN) 0.15 % ophthalmic solution Place 1 drop into the left eye 2 (two) times daily.   Yes Historical Provider, MD  calcium carbonate (TUMS) 500 MG chewable tablet Chew 2 tablets by mouth 2 (two) times daily. Takes 1 tablet at lunch and 1 tablet at dinner   Yes Historical Provider, MD  cyclobenzaprine (FLEXERIL) 5 MG tablet Take 5-10 mg by mouth at bedtime as needed for muscle spasms. Take 1-2 tabs at bedtime as needed   Yes Historical Provider, MD  diclofenac (VOLTAREN) 75 MG EC tablet Take 1 tablet by mouth 2 (two) times daily. 09/16/13  Yes Historical Provider, MD  fluticasone (FLONASE) 50 MCG/ACT nasal spray Place 1 spray into both nostrils daily. 02/18/13  Yes Jacques Navy, MD  hydrochlorothiazide (HYDRODIURIL) 50 MG tablet Take 1 tablet (50 mg total) by mouth daily with breakfast. 02/18/13  Yes Jacques Navy, MD  levothyroxine (SYNTHROID, LEVOTHROID) 100 MCG tablet Take 1 tablet (100 mcg total) by mouth daily before breakfast. 02/18/13  Yes Jacques Navy, MD  loratadine (CLARITIN) 10 MG tablet Take 10 mg by mouth daily as needed for allergies.   Yes Historical Provider, MD  losartan  (COZAAR) 100 MG tablet Take 1 tablet (100 mg total) by mouth daily with breakfast. 02/18/13  Yes Jacques Navy, MD  multivitamin [facility]) per tablet Take 1 tablet by mouth daily.    Yes Historical Provider, MD  naproxen sodium (ANAPROX) 220 MG tablet Take 220 mg by mouth daily as needed (pain). For pain   Yes Historical Provider, MD  omeprazole (PRILOSEC) 40 MG capsule Take 40 mg by mouth daily with lunch.    Yes Historical Provider, MD  oxyCODONE (OXY IR/ROXICODONE) 5 MG immediate release tablet Take 5 mg by mouth every 4 (four) hours as needed for severe pain.   Yes Historical Provider, MD  Polyethyl Glycol-Propyl Glycol (SYSTANE) 0.4-0.3 % SOLN Place 1 drop into both eyes 2 (two) times daily.   Yes Historical Provider, MD  potassium chloride (KLOR-CON 10) 10 MEQ tablet Take 1 tablet (10 mEq total) by mouth 3 (three) times  daily. 02/18/13 02/18/14 Yes Jacques Navy, MD  vitamin C (ASCORBIC ACID) 500 MG tablet Take 1,000 mg by mouth daily.   Yes Historical Provider, MD  vitamin E (VITAMIN E) 1000 UNIT capsule Take 1,000 Units by mouth daily.    Yes Historical Provider, MD     Physical Exam:  Filed Vitals:   09/28/13 1118 09/28/13 1420  BP: 128/57 120/53  Pulse: 95 77  Temp: 98.5 F (36.9 C) 97.3 F (36.3 C)  TempSrc: Oral Oral  Resp: 16 18  SpO2: 97% 97%    Constitutional: Vital signs reviewed. Elderly Female in no acute distress. Hard of hearing HEENT: no pallor, no icterus, dry oral mucosa,  Cardiovascular: RRR, S1 normal, S2 normal, no MRG Chest: CTAB, no wheezes, rales, or rhonchi Abdominal: Soft. , non-distended, bowel sounds are normal, suprapubic tenderness  GU: no CVA tenderness Ext: warm, no edema Neurological: Alert and oriented X2  Labs on Admission:  Basic Metabolic Panel:  Recent Labs Lab 09/28/13 1153  NA 137  K 5.1  CL 103  CO2 21  GLUCOSE 132*  BUN 32*  CREATININE 1.14*  CALCIUM 8.6   Liver Function Tests:  Recent Labs Lab 09/28/13 1153   AST 17  ALT 15  ALKPHOS 216*  BILITOT <0.2*  PROT 6.7  ALBUMIN 2.7*   No results found for this basename: LIPASE, AMYLASE,  in the last 168 hours No results found for this basename: AMMONIA,  in the last 168 hours CBC:  Recent Labs Lab 09/28/13 1153  WBC 15.2*  NEUTROABS 12.2*  HGB 11.0*  HCT 33.9*  MCV 81.7  PLT 438*   Cardiac Enzymes: No results found for this basename: CKTOTAL, CKMB, CKMBINDEX, TROPONINI,  in the last 168 hours BNP: No components found with this basename: POCBNP,  CBG: No results found for this basename: GLUCAP,  in the last 168 hours  Radiological Exams on Admission: Dg Hip Complete Left  09/28/2013   CLINICAL DATA:  Two episodes of falling over the last several days now with left hip pain  EXAM: LEFT HIP - COMPLETE 2+ VIEW  COMPARISON:  Left hip series of September 14, 2013  FINDINGS: The bones are osteopenic. There is mild symmetric narrowing of the hip joints. There is soft tissue calcification adjacent to the greater trochanter on the left which is stable. There is no acute fracture.  IMPRESSION: There is no acute bony abnormality of the left hip. There is mild symmetric narrowing of both hips.   Electronically Signed   By: David  [place]   On: 09/28/2013 12:14      Assessment/Plan  Principal Problem:   UTI (lower urinary tract infection) Admit to MedSurg. Patient given a dose of IV ciprofloxacin which I will continue. Follow urine culture. Patient is allergic to Rocephin.  Active Problems: Acute kidney injury  Likely combination of dehydration, worsened with use of NSAIDs , HCTZ and  ARB Will monitor with gentle hydration. avoid NSAIDs, and hold HCTZ and losartan -Monitoring a.m.  hypothyroidism Continue Synthroid  Anemia Likely chronic disease and appears stable  Hypertension Hold HCTZ and losartan given at the kidney injury. Blood pressure currently stable. Continue with amlodipine and  Type 2 diabetes mellitus Diet-controlled. Will  monitor on sliding scale insulin  GERD Continue PPI  Osteoarthritis of left hip  received cortisone injection as outpatient recently. Continue oxycodone. Will add prn ultracet. Avoid NSAIDs. Physical therapy evaluation. Daughter agree to have patient sent to SNF if appropriate.  Diet: Diabetic  DVT prophylaxis: sq heparin   Code Status: DNR Family Communication: discussed with daughter at bedside Disposition Plan: Admit to medical floor.  Eddie North [facility] Pager [phone]  Total time spent on admission :60 minutes  If 7PM-7AM, please contact night-coverage [email or web address] Password [email or web address] 09/28/2013, 3:08 PM

## 2013-09-28 NOTE — ED Provider Notes (Signed)
CSN: [id number]     Arrival date & time 09/28/13  1056 History   First MD Initiated Contact with Patient 09/28/13 1130     Chief Complaint  Patient presents with  . Groin Pain  . Hip Pain     (Consider location/radiation/quality/duration/timing/severity/associated sxs/prior Treatment) The history is provided by the patient and a relative.  Desiree [name] is a 80 y.o. female hx of DM, dementia, HL, HTN, osteoporosis here with L hip pain. Chronic L hip pain. Xray 2 weeks ago showed arthritis and got a cortisone shot a week ago. Pain improved initially but got worse. Last fall was about a week ago. Hasn't been on anything for pain recently. No fevers. Had dec PO intake due to pain. No vomiting.    Level V caveat- dementia   Past Medical History  Diagnosis Date  . Dementia   . Diabetes mellitus     type  II- diet controlled  . Hyperlipidemia   . Hypertension   . Osteoarthritis     hands  . Allergic rhinitis   . Anemia   . GERD (gastroesophageal reflux disease)   . Hypothyroidism   . Urinary incontinence     stress  . Blood transfusion   . Osteoporosis   . Rectal prolapse   . Vision loss of right eye   . Glaucoma     bilateral  . Macular degeneration   . Hard of hearing    Past Surgical History  Procedure Laterality Date  . Carpal tunnel release      bilateral  . Cataract extraction  2006    left  . Abdominal hysterectomy      nonmalignant reasons  . Vein ligation and stripping      left leg  . Right eye surgery  7-00  . Lumbar fusion  3-01    spinal cord leak- requiring 2 month admission   Family History  Problem Relation Age of Onset  . Other Mother     CVA/ grief  . Heart disease Mother   . Heart attack Father   . Heart disease Father   . Heart attack Brother 47  . Cancer Neg Hx     breast or colon   History  Substance Use Topics  . Smoking status: Former Games developer  . Smokeless tobacco: Never Used  . Alcohol Use: No   OB History   Grav Para Term  Preterm Abortions TAB SAB Ect Mult Living                 Review of Systems  Musculoskeletal:       L hip pain   All other systems reviewed and are negative.     Allergies  Ceftriaxone; Lactose intolerance (gi); Rofecoxib; and Sulfur  Home Medications   Prior to Admission medications   Medication Sig Start Date End Date Taking? Authorizing Provider  amLODipine (NORVASC) 10 MG tablet Take 1 tablet (10 mg total) by mouth daily with breakfast. 02/18/13  Yes Jacques Navy, MD  aspirin 81 MG tablet Take 81 mg by mouth at bedtime.    Yes Historical Provider, MD  brimonidine (ALPHAGAN) 0.15 % ophthalmic solution Place 1 drop into the left eye 2 (two) times daily.   Yes Historical Provider, MD  calcium carbonate (TUMS) 500 MG chewable tablet Chew 2 tablets by mouth 2 (two) times daily. Takes 1 tablet at lunch and 1 tablet at dinner   Yes Historical Provider, MD  cyclobenzaprine (FLEXERIL) 5 MG tablet  Take 5-10 mg by mouth at bedtime as needed for muscle spasms. Take 1-2 tabs at bedtime as needed   Yes Historical Provider, MD  diclofenac (VOLTAREN) 75 MG EC tablet Take 1 tablet by mouth 2 (two) times daily. 09/16/13  Yes Historical Provider, MD  fluticasone (FLONASE) 50 MCG/ACT nasal spray Place 1 spray into both nostrils daily. 02/18/13  Yes Jacques NavyMichael E Norins, MD  hydrochlorothiazide (HYDRODIURIL) 50 MG tablet Take 1 tablet (50 mg total) by mouth daily with breakfast. 02/18/13  Yes Jacques NavyMichael E Norins, MD  levothyroxine (SYNTHROID, LEVOTHROID) 100 MCG tablet Take 1 tablet (100 mcg total) by mouth daily before breakfast. 02/18/13  Yes Jacques NavyMichael E Norins, MD  loratadine (CLARITIN) 10 MG tablet Take 10 mg by mouth daily as needed for allergies.   Yes Historical Provider, MD  losartan (COZAAR) 100 MG tablet Take 1 tablet (100 mg total) by mouth daily with breakfast. 02/18/13  Yes Jacques NavyMichael E Norins, MD  multivitamin [facility](THERAGRAN) per tablet Take 1 tablet by mouth daily.    Yes Historical Provider, MD   naproxen sodium (ANAPROX) 220 MG tablet Take 220 mg by mouth daily as needed (pain). For pain   Yes Historical Provider, MD  omeprazole (PRILOSEC) 40 MG capsule Take 40 mg by mouth daily with lunch.    Yes Historical Provider, MD  oxyCODONE (OXY IR/ROXICODONE) 5 MG immediate release tablet Take 5 mg by mouth every 4 (four) hours as needed for severe pain.   Yes Historical Provider, MD  Polyethyl Glycol-Propyl Glycol (SYSTANE) 0.4-0.3 % SOLN Place 1 drop into both eyes 2 (two) times daily.   Yes Historical Provider, MD  potassium chloride (KLOR-CON 10) 10 MEQ tablet Take 1 tablet (10 mEq total) by mouth 3 (three) times daily. 02/18/13 02/18/14 Yes Jacques NavyMichael E Norins, MD  vitamin C (ASCORBIC ACID) 500 MG tablet Take 1,000 mg by mouth daily.   Yes Historical Provider, MD  vitamin E (VITAMIN E) 1000 UNIT capsule Take 1,000 Units by mouth daily.    Yes Historical Provider, MD   BP 120/53  Pulse 77  Temp(Src) 97.3 F (36.3 C) (Oral)  Resp 18  SpO2 97% Physical Exam  Nursing note and vitals reviewed. Constitutional:  Chronically ill, uncomfortable   HENT:  Head: Normocephalic.  Mouth/Throat: Oropharynx is clear and moist.  Eyes: Conjunctivae are normal. Pupils are equal, round, and reactive to light.  Neck: Normal range of motion.  Cardiovascular: Normal rate, regular rhythm and normal heart sounds.   Pulmonary/Chest: Effort normal and breath sounds normal. No respiratory distress. She has no wheezes. She has no rales.  Abdominal: Soft. Bowel sounds are normal.  + suprapubic tenderness, no CVAT   Musculoskeletal:  L hip nl ROM. No obvious spinal tenderness.   Neurological: She is alert.  No saddle anesthesia. Strength 4/5 bilateral lower extremities. 2+ pulses   Skin: Skin is warm and dry.  Psychiatric: She has a normal mood and affect. Her behavior is normal. Judgment and thought content normal.    ED Course  Procedures (including critical care time) Labs Review Labs Reviewed  CBC  WITH DIFFERENTIAL - Abnormal; Notable for the following:    WBC 15.2 (*)    Hemoglobin 11.0 (*)    HCT 33.9 (*)    Platelets 438 (*)    Neutrophils Relative % 80 (*)    Neutro Abs 12.2 (*)    All other components within normal limits  COMPREHENSIVE METABOLIC PANEL - Abnormal; Notable for the following:    Glucose, Bld  132 (*)    BUN 32 (*)    Creatinine, Ser 1.14 (*)    Albumin 2.7 (*)    Alkaline Phosphatase 216 (*)    Total Bilirubin <0.2 (*)    GFR calc non Af Amer 41 (*)    GFR calc Af Amer 48 (*)    All other components within normal limits  URINALYSIS, ROUTINE W REFLEX MICROSCOPIC - Abnormal; Notable for the following:    APPearance CLOUDY (*)    Hgb urine dipstick MODERATE (*)    Nitrite POSITIVE (*)    Leukocytes, UA MODERATE (*)    All other components within normal limits  URINE MICROSCOPIC-ADD ON - Abnormal; Notable for the following:    Bacteria, UA MANY (*)    All other components within normal limits  URINE CULTURE    Imaging Review Dg Hip Complete Left  09/28/2013   CLINICAL DATA:  Two episodes of falling over the last several days now with left hip pain  EXAM: LEFT HIP - COMPLETE 2+ VIEW  COMPARISON:  Left hip series of September 14, 2013  FINDINGS: The bones are osteopenic. There is mild symmetric narrowing of the hip joints. There is soft tissue calcification adjacent to the greater trochanter on the left which is stable. There is no acute fracture.  IMPRESSION: There is no acute bony abnormality of the left hip. There is mild symmetric narrowing of both hips.   Electronically Signed   By: Jizel Cheeks  [place]   On: 09/28/2013 12:14     EKG Interpretation None      MDM   Final diagnoses:  None   Jenevie SIANI UTKE is a 80 y.o. female here with dehydration, leg pain. Likely pain from arthritis. Appears slightly dehydrated. Will get labs, hydrate patient, get xray.   2:30 PM UA + UTI. WBC 15. Xray showed no acute abnormality. Also Cr. Slightly elevated compared to  baseline, likely from dehydration. Given cipro (allergic to ceftriaxone). Will admit for UTI, dehydration.     Richardean Canal, MD 09/28/13 [phone]

## 2013-09-28 NOTE — ED Notes (Signed)
Patient was seen 09/14/2013 for left hip pain and was treated for bursitis. At this time patient daughter states the pain is worst. Patient was given cortisone shot on 7/13. At this time patient states she is having pain in her tailbone, pelvis and left hip to the point patient is not eating, drinking or walking.

## 2013-09-28 NOTE — Progress Notes (Signed)
Utilization Review completed.  Mays Paino RN CM  

## 2013-09-28 NOTE — ED Notes (Signed)
Patient transported to X-ray 

## 2013-09-29 DIAGNOSIS — N3000 Acute cystitis without hematuria: Secondary | ICD-10-CM

## 2013-09-29 DIAGNOSIS — D649 Anemia, unspecified: Secondary | ICD-10-CM

## 2013-09-29 DIAGNOSIS — E440 Moderate protein-calorie malnutrition: Secondary | ICD-10-CM | POA: Insufficient documentation

## 2013-09-29 LAB — BASIC METABOLIC PANEL
Anion gap: 10 (ref 5–15)
BUN: 23 mg/dL (ref 6–23)
CO2: 22 meq/L (ref 19–32)
CREATININE: 1.03 mg/dL (ref 0.50–1.10)
Calcium: 8.2 mg/dL — ABNORMAL LOW (ref 8.4–10.5)
Chloride: 104 mEq/L (ref 96–112)
GFR calc Af Amer: 54 mL/min — ABNORMAL LOW (ref >90)
GFR calc non Af Amer: 46 mL/min — ABNORMAL LOW (ref >90)
GLUCOSE: 97 mg/dL (ref 70–99)
Potassium: 5.1 mEq/L (ref 3.7–5.3)
Sodium: 136 mEq/L — ABNORMAL LOW (ref 137–147)

## 2013-09-29 LAB — CBC
HEMATOCRIT: 29.4 % — AB (ref 36.0–46.0)
HEMOGLOBIN: 9.4 g/dL — AB (ref 12.0–15.0)
MCH: 26.5 pg (ref 26.0–34.0)
MCHC: 32 g/dL (ref 30.0–36.0)
MCV: 82.8 fL (ref 78.0–100.0)
Platelets: 379 10*3/uL (ref 150–400)
RBC: 3.55 MIL/uL — AB (ref 3.87–5.11)
RDW: 15.1 % (ref 11.5–15.5)
WBC: 12.8 10*3/uL — AB (ref 4.0–10.5)

## 2013-09-29 LAB — GLUCOSE, CAPILLARY
GLUCOSE-CAPILLARY: 141 mg/dL — AB (ref 70–99)
GLUCOSE-CAPILLARY: 95 mg/dL (ref 70–99)
GLUCOSE-CAPILLARY: 124 mg/dL — AB (ref 70–99)
Glucose-Capillary: 96 mg/dL (ref 70–99)

## 2013-09-29 MED ORDER — ENSURE COMPLETE PO LIQD
237.0000 mL | ORAL | Status: DC
  Administered 2013-09-29: 237 mL via ORAL

## 2013-09-29 NOTE — Care Management Note (Signed)
    Page 1 of 1   09/29/2013     1:56:24 PM CARE MANAGEMENT NOTE 09/29/2013  Patient:  Desiree [name]   Account Number:  [id number]  Date Initiated:  09/29/2013  Documentation initiated by:  ,  Subjective/Objective Assessment:   adm: Decreased po intake, suprapubic pain and dehydration for 1 week     Action/Plan:   HH vs. SNF   Anticipated DC Date:  09/30/2013   Anticipated DC Plan:  SKILLED NURSING FACILITY      DC Planning Services  CM consult      Choice offered to / List presented to:             Status of service:  Completed, signed off Medicare Important Message given?  YES (If response is "NO", the following Medicare IM given date fields will be blank) Date Medicare IM given:  09/28/2013 Medicare IM given by:   Date Additional Medicare IM given:   Additional Medicare IM given by:    Discharge Disposition:  SKILLED NURSING FACILITY  Per UR Regulation:    If discussed at Long Length of Stay Meetings, dates discussed:    Comments:  09/29/13 13:50 CM met with pt and pt's daughter, Desiree [name] [phone] in pt's room to confirm desire for SNF-Short Term rehab.  Both pt and daughter confirm SNF is their choice.  CSW aware and will arrange.  No other CM needs were communicated.   , BSN, CM [phone].   

## 2013-09-29 NOTE — Evaluation (Signed)
Physical Therapy Evaluation Patient Details Name: Desiree [name] MRN: [id number] DOB: [birth year] Today's Date: 09/29/2013   History of Present Illness  80 year old female with history of hypertension, hypothyroidism, mild dementia, osteoarthritis of the hip with Cortizone injection by Dr. Bernardo Heater as outpatient was admitted for generalized weakness, poor mobility and complains of suprapubic pain and discomfort for past one week, and pt found to have UTI.  Xray of L hip was negative of acute findings.  Clinical Impression  Pt admitted with above. Pt currently with functional limitations due to the deficits listed below (see PT Problem List).  Pt will benefit from skilled PT to increase their independence and safety with mobility to allow discharge to the venue listed below.  Daughter reports functional decline of pt over the last month requiring pt to live with her daughter.  Pt reports 2 falls in the last month.  Pt and daughter agreeable for ST-SNF in hopes to return pt to her mobility baseline and improve her level of independence.      Follow Up Recommendations SNF    Equipment Recommendations  None recommended by PT    Recommendations for Other Services       Precautions / Restrictions Precautions Precautions: Fall      Mobility  Bed Mobility Overal bed mobility: Needs Assistance Bed Mobility: Supine to Sit     Supine to sit: HOB elevated;Supervision        Transfers Overall transfer level: Needs assistance Equipment used: Rolling walker (2 wheeled) Transfers: Sit to/from Stand Sit to Stand: Min assist         General transfer comment: verbal cues for safe technique   Ambulation/Gait Ambulation/Gait assistance: Min assist Ambulation Distance (Feet): 80 Feet Assistive device: Rolling walker (2 wheeled) Gait Pattern/deviations: Step-through pattern;Trunk flexed;Decreased stride length Gait velocity: decr   General Gait Details: denies pain, reports R groin  discomfort only, no unsteady observed with RW, fatigues quickly  Stairs            Wheelchair Mobility    Modified Rankin (Stroke Patients Only)       Balance Overall balance assessment: History of Falls (2 falls in the last month)                                           Pertinent Vitals/Pain No pain, reports R groin discomfort during ambulation, repositioned, activity to tolerance    Home Living Family/patient expects to be discharged to:: Private residence Living Arrangements: Alone Available Help at Discharge: Family;Available PRN/intermittently Type of Home: House Home Access: Stairs to enter     Home Layout: Able to live on main level with bedroom/bathroom Home Equipment: Dan Humphreys - 2 wheels;Cane - single point      Prior Function Level of Independence: Needs assistance   Gait / Transfers Assistance Needed: usually modified independent at home alone however for past month has been requiring more assist and living with her daughter           Hand Dominance        Extremity/Trunk Assessment               Lower Extremity Assessment: Generalized weakness         Communication   Communication: HOH  Cognition Arousal/Alertness: Awake/alert Behavior During Therapy: WFL for tasks assessed/performed Overall Cognitive Status: History of cognitive impairments - at baseline  General Comments      Exercises        Assessment/Plan    PT Assessment Patient needs continued PT services  PT Diagnosis Difficulty walking;Generalized weakness   PT Problem List Decreased strength;Decreased activity tolerance;Decreased mobility  PT Treatment Interventions Gait training;DME instruction;Balance training;Functional mobility training;Patient/family education;Therapeutic activities;Therapeutic exercise   PT Goals (Current goals can be found in the Care Plan section) Acute Rehab PT Goals PT Goal Formulation:  With patient/family Time For Goal Achievement: 10/06/13 Potential to Achieve Goals: Good    Frequency Min 3X/week   Barriers to discharge        Co-evaluation               End of Session   Activity Tolerance: Patient tolerated treatment well Patient left: in chair;with call bell/phone within reach;with family/visitor present           Time: 1001-1015 PT Time Calculation (min): 14 min   Charges:   PT Evaluation $Initial PT Evaluation Tier I: 1 Procedure PT Treatments $Gait Training: 8-22 mins   PT G Codes:          Chriss Redel,KATHrine E 09/29/2013, 11:41 AM Zenovia JarredKati Rawn Quiroa, PT, DPT 09/29/2013 Pager: [phone]

## 2013-09-29 NOTE — Progress Notes (Signed)
Clinical Social Work Department BRIEF PSYCHOSOCIAL ASSESSMENT 09/29/2013  Patient:  Desiree [name], Desiree [name]     Account Number:  [id number]     Admit date:  09/28/2013  Clinical Social Worker:  Ulyess Blossom  Date/Time:  09/29/2013 03:00 PM  Referred by:  Physician  Date Referred:  09/29/2013 Referred for  SNF Placement   Other Referral:   Interview type:  Patient Other interview type:   and patient daughter at bedside    PSYCHOSOCIAL DATA Living Status:  ALONE Admitted from facility:   Level of care:   Primary support name:  Desiree [name]/daughter/ [phone] Primary support relationship to patient:  CHILD, ADULT Degree of support available:   strong    CURRENT CONCERNS Current Concerns  Post-Acute Placement   Other Concerns:    SOCIAL WORK ASSESSMENT / PLAN CSW received referral for New SNF.    CSW met with pt and pt daughter at bedside. CSW introduced self and explained role. Pt daughter shared that pt lives alone. Pt daughter discussed that pt has been managing well at home, but recently began becoming weaker and having falls at home. Pt daughter shared that pt would really like to return home, but recognizes that due to weakness that she will likely have to go to short term rehab prior to return home. CSW discussed with pt about short term rehab. Pt has never been to rehab at SNF before, but was familiar with what facility would offer. Pt agreeable to [facility] search. CSW clarified pt and pt daughter's questions and concerns.    CSW completed FL2 and initiated SNF search to [facility].    Pt has [employer] which requires authorization prior to pt transfer to SNF.    CSW to follow up with pt and pt daughter regarding SNF bed offers.    CSW to continue to follow to assist with pt disposition needs and facilitate pt discharge needs once pt medically ready for discharge and insurance authorization received.   Assessment/plan status:   Psychosocial Support/Ongoing Assessment of Needs Other assessment/ plan:   discharge planning   Information/referral to community resources:   [facility] list    PATIENT'S/FAMILY'S RESPONSE TO PLAN OF CARE: Pt alert and orientedx 3. Pt pleasant and appropriately engaged in conversation. Pt aware that she will likely not progress enough to return home and agreeable to short term rehab. Pt daughter feels that short term rehab will be most safe and appropriate plan for pt.    Alison Murray, MSW, [place] Work [phone]

## 2013-09-29 NOTE — Progress Notes (Addendum)
Clinical Social Work Department CLINICAL SOCIAL WORK PLACEMENT NOTE 09/29/2013  Patient:  [name],[name]  Account Number:  [id number] Admit date:  09/28/2013  Clinical Social Worker:  Jacelyn GripSUZANNA BYRD, LCSWA  Date/time:  09/29/2013 03:00 PM  Clinical Social Work is seeking post-discharge placement for this patient at the following level of care:   SKILLED NURSING   (*CSW will update this form in Epic as items are completed)   09/29/2013  Patient/family provided with [facility] of Clinical Social Work's list of facilities offering this level of care within the geographic area requested by the patient (or if unable, by the patient's family).  09/29/2013  Patient/family informed of their freedom to choose among providers that offer the needed level of care, that participate in Medicare, Medicaid or managed care program needed by the patient, have an available bed and are willing to accept the patient.  09/29/2013  Patient/family informed of [facility]' ownership interest in [facility], as well as of the fact that they are under no obligation to receive care at this facility.  PASARR submitted to EDS on 09/29/2013 PASARR number received on 09/29/2013  FL2 transmitted to all facilities in geographic area requested by pt/family on  09/29/2013 FL2 transmitted to all facilities within larger geographic area on   Patient informed that his/her managed care company has contracts with or will negotiate with  certain facilities, including the following:     Patient/family informed of bed offers received:  09/30/2013 [facility] Physician recommends and patient chooses bed at    Patient to be transferred [facility]  [facility] Patient to be transferred to facility by 10/01/2013 Patient and family notified of transfer on 10/01/2013 Name of family member notified:  Pt and pt daughter, [name] notified at bedside  The  following physician request were entered in Epic:   Additional Comments:   Loletta SpecterSuzanna Yulisa Chirico, MSW, LCSW Clinical Social Work [phone]

## 2013-09-29 NOTE — Progress Notes (Addendum)
Patient ID: Desiree [name], female   DOB: Dec 10, 1920, 80 y.o.   MRN: [id number] [facility] PROGRESS NOTE  Desiree [name] NWG:[id number] DOB: Sep 03, 1920 DOA: 09/28/2013 PCP: Illene Regulus, MD  Brief narrative: 80 year old female with past medical history of hypertension, hypothyroidism, mild dementia, osteoarthritis of the hip managed with Cortizone injection by Dr. Bernardo Heater as outpatient who presented to [facility] ED 09/28/2013 with poor PO intake, weakness, falls, and suprapubic pain. She was found to have UTI on admission and was started on Cipro. Her initial left hip x ray did not reveal acute fracture.  Assessment/Plan:  Principal Problem:   Failure to thrive / dehydration / weakness  Secondary to combination of mild dementia, osteoarthritis, UTI  Needs PT eval and likely SNF placement on discharge which family would prefer because pt lives alone at home and is high risk for falls  Continue supplemental calcium   Treatment for UTI with Cipro Active Problems:   E.Coli UTI  Now on cipro; will follow up sensitivity report    HYPOTHYROIDISM  Continue synthroid 100 mcg daily    DIABETES MELLITUS, TYPE II  Continue sliding scale insulin for now until PO intake improves    Acute renal failure  Secondary to UTI and dehydration  Continue IV fluids  Creatinine WNL this am    HYPERTENSION  Continue Norvasc 10 mg daily    Anemia  Likely iron deficiency  No signs of bleeding  Hemoglobin is 9.4 and there is no current requirement for transfusion     Malnutrition of moderate degree  Pt meets criteria for moderate malnutrition in the context of acute illness as evidenced by 5% body weight loss in one month, PO intake <75% for one month, moderate muscle wasting and subcutaneous fat loss .   DVT prophylaxis: heparin subQ while pt is in hospital   Code Status: DNR/DNI  Family Communication: plan of care discussed with the patient Disposition Plan: home when stable   Manson Passey, MD  [facility] Pager [phone]  If 7PM-7AM, please contact night-coverage [email or web address] Password [facility] 09/29/2013, 11:25 AM   LOS: 1 day   Consultants:  None   Procedures:  None   Antibiotics:  Cipro 09/28/2013 -->  HPI/Subjective: No acute overnight events.  Objective: Filed Vitals:   09/28/13 1531 09/28/13 1609 09/28/13 2120 09/29/13 0414  BP: 120/55 132/58 129/59 116/54  Pulse: 72 83 76 78  Temp:  98.3 F (36.8 C) 98 F (36.7 C) 98.2 F (36.8 C)  TempSrc:  Oral Oral Oral  Resp: 14 16 18 16   Height:  5\' 3"  (1.6 m)    Weight:  52.164 kg (115 lb)    SpO2: 96% 100% 99% 98%    Intake/Output Summary (Last 24 hours) at 09/29/13 1125 Last data filed at 09/29/13 0414  Gross per 24 hour  Intake    600 ml  Output      0 ml  Net    600 ml    Exam:   General:  Pt is alert, follows commands appropriately, not in acute distress  Cardiovascular: Regular rate and rhythm, S1/S2 appreciated   Respiratory: Clear to auscultation bilaterally, no wheezing, no crackles, no rhonchi  Abdomen: Soft, non tender, non distended, bowel sounds present  Extremities: No edema, pulses DP and PT palpable bilaterally  Neuro: Grossly nonfocal  Data Reviewed: Basic Metabolic Panel:  Recent Labs Lab 09/28/13 1153 09/29/13 0408  NA 137 136*  K 5.1 5.1  CL 103 104  CO2 21 22  GLUCOSE 132* 97  BUN 32* 23  CREATININE 1.14* 1.03  CALCIUM 8.6 8.2*   Liver Function Tests:  Recent Labs Lab 09/28/13 1153  AST 17  ALT 15  ALKPHOS 216*  BILITOT <0.2*  PROT 6.7  ALBUMIN 2.7*   No results found for this basename: LIPASE, AMYLASE,  in the last 168 hours No results found for this basename: AMMONIA,  in the last 168 hours CBC:  Recent Labs Lab 09/28/13 1153 09/29/13 0408  WBC 15.2* 12.8*  NEUTROABS 12.2*  --   HGB 11.0* 9.4*  HCT 33.9* 29.4*  MCV 81.7 82.8  PLT 438* 379   Cardiac Enzymes: No results found for this basename: CKTOTAL, CKMB, CKMBINDEX,  TROPONINI,  in the last 168 hours BNP: No components found with this basename: POCBNP,  CBG:  Recent Labs Lab 09/28/13 1626 09/28/13 2118 09/29/13 0752  GLUCAP 111* 132* 96    Recent Results (from the past 240 hour(s))  URINE CULTURE     Status: None   Collection Time    09/28/13 11:53 AM      Result Value Ref Range Status   Specimen Description URINE, CLEAN CATCH   Final   Special Requests NONE   Final   Culture  Setup Time     Final   Value: 09/28/2013 14:51     Performed at [facility]   Colony Count     Final   Value: >=100,000 COLONIES/ML     Performed at [facility]   Culture     Final   Value: ESCHERICHIA COLI     Performed at [facility]   Report Status PENDING   Incomplete     Studies: Dg Hip Complete Left 09/28/2013    IMPRESSION: There is no acute bony abnormality of the left hip. There is mild symmetric narrowing of both hips.      Scheduled Meds: . amLODipine  10 mg Oral Daily  . aspirin  81 mg Oral QHS  . brimonidine  1 drop Left Eye BID  . calcium carbonate  2 tablet Oral BID  . ciprofloxacin  200 mg Intravenous Q12H  . feeding supplement   237 mL Oral Q24H  . fluticasone  1 spray Each Nare Daily  . heparin  5,000 Units Subcutaneous 3 times per day  . insulin aspart  0-15 Units Subcutaneous TID WC  . levothyroxine  100 mcg Oral QAC breakfast  . multivitamin   1 tablet Oral Daily  . pantoprazole  40 mg Oral Daily  . polyvinyl alcohol  1 drop Both Eyes BID  . potassium chloride  10 mEq Oral TID  . vitamin C  1,000 mg Oral Daily  . vitamin E  1,000 Units Oral Daily   Continuous Infusions: . sodium chloride 75 mL/hr at 09/28/13 1615

## 2013-09-29 NOTE — Progress Notes (Signed)
INITIAL NUTRITION ASSESSMENT  DOCUMENTATION CODES Per approved criteria  -Non-severe (moderate) malnutrition in the context of acute illness or injury  Pt meets criteria for moderate MALNUTRITION in the context of acute illness as evidenced by 5% body weight loss in one month, PO intake <75% for one month, moderate muscle wasting and subcutaneous fat loss .   INTERVENTION: -Recommend strawberry Ensure Complete Q24H -Recommend 8PM nourishment -Continue to encourage improving PO intake -Will continue to monitor  NUTRITION DIAGNOSIS: Inadequate oral intake related to nausea/abd pain as evidenced by PO intake <75%, 5% body weight loss in one month.   Goal: Pt to meet >/= 90% of their estimated nutrition needs    Monitor:  Total protein/energy intake, labs, weights  Reason for Assessment: MST  80 y.o. female  Admitting Dx: UTI (lower urinary tract infection)  ASSESSMENT: 80 year old female with history of hypertension, hypothyroidism, mild dementia, osteoarthritis of the hip with descent Cortizone injection by Dr. Bernardo HeaterWaldorf as outpatient was brought to the ED by her daughter with decreased by mouth intake, generalized weakness, poor mobility and complains of suprapubic pain and discomfort for past one week.  -Pt's daughter reported overall decreased intake for past one month d/t feelings of nausea and abd pain -Diet recall indicates pt with  2-3 small meals/day, with Ensure Complete once or twice daily. Breakfast consisting of cereal and fruit, lunch typically half sandwich and dinner is Ensure or crackers and peanut butter -Daughter noted pt's hx of reflux also inhibits intake of larger meals and certain higher fat foods. Has been trying to encourage fluids/snacks; however pt has been refusing -Endorsed an unintentional 6-8 lbs weight loss in past one month -Appetite is improving during admit, is eating 100% of meals w/out nausea or abd pain. Will order Ensure Complete once daily to  encourage meal intake. Discussed addition of Carnation Instant Breakfast and snacks as needed.  -Some muscle wasting and subcutaneous fat loss likely related to ageing process -Nutrition Focused Physical Exam:  Subcutaneous Fat:  Orbital Region: WDL Upper Arm Region: moderate wasting Thoracic and Lumbar Region: n/a  Muscle:  Temple Region: WDL Clavicle Bone Region: WDL Clavicle and Acromion Bone Region: moderate Scapular Bone Region: moderate Dorsal Hand: moderate Patellar Region: moderate Anterior Thigh Region: severe wasting Posterior Calf Region: moderate  Edema: none noted    Height: Ht Readings from Last 1 Encounters:  09/28/13 5\' 3"  (1.6 m)    Weight: Wt Readings from Last 1 Encounters:  09/28/13 115 lb (52.164 kg)    Ideal Body Weight: 115 lbs  % Ideal Body Weight: 100%  Wt Readings from Last 10 Encounters:  09/28/13 115 lb (52.164 kg)  09/14/13 121 lb (54.885 kg)  02/18/13 128 lb (58.06 kg)  08/14/12 126 lb 12.8 oz (57.516 kg)  12/10/11 120 lb (54.432 kg)  12/04/11 121 lb 9.6 oz (55.157 kg)  10/31/11 126 lb 12.8 oz (57.516 kg)  10/18/11 123 lb (55.792 kg)  10/18/11 123 lb (55.792 kg)  10/10/11 123 lb (55.792 kg)    Usual Body Weight: 121-125 lbs  % Usual Body Weight: 95%  BMI:  Body mass index is 20.38 kg/(m^2).  Estimated Nutritional Needs: Kcal: 1300-1500 Protein: 60-70 gram Fluid: >/= 1500 ml/daily  Skin: WDL  Diet Order: Carb Control  EDUCATION NEEDS: -Education needs addressed   Intake/Output Summary (Last 24 hours) at 09/29/13 1000 Last data filed at 09/29/13 0414  Gross per 24 hour  Intake    600 ml  Output      0  ml  Net    600 ml    Last BM: 7/19   Labs:   Recent Labs Lab 09/28/13 1153 09/29/13 0408  NA 137 136*  K 5.1 5.1  CL 103 104  CO2 21 22  BUN 32* 23  CREATININE 1.14* 1.03  CALCIUM 8.6 8.2*  GLUCOSE 132* 97    CBG (last 3)   Recent Labs  09/28/13 1626 09/28/13 2118 09/29/13 0752  GLUCAP 111*  132* 96    Scheduled Meds: . amLODipine  10 mg Oral Daily  . aspirin  81 mg Oral QHS  . brimonidine  1 drop Left Eye BID  . calcium carbonate  2 tablet Oral BID  . ciprofloxacin  200 mg Intravenous Q12H  . feeding supplement (ENSURE COMPLETE)  237 mL Oral Q24H  . fluticasone  1 spray Each Nare Daily  . heparin  5,000 Units Subcutaneous 3 times per day  . insulin aspart  0-15 Units Subcutaneous TID WC  . levothyroxine  100 mcg Oral QAC breakfast  . multivitamin with minerals  1 tablet Oral Daily  . pantoprazole  40 mg Oral Daily  . polyvinyl alcohol  1 drop Both Eyes BID  . potassium chloride  10 mEq Oral TID  . vitamin C  1,000 mg Oral Daily  . vitamin E  1,000 Units Oral Daily    Continuous Infusions: . sodium chloride 75 mL/hr at 09/28/13 1615    Past Medical History  Diagnosis Date  . Dementia   . Diabetes mellitus     type  II- diet controlled  . Hyperlipidemia   . Hypertension   . Osteoarthritis     hands  . Allergic rhinitis   . Anemia   . GERD (gastroesophageal reflux disease)   . Hypothyroidism   . Urinary incontinence     stress  . Blood transfusion   . Osteoporosis   . Rectal prolapse   . Vision loss of right eye   . Glaucoma     bilateral  . Macular degeneration   . Hard of hearing   . PONV (postoperative nausea and vomiting)     Past Surgical History  Procedure Laterality Date  . Carpal tunnel release      bilateral  . Cataract extraction  2006    left  . Abdominal hysterectomy      nonmalignant reasons  . Vein ligation and stripping      left leg  . Right eye surgery  7-00  . Lumbar fusion  3-01    spinal cord leak- requiring 2 month admission    Lloyd Huger MS RD LDN Clinical Dietitian Pager:[phone]

## 2013-09-30 LAB — URINE CULTURE

## 2013-09-30 LAB — GLUCOSE, CAPILLARY
GLUCOSE-CAPILLARY: 180 mg/dL — AB (ref 70–99)
Glucose-Capillary: 88 mg/dL (ref 70–99)
Glucose-Capillary: 100 mg/dL — ABNORMAL HIGH (ref 70–99)
Glucose-Capillary: 118 mg/dL — ABNORMAL HIGH (ref 70–99)

## 2013-09-30 NOTE — Progress Notes (Signed)
CSW continuing to follow for disposition planning.   CSW met with pt and pt daughter at bedside this morning.  CSW provided SNF bed offers.  Pt daughter plans to tour SNF facilities today in order to assist pt with a decision re: SNF. CSW discussed with pt and pt daughter that per MD, anticipate discharge tomorrow to SNF.  Pt daughter has CSW contact information in order to notify CSW when decision is made.  CSW faxed pt clinical information to [facility] in order to initiate pt insurance authorization.  CSW to continue to follow and facilitate pt discharge needs when pt medically ready for discharge and insurance authorization received.  Alison Murray, MSW, [place] Work [phone]

## 2013-09-30 NOTE — Progress Notes (Signed)
CSW received notification from pt daughter that pt and pt daughter had made decision regarding SNF placement.  CSW met with pt and pt daughter at bedside. Pt daughter stated that pt and pt daughter decided on [facility].   CSW contacted facility and left voice message.  CSW to continue to follow and facilitate pt discharge needs to [facility] when pt medically ready for discharge and insurance authorization received.  Alison Murray, MSW, [place] Work [phone]

## 2013-09-30 NOTE — Progress Notes (Signed)
Physical Therapy Treatment Patient Details Name: Sammuel Cooperretta H Farina MRN: [id number] DOB: November 20, 1920 Today's Date: 09/30/2013    History of Present Illness 80 year old female with history of hypertension, hypothyroidism, mild dementia, osteoarthritis of the hip with Cortizone injection by Dr. Bernardo HeaterWaldorf as outpatient was admitted for generalized weakness, poor mobility and complains of suprapubic pain and discomfort for past one week, and pt found to have UTI.  Xray of L hip was negative of acute findings.    PT Comments    Assisted pt OOB to amb to BR then in hallway.  Pt required increased time and one sitting rest break. Unsteady gait esp with turns and around obstacles.  Pt will need ST Rehab at SNF.  Follow Up Recommendations  SNF ([facility])     Equipment Recommendations       Recommendations for Other Services       Precautions / Restrictions Precautions Precautions: Fall Precaution Comments: HOH    Mobility  Bed Mobility Overal bed mobility: Needs Assistance Bed Mobility: Supine to Sit;Sit to Supine     Supine to sit: Min guard Sit to supine: Min assist   General bed mobility comments: increased asssit getting back into bed  Transfers Overall transfer level: Needs assistance Equipment used: Rolling walker (2 wheeled) Transfers: Sit to/from Stand Sit to Stand: Min assist;Mod assist         General transfer comment: 50% verbal cues for safe technique esp with turn completion prior to sit and hand placement prior to sit to control decend  Ambulation/Gait Ambulation/Gait assistance: Min assist Ambulation Distance (Feet): 73 Feet Assistive device: Rolling walker (2 wheeled) Gait Pattern/deviations: Step-through pattern;Trunk flexed;Narrow base of support Gait velocity: decreased   General Gait Details: pt unable to stand erect (very kyphotic posture, near 90 degrees).  Mild unsteady esp negociating around room and in bathroom.  Limited activity tolerance  requiring one sitting rest break.     Stairs            Wheelchair Mobility    Modified Rankin (Stroke Patients Only)       Balance                                    Cognition                            Exercises      General Comments        Pertinent Vitals/Pain No c/o pain    Home Living                      Prior Function            PT Goals (current goals can now be found in the care plan section) Progress towards PT goals: Progressing toward goals    Frequency  Min 3X/week    PT Plan      Co-evaluation             End of Session Equipment Utilized During Treatment: Gait belt Activity Tolerance: Patient tolerated treatment well Patient left: in bed;with call bell/phone within reach     Time: [id number] PT Time Calculation (min): 24 min  Charges:  $Gait Training: 8-22 mins $Therapeutic Activity: 8-22 mins                    G Codes:  Rica Koyanagi  PTA [facility]  Acute  Rehab Pager      [phone]

## 2013-09-30 NOTE — Progress Notes (Signed)
Patient ID: Desiree [name], female   DOB: 26-Nov-1920, 80 y.o.   MRN: [id number] [facility] PROGRESS NOTE  LINDALOU SOLTIS WUJ:[id number] DOB: February 04, 1921 DOA: 09/28/2013 PCP: Illene Regulus, MD  Brief narrative: 80 year old female with past medical history of hypertension, hypothyroidism, mild dementia, osteoarthritis of the hip managed with Cortizone injection by Dr. Bernardo Heater as outpatient who presented to [facility] ED 09/28/2013 with poor PO intake, weakness, falls, and suprapubic pain. She was found to have UTI on admission and was started on Cipro. Her initial left hip x ray did not reveal acute fracture.   Assessment/Plan:   Principal Problem:  Failure to thrive / dehydration / weakness  Secondary to combination of mild dementia, osteoarthritis, UTI  Continue supplemental calcium  Treatment for UTI with Cipro SNF on discharge  Active Problems:  E.Coli UTI  Sens report includes cipro and pt sens to cipro so will continue this abx HYPOTHYROIDISM  Continue synthroid 100 mcg daily  DIABETES MELLITUS, TYPE II  Continue sliding scale insulin for now Acute renal failure  Secondary to UTI and dehydration  Creatinine WNL this am  HYPERTENSION  Continue Norvasc 10 mg daily  Anemia  Likely iron deficiency  No signs of bleeding  Hemoglobin is 9.4; no current indication for transfusion  Malnutrition of moderate degree  Pt meets criteria for moderate malnutrition in the context of acute illness as evidenced by 5% body weight loss in one month, PO intake <75% for one month, moderate muscle wasting and subcutaneous fat loss .  DVT prophylaxis: heparin subQ while pt is in hospital   Code Status: DNR/DNI  Family Communication: plan of care discussed with the patient and her daughter at the bedside  Disposition Plan: to SNF likely in next 24 hours   Consultants:  None  Procedures:  None  Antibiotics:  Cipro 09/28/2013 -->   Manson Passey, MD  [facility] Pager [phone]  If  7PM-7AM, please contact night-coverage [email or web address] Password [email or web address] 09/30/2013, 10:00 AM   LOS: 2 days    HPI/Subjective: No acute overnight events.  Objective: Filed Vitals:   09/29/13 0414 09/29/13 1445 09/29/13 2018 09/30/13 0442  BP: 116/54 113/59 109/50 123/50  Pulse: 78 87 80 80  Temp: 98.2 F (36.8 C) 98.8 F (37.1 C) 98.8 F (37.1 C) 97.6 F (36.4 C)  TempSrc: Oral Oral Oral Oral  Resp: 16 14 16 16   Height:      Weight:      SpO2: 98% 96% 97% 98%    Intake/Output Summary (Last 24 hours) at 09/30/13 1000 Last data filed at 09/30/13 0532  Gross per 24 hour  Intake   3351 ml  Output      0 ml  Net   3351 ml    Exam:   General:  Pt is not in acute distress  Cardiovascular: Regular rate and rhythm, S1/S2, no murmurs  Respiratory: bilateral air entry, no wheezing   Abdomen: Soft, non tender, non distended, bowel sounds present  Extremities: Pulses DP and PT palpable bilaterally  Neuro: Grossly nonfocal  Data Reviewed: Basic Metabolic Panel:  Recent Labs Lab 09/28/13 1153 09/29/13 0408  NA 137 136*  K 5.1 5.1  CL 103 104  CO2 21 22  GLUCOSE 132* 97  BUN 32* 23  CREATININE 1.14* 1.03  CALCIUM 8.6 8.2*   Liver Function Tests:  Recent Labs Lab 09/28/13 1153  AST 17  ALT 15  ALKPHOS 216*  BILITOT <0.2*  PROT 6.7  ALBUMIN 2.7*   No  results found for this basename: LIPASE, AMYLASE,  in the last 168 hours No results found for this basename: AMMONIA,  in the last 168 hours CBC:  Recent Labs Lab 09/28/13 1153 09/29/13 0408  WBC 15.2* 12.8*  NEUTROABS 12.2*  --   HGB 11.0* 9.4*  HCT 33.9* 29.4*  MCV 81.7 82.8  PLT 438* 379   Cardiac Enzymes: No results found for this basename: CKTOTAL, CKMB, CKMBINDEX, TROPONINI,  in the last 168 hours BNP: No components found with this basename: POCBNP,  CBG:  Recent Labs Lab 09/29/13 0752 09/29/13 1156 09/29/13 1708 09/29/13 2016 09/30/13 0735  GLUCAP 96 141* 95 124* 88    Recent  Results (from the past 240 hour(s))  URINE CULTURE     Status: None   Collection Time    09/28/13 11:53 AM      Result Value Ref Range Status   Specimen Description URINE, CLEAN CATCH   Final   Special Requests NONE   Final   Culture  Setup Time     Final   Value: 09/28/2013 14:51     Performed at [facility]   Colony Count     Final   Value: >=100,000 COLONIES/ML     Performed at [facility]   Culture     Final   Value: ESCHERICHIA COLI     Performed at [facility]   Report Status PENDING   Incomplete     Studies: Dg Hip Complete Left  09/28/2013   CLINICAL DATA:  Two episodes of falling over the last several days now with left hip pain  EXAM: LEFT HIP - COMPLETE 2+ VIEW  COMPARISON:  Left hip series of September 14, 2013  FINDINGS: The bones are osteopenic. There is mild symmetric narrowing of the hip joints. There is soft tissue calcification adjacent to the greater trochanter on the left which is stable. There is no acute fracture.  IMPRESSION: There is no acute bony abnormality of the left hip. There is mild symmetric narrowing of both hips.   Electronically Signed   By: David  [place]   On: 09/28/2013 12:14    Scheduled Meds: . amLODipine  10 mg Oral Daily  . aspirin  81 mg Oral QHS  . brimonidine  1 drop Left Eye BID  . calcium carbonate  2 tablet Oral BID  . ciprofloxacin  200 mg Intravenous Q12H  . feeding supplement (ENSURE COMPLETE)  237 mL Oral Q24H  . fluticasone  1 spray Each Nare Daily  . heparin  5,000 Units Subcutaneous 3 times per day  . insulin aspart  0-15 Units Subcutaneous TID WC  . levothyroxine  100 mcg Oral QAC breakfast  . multivitamin with minerals  1 tablet Oral Daily  . pantoprazole  40 mg Oral Daily  . polyvinyl alcohol  1 drop Both Eyes BID  . potassium chloride  10 mEq Oral TID  . vitamin C  1,000 mg Oral Daily  . vitamin E  1,000 Units Oral Daily   Continuous Infusions: . sodium chloride 75 mL/hr at 09/30/13 [phone]

## 2013-10-01 LAB — GLUCOSE, CAPILLARY
GLUCOSE-CAPILLARY: 76 mg/dL (ref 70–99)
Glucose-Capillary: 151 mg/dL — ABNORMAL HIGH (ref 70–99)

## 2013-10-01 MED ORDER — POTASSIUM CHLORIDE ER 10 MEQ PO TBCR: 10.0000 meq | EXTENDED_RELEASE_TABLET | Freq: Every day | ORAL | Status: DC

## 2013-10-01 MED ORDER — ENSURE COMPLETE PO LIQD: 237.0000 mL | ORAL | Status: DC

## 2013-10-01 MED ORDER — ACETAMINOPHEN 325 MG PO TABS: 650.0000 mg | ORAL_TABLET | Freq: Four times a day (QID) | ORAL | Status: DC | PRN

## 2013-10-01 MED ORDER — TRAMADOL-ACETAMINOPHEN 37.5-325 MG PO TABS: 1.0000 | ORAL_TABLET | Freq: Four times a day (QID) | ORAL | Status: DC | PRN

## 2013-10-01 MED ORDER — CIPROFLOXACIN HCL 500 MG PO TABS: 500.0000 mg | ORAL_TABLET | Freq: Two times a day (BID) | ORAL | Status: DC

## 2013-10-01 MED ORDER — CYCLOBENZAPRINE HCL 5 MG PO TABS: 5.0000 mg | ORAL_TABLET | Freq: Every evening | ORAL | Status: DC | PRN

## 2013-10-01 NOTE — Discharge Instructions (Signed)

## 2013-10-01 NOTE — Progress Notes (Signed)
Nursing Discharge Summary  Patient ID: [name] MRN: [id number] DOB/AGE: [birth year] 80 y.o.  Admit date: 09/28/2013 Discharge date: 10/01/2013  Discharged Condition: good  Disposition: 06-Home-Health Care Svc  Follow-up Information   Schedule an appointment as soon as possible for a visit with Illene RegulusMichael Norins, MD. (As needed, Follow up appt after recent hospitalization)    Specialty:  Internal Medicine      Prescriptions Given: Packet to be sent to [facility]. Report called to nurse receiving patient at [facility].   Means of Discharge: Patient will be transported to facility via [facility]. Daughter at bedside.   Signed: Gloriajean DellBaldwin, Shamel Germond Danielle 10/01/2013, 2:36 PM

## 2013-10-01 NOTE — Discharge Summary (Signed)
Physician Discharge Summary  Desiree [name] RUE:[id number] DOB: 06-30-1920 DOA: 09/28/2013  PCP: Illene Regulus, MD  Admit date: 09/28/2013 Discharge date: 10/01/2013  Recommendations for Outpatient Follow-up:  1. continue ciprofloxacin for 4 more days on discharge for treatment of urinary tract infection. 2. please continue to hold losartan and hydrochlorothiazide. Patient was on Norvasc 10 mg daily and blood pressure has been stable with this medication only, blood pressure 129/54 prior to discharge. Also, patient had mild renal insufficiency on admission which could be because of losartan and HCTZ. Creatinine is within normal limits prior to discharge. If blood pressure continues to go up it would be okay to restart HCTZ or losartan for better blood pressure control.  Discharge Diagnoses:  Principal Problem:   UTI (lower urinary tract infection) Active Problems:   HYPOTHYROIDISM   DIABETES MELLITUS, TYPE II   ANEMIA   HYPERTENSION   GERD   OSTEOARTHRITIS   Acute kidney injury   Dehydration   UTI (urinary tract infection)   Malnutrition of moderate degree    Discharge Condition: stable   Diet recommendation: as tolerated   History of present illness:  80 year old female with past medical history of hypertension, hypothyroidism, mild dementia, osteoarthritis of the hip managed with Cortizone injection by Dr. Bernardo Heater as outpatient who presented to [facility] ED 09/28/2013 with poor PO intake, weakness, falls, and suprapubic pain. She was found to have UTI on admission and was started on Cipro. Her initial left hip x ray did not reveal acute fracture.   Assessment/Plan:   Principal Problem:  Failure to thrive / dehydration / weakness  Secondary to combination of mild dementia, osteoarthritis, UTI  Continue supplemental calcium  Treatment for UTI with Cipro  Active Problems:  E.Coli UTI  Sensitive to ciprofloxacin which patient will continue for 4 more days of discharge. This will  complete a total of one week treatment with antibiotics. HYPOTHYROIDISM  Continue synthroid 100 mcg daily  DIABETES MELLITUS, TYPE II  Stable. May use sliding scale but CBGs were under good control in hospital. Acute renal failure  Secondary to UTI and dehydration  Creatinine WNL this am  HYPERTENSION  Continue Norvasc 10 mg daily  Hold off on HCTZ and losartan Anemia  Likely iron deficiency  No signs of bleeding  Hemoglobin is 9.4; no current indication for transfusion  Malnutrition of moderate degree  Pt meets criteria for moderate malnutrition in the context of acute illness as evidenced by 5% body weight loss in one month, PO intake <75% for one month, moderate muscle wasting and subcutaneous fat loss .   DVT prophylaxis: heparin subQ while pt is in hospital    Code Status: DNR/DNI  Family Communication: plan of care discussed with the patient and her daughter at the bedside   Consultants:  None  Procedures:  None  Antibiotics:  Cipro 09/28/2013 --> will continue for 4 more days on discharge   Signed:  Manson Passey, MD  [facility] 10/01/2013, 9:41 AM  Pager #: [phone]   Discharge Exam: Filed Vitals:   10/01/13 0455  BP: 129/54  Pulse: 77  Temp: 98.1 F (36.7 C)  Resp: 16   Filed Vitals:   09/30/13 0442 09/30/13 1403 09/30/13 2136 10/01/13 0455  BP: 123/50 131/51 138/55 129/54  Pulse: 80 96 89 77  Temp: 97.6 F (36.4 C) 98.3 F (36.8 C) 97.9 F (36.6 C) 98.1 F (36.7 C)  TempSrc: Oral  Oral Oral  Resp: 16 16 16 16   Height:  Weight:      SpO2: 98% 100% 100% 100%    General: Pt is alert, follows commands appropriately, not in acute distress Cardiovascular: Regular rate and rhythm, S1/S2 appreciated Respiratory: Bilateral air entry, no wheezing or crackles Abdominal: Soft, non tender, non distended, bowel sounds +, no guarding Extremities: no edema, no cyanosis, pulses palpable bilaterally DP and PT Neuro: Grossly  nonfocal  Discharge Instructions  Discharge Instructions   Call MD for:  difficulty breathing, headache or visual disturbances    Complete by:  As directed      Call MD for:  persistant dizziness or light-headedness    Complete by:  As directed      Call MD for:  persistant nausea and vomiting    Complete by:  As directed      Call MD for:  severe uncontrolled pain    Complete by:  As directed      Diet - low sodium heart healthy    Complete by:  As directed      Discharge instructions    Complete by:  As directed   1. continue ciprofloxacin for 4 more days on discharge for treatment of urinary tract infection. 2. please continue to hold losartan and hydrochlorothiazide. Patient was on Norvasc 10 mg daily and blood pressure has been stable with this medication only, blood pressure 129/54 prior to discharge. Also, patient had mild renal insufficiency on admission which could be because of losartan and HCTZ. Creatinine is within normal limits prior to discharge. If blood pressure continues to go up it would be okay to restart HCTZ or losartan for better blood pressure control.     Increase activity slowly    Complete by:  As directed             Medication List    STOP taking these medications       hydrochlorothiazide 50 MG tablet  Commonly known as:  HYDRODIURIL     losartan 100 MG tablet  Commonly known as:  COZAAR     oxyCODONE 5 MG immediate release tablet  Commonly known as:  Oxy IR/ROXICODONE      TAKE these medications       acetaminophen 325 MG tablet  Commonly known as:  TYLENOL  Take 2 tablets (650 mg total) by mouth every 6 (six) hours as needed for mild pain (or Fever >/= 101).     amLODipine 10 MG tablet  Commonly known as:  NORVASC  Take 1 tablet (10 mg total) by mouth daily with breakfast.     aspirin 81 MG tablet  Take 81 mg by mouth at bedtime.     brimonidine 0.15 % ophthalmic solution  Commonly known as:  ALPHAGAN  Place 1 drop into the left eye  2 (two) times daily.     ciprofloxacin 500 MG tablet  Commonly known as:  CIPRO  Take 1 tablet (500 mg total) by mouth 2 (two) times daily.     cyclobenzaprine 5 MG tablet  Commonly known as:  FLEXERIL  Take 1-2 tablets (5-10 mg total) by mouth at bedtime as needed for muscle spasms. Take 1-2 tabs at bedtime as needed     diclofenac 75 MG EC tablet  Commonly known as:  VOLTAREN  Take 1 tablet by mouth 2 (two) times daily.     feeding supplement (ENSURE COMPLETE) Liqd  Take 237 mLs by mouth daily.     fluticasone 50 MCG/ACT nasal spray  Commonly known as:  FLONASE  Place 1 spray into both nostrils daily.     levothyroxine 100 MCG tablet  Commonly known as:  SYNTHROID, LEVOTHROID  Take 1 tablet (100 mcg total) by mouth daily before breakfast.     loratadine 10 MG tablet  Commonly known as:  CLARITIN  Take 10 mg by mouth daily as needed for allergies.     multivitamin per tablet  Take 1 tablet by mouth daily.     naproxen sodium 220 MG tablet  Commonly known as:  ANAPROX  Take 220 mg by mouth daily as needed (pain). For pain     omeprazole 40 MG capsule  Commonly known as:  PRILOSEC  Take 40 mg by mouth daily with lunch.     potassium chloride 10 MEQ tablet  Commonly known as:  KLOR-CON 10  Take 1 tablet (10 mEq total) by mouth daily.     SYSTANE 0.4-0.3 % Soln  Generic drug:  Polyethyl Glycol-Propyl Glycol  Place 1 drop into both eyes 2 (two) times daily.     traMADol-acetaminophen 37.5-325 MG per tablet  Commonly known as:  ULTRACET  Take 1 tablet by mouth every 6 (six) hours as needed for moderate pain.     TUMS 500 MG chewable tablet  Generic drug:  calcium carbonate  Chew 2 tablets by mouth 2 (two) times daily. Takes 1 tablet at lunch and 1 tablet at dinner     vitamin C 500 MG tablet  Commonly known as:  ASCORBIC ACID  Take 1,000 mg by mouth daily.     vitamin E 1000 UNIT capsule  Generic drug:  vitamin E  Take 1,000 Units by mouth daily.            Follow-up Information   Schedule an appointment as soon as possible for a visit with Illene Regulus, MD. (As needed, Follow up appt after recent hospitalization)    Specialty:  Internal Medicine       The results of significant diagnostics from this hospitalization (including imaging, microbiology, ancillary and laboratory) are listed below for reference.    Significant Diagnostic Studies: Dg Hip Complete Left  09/28/2013   CLINICAL DATA:  Two episodes of falling over the last several days now with left hip pain  EXAM: LEFT HIP - COMPLETE 2+ VIEW  COMPARISON:  Left hip series of September 14, 2013  FINDINGS: The bones are osteopenic. There is mild symmetric narrowing of the hip joints. There is soft tissue calcification adjacent to the greater trochanter on the left which is stable. There is no acute fracture.  IMPRESSION: There is no acute bony abnormality of the left hip. There is mild symmetric narrowing of both hips.   Electronically Signed   By: David  [place]   On: 09/28/2013 12:14   Dg Hip Complete Left  09/15/2013   CLINICAL DATA:  Two-month history of severe left hip pain  EXAM: LEFT HIP - COMPLETE 2+ VIEW  COMPARISON:  None.  FINDINGS: The bony pelvis is adequately mineralized for age. There is narrowing of the hip joint spaces bilaterally, greater on the right than on the left. There are mild degenerative changes of the femoral head on the left, but it remains smoothly rounded. The AP and frog-leg lateral views of the left hip reveal no acute bony abnormalities. There are vascular calcifications within the medial soft tissues of the thigh. There are calcifications adjacent to the greater trochanter.  IMPRESSION: There is no acute bony abnormality of the left hip but there is  moderate degenerative change.   Electronically Signed   By: David  [place]   On: 09/15/2013 08:51    Microbiology: Recent Results (from the past 240 hour(s))  URINE CULTURE     Status: None   Collection Time    09/28/13  11:53 AM      Result Value Ref Range Status   Specimen Description URINE, CLEAN CATCH   Final   Special Requests NONE   Final   Culture  Setup Time     Final   Value: 09/28/2013 14:51     Performed at [facility]   Colony Count     Final   Value: >=100,000 COLONIES/ML     Performed at [facility]   Culture     Final   Value: ESCHERICHIA COLI     Performed at [facility]   Report Status 09/30/2013 FINAL   Final   Organism ID, Bacteria ESCHERICHIA COLI   Final     Labs: Basic Metabolic Panel:  Recent Labs Lab 09/28/13 1153 09/29/13 0408  NA 137 136*  K 5.1 5.1  CL 103 104  CO2 21 22  GLUCOSE 132* 97  BUN 32* 23  CREATININE 1.14* 1.03  CALCIUM 8.6 8.2*   Liver Function Tests:  Recent Labs Lab 09/28/13 1153  AST 17  ALT 15  ALKPHOS 216*  BILITOT <0.2*  PROT 6.7  ALBUMIN 2.7*   No results found for this basename: LIPASE, AMYLASE,  in the last 168 hours No results found for this basename: AMMONIA,  in the last 168 hours CBC:  Recent Labs Lab 09/28/13 1153 09/29/13 0408  WBC 15.2* 12.8*  NEUTROABS 12.2*  --   HGB 11.0* 9.4*  HCT 33.9* 29.4*  MCV 81.7 82.8  PLT 438* 379   Cardiac Enzymes: No results found for this basename: CKTOTAL, CKMB, CKMBINDEX, TROPONINI,  in the last 168 hours BNP: BNP (last 3 results) No results found for this basename: PROBNP,  in the last 8760 hours CBG:  Recent Labs Lab 09/30/13 0735 09/30/13 1204 09/30/13 1707 09/30/13 2106 10/01/13 0743  GLUCAP 88 180* 100* 118* 76    Time coordinating discharge: Over 30 minutes

## 2013-10-01 NOTE — Progress Notes (Signed)
Pt for discharge [facility].  CSW facilitated pt discharge needs including contacting facility, faxing pt discharge information via [facility], discussing with pt and pt daughter, [name] at bedside, providing RN phone number to call to report, confirming facility received insurance authorization through [employer], and arranging ambulance transport via [facility].   No further social work needs identified at this time.  CSW signing off.   Loletta SpecterSuzanna Kidd, MSW, LCSW Clinical Social Work [phone]

## 2013-10-16 ENCOUNTER — Telehealth: Payer: Self-pay | Admitting: Internal Medicine

## 2013-10-16 NOTE — Telephone Encounter (Signed)
Burna MortimerWanda called from [facility] asking to see if Dr. Alwyn RenHopper will be Desiree [name] PCP and sigh/follow up on her home health services. Pt's daughter told them that she spoke with Dr. Alwyn RenHopper on 09/14/13 if Dr. Alwyn RenHopper agree to be Desiree [name] PCP and the answer is yes (need to verify this?). Please advise and if this is okey, do pt need an appt?

## 2013-10-16 NOTE — Telephone Encounter (Signed)
Unfortunately I am transitioning to retirement from primary care. I shall be teaching Nurse Practitioner Students from  [facility] [facility] [employer], and [employer]. You should transfer your primary care to one of the new physicians joining [facility]  in the next 3 months to guarantee continuity of care .

## 2013-10-19 NOTE — Telephone Encounter (Signed)
Caller name: Johni Relation to ZO:XWRUEAVWpt:daughter Call back number:  [phone]   Reason for call:   Daughter is calling this office now needing a PCP for her mother.  She was a former patient of Dr. Debby BudNorins and Dr. Alwyn RenHopper is not going to accept her as a patient since he is retiring.  Daughter transferred to Dr. Beverely Lowabori from Dr. Alwyn RenHopper.  Daughter wants to know can we get her mother in soon for a new patient apt to get these home service orders signed.  Please advise./

## 2013-10-19 NOTE — Telephone Encounter (Signed)
I would be happy to accept pt but I know that I don't have any openings in the near future.  Our PA- Ramon Dredgedward- has openings in his panel and has geriatric experience.  I would recommend she see Ramon Dredgedward as her provider as this would be a good fit.

## 2013-10-20 ENCOUNTER — Encounter: Payer: Self-pay | Admitting: Medical

## 2013-10-20 ENCOUNTER — Telehealth: Payer: Self-pay

## 2013-10-20 ENCOUNTER — Ambulatory Visit (INDEPENDENT_AMBULATORY_CARE_PROVIDER_SITE_OTHER): Payer: Medicare Other | Admitting: Medical

## 2013-10-20 VITALS — BP 111/66 | HR 90 | Temp 97.8°F | Wt 118.0 lb

## 2013-10-20 DIAGNOSIS — R269 Unspecified abnormalities of gait and mobility: Secondary | ICD-10-CM | POA: Insufficient documentation

## 2013-10-20 DIAGNOSIS — R5383 Other fatigue: Secondary | ICD-10-CM

## 2013-10-20 DIAGNOSIS — Z87440 Personal history of urinary (tract) infections: Secondary | ICD-10-CM

## 2013-10-20 DIAGNOSIS — R5381 Other malaise: Secondary | ICD-10-CM

## 2013-10-20 DIAGNOSIS — E119 Type 2 diabetes mellitus without complications: Secondary | ICD-10-CM

## 2013-10-20 DIAGNOSIS — I10 Essential (primary) hypertension: Secondary | ICD-10-CM

## 2013-10-20 LAB — COMPREHENSIVE METABOLIC PANEL
ALT: 17 U/L (ref 0–35)
AST: 23 U/L (ref 0–37)
Albumin: 3.3 g/dL — ABNORMAL LOW (ref 3.5–5.2)
Alkaline Phosphatase: 149 U/L — ABNORMAL HIGH (ref 39–117)
BUN: 15 mg/dL (ref 6–23)
CO2: 20 meq/L (ref 19–32)
CREATININE: 1 mg/dL (ref 0.4–1.2)
Calcium: 9.1 mg/dL (ref 8.4–10.5)
Chloride: 103 mEq/L (ref 96–112)
GFR: 56.56 mL/min — AB (ref >60.00)
GLUCOSE: 97 mg/dL (ref 70–99)
Potassium: 4.6 mEq/L (ref 3.5–5.1)
Sodium: 137 mEq/L (ref 135–145)
TOTAL PROTEIN: 7.2 g/dL (ref 6.0–8.3)
Total Bilirubin: 0.4 mg/dL (ref 0.2–1.2)

## 2013-10-20 LAB — CBC WITH DIFFERENTIAL/PLATELET
BASOS PCT: 0.6 % (ref 0.0–3.0)
Basophils Absolute: 0.1 10*3/uL (ref 0.0–0.1)
Eosinophils Absolute: 0.5 10*3/uL (ref 0.0–0.7)
Eosinophils Relative: 6.1 % — ABNORMAL HIGH (ref 0.0–5.0)
HCT: 28.4 % — ABNORMAL LOW (ref 36.0–46.0)
Hemoglobin: 9.1 g/dL — ABNORMAL LOW (ref 12.0–15.0)
LYMPHS PCT: 19.5 % (ref 12.0–46.0)
Lymphs Abs: 1.7 10*3/uL (ref 0.7–4.0)
MCHC: 31.9 g/dL (ref 30.0–36.0)
MCV: 80.1 fl (ref 78.0–100.0)
Monocytes Absolute: 0.7 10*3/uL (ref 0.1–1.0)
Monocytes Relative: 7.8 % (ref 3.0–12.0)
Neutro Abs: 5.8 10*3/uL (ref 1.4–7.7)
Neutrophils Relative %: 66 % (ref 43.0–77.0)
PLATELETS: 503 10*3/uL — AB (ref 150.0–400.0)
RBC: 3.54 Mil/uL — AB (ref 3.87–5.11)
RDW: 16.8 % — AB (ref 11.5–15.5)
WBC: 8.8 10*3/uL (ref 4.0–10.5)

## 2013-10-20 LAB — POCT URINALYSIS DIPSTICK
Bilirubin, UA: NEGATIVE
GLUCOSE UA: NEGATIVE
Ketones, UA: NEGATIVE
Nitrite, UA: NEGATIVE
Spec Grav, UA: 1.015
Urobilinogen, UA: 0.2
pH, UA: 7.5

## 2013-10-20 LAB — HEMOGLOBIN A1C: HEMOGLOBIN A1C: 6.4 % (ref 4.6–6.5)

## 2013-10-20 NOTE — Progress Notes (Signed)
Subjective:    Patient ID: Desiree [name], female    DOB: 10-Jun-1920, 80 y.o.   MRN: [id number]  HPI  Pt is in our office for first time. Pt was seen recently for urinary tract infection for about 1 month. She was initially confused and week. Daughter took her to ED. She was admitted with urinary infection and dehydration. She was in hospital for about a week. Then in rehab for 10 days. Pt daughter wants him to have therapy. Her prior pcp would not sign papers for physical and occupation therapy. Prior to hospitalization she could walk by herself. Since hospitalization no fever, no dysuria, no confusion. Eating ok. No nausea or vomiting. Pt does live by herself. Daughter has someone that watches her from 8 am- 4:30 pm.   Htn- Her bp is stable. Diabetes- since hospitalization. Pt bs increased since. But no hyperglycemic signs or symptoms. Also history of hypothyroid.   Past Medical History  Diagnosis Date  . Dementia   . Diabetes mellitus     type  II- diet controlled  . Hyperlipidemia   . Hypertension   . Osteoarthritis     hands  . Allergic rhinitis   . Anemia   . GERD (gastroesophageal reflux disease)   . Hypothyroidism   . Urinary incontinence     stress  . Blood transfusion   . Osteoporosis   . Rectal prolapse   . Vision loss of right eye   . Glaucoma     bilateral  . Macular degeneration   . Hard of hearing   . PONV (postoperative nausea and vomiting)     History   Social History  . Marital Status: Widowed    Spouse Name: N/A    Number of Children: N/A  . Years of Education: N/A   Occupational History  . homemaker    Social History Main Topics  . Smoking status: Former Smoker    Quit date: 09/28/1973  . Smokeless tobacco: Never Used  . Alcohol Use: No  . Drug Use: No  . Sexual Activity: No   Other Topics Concern  . Not on file   Social History Narrative   married 30 years -divorced; remarried 6778 -widowed '83 . 1 daughter 1 son. 1 grandchild, 2  great grandchildren. 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 lives alone - IADLs except driving.    Terminal care issues: patient clearly states, daughter present, that she would not want heroic or futile care, e.g. CPR, mechanical vent. support, etc. DNR/DNI, no HEROICs    Past Surgical History  Procedure Laterality Date  . Carpal tunnel release      bilateral  . Cataract extraction  2006    left  . Abdominal hysterectomy      nonmalignant reasons  . Vein ligation and stripping      left leg  . Right eye surgery  7-00  . Lumbar fusion  3-01    spinal cord leak- requiring 2 month admission    Family History  Problem Relation Age of Onset  . Other Mother     CVA/ grief  . Heart disease Mother   . Heart attack Father   . Heart disease Father   . Heart attack Brother 47  . Cancer Neg Hx     breast or colon    Allergies  Allergen Reactions  . Ceftriaxone Other (See Comments)    Bioxon makes her skin crawl  . Lactose Intolerance (Gi) Other (See Comments)  Gi upset  . Rofecoxib Other (See Comments)    Vioxx caused gi issues  . Sulfur Hives    Current Outpatient Prescriptions on File Prior to Visit  Medication Sig Dispense Refill  . amLODipine (NORVASC) 10 MG tablet Take 1 tablet (10 mg total) by mouth daily with breakfast.  90 tablet  3  . aspirin 81 MG tablet Take 81 mg by mouth at bedtime.       . brimonidine (ALPHAGAN) 0.15 % ophthalmic solution Place 1 drop into the left eye 2 (two) times daily.      . calcium carbonate (TUMS) 500 MG chewable tablet Chew 2 tablets by mouth 2 (two) times daily. Takes 1 tablet at lunch and 1 tablet at dinner      . feeding supplement, ENSURE COMPLETE, (ENSURE COMPLETE) LIQD Take 237 mLs by mouth daily.  10 Bottle  0  . fluticasone (FLONASE) 50 MCG/ACT nasal spray Place 1 spray into both nostrils daily.  48 g  3  . levothyroxine (SYNTHROID, LEVOTHROID) 100 MCG tablet Take 1 tablet (100 mcg total) by mouth daily before  breakfast.  90 tablet  3  . loratadine (CLARITIN) 10 MG tablet Take 10 mg by mouth daily as needed for allergies.      . multivitamin (THERAGRAN) per tablet Take 1 tablet by mouth daily.       . naproxen sodium (ANAPROX) 220 MG tablet Take 220 mg by mouth daily as needed (pain). For pain      . omeprazole (PRILOSEC) 40 MG capsule Take 40 mg by mouth daily with lunch.       Bertram Gala Glycol-Propyl Glycol (SYSTANE) 0.4-0.3 % SOLN Place 1 drop into both eyes 2 (two) times daily.      . potassium chloride (KLOR-CON 10) 10 MEQ tablet Take 1 tablet (10 mEq total) by mouth daily.  270 tablet  3  . vitamin C (ASCORBIC ACID) 500 MG tablet Take 1,000 mg by mouth daily.      . vitamin E (VITAMIN E) 1000 UNIT capsule Take 1,000 Units by mouth daily.       Marland Kitchen acetaminophen (TYLENOL) 325 MG tablet Take 2 tablets (650 mg total) by mouth every 6 (six) hours as needed for mild pain (or Fever >/= 101).  30 tablet  0  . ciprofloxacin (CIPRO) 500 MG tablet Take 1 tablet (500 mg total) by mouth 2 (two) times daily.  8 tablet  0  . cyclobenzaprine (FLEXERIL) 5 MG tablet Take 1-2 tablets (5-10 mg total) by mouth at bedtime as needed for muscle spasms. Take 1-2 tabs at bedtime as needed  30 tablet  0  . diclofenac (VOLTAREN) 75 MG EC tablet Take 1 tablet by mouth 2 (two) times daily.      . traMADol-acetaminophen (ULTRACET) 37.5-325 MG per tablet Take 1 tablet by mouth every 6 (six) hours as needed for moderate pain.  30 tablet  0   No current facility-administered medications on file prior to visit.    BP 111/66  Pulse 90  Temp(Src) 97.8 F (36.6 C)  Wt 118 lb (53.524 kg)  SpO2 99%     Review of Systems  Constitutional: Positive for fatigue. Negative for fever and chills.       Still some fatigued not feeling back to baseline yet after hospitalization.  HENT: Negative.   Respiratory: Negative for choking, chest tightness, shortness of breath and wheezing.   Cardiovascular: Negative for chest pain,  palpitations and leg swelling.  Gastrointestinal: Negative  for nausea, vomiting, abdominal pain, diarrhea, constipation, blood in stool, abdominal distention and anal bleeding.  Endocrine: Negative for polydipsia, polyphagia and polyuria.  Genitourinary: Negative for dysuria, urgency, frequency, flank pain, enuresis, difficulty urinating and pelvic pain.  Neurological: Negative for dizziness, tremors, seizures, syncope, facial asymmetry, speech difficulty, weakness, numbness and headaches.  Hematological: Negative for adenopathy. Does not bruise/bleed easily.  Psychiatric/Behavioral: Negative for hallucinations, behavioral problems, confusion, sleep disturbance, dysphoric mood and agitation. The patient is not nervous/anxious.        Objective:   Physical Exam  Constitutional: She is oriented to person, place, and time. No distress.  Frail elderly pt in wheel chair.  HENT:  Head: Normocephalic and atraumatic.  Eyes: Conjunctivae and EOM are normal. Pupils are equal, round, and reactive to light.  Neck: Normal range of motion. Neck supple.  Cardiovascular: Normal rate and regular rhythm.  Exam reveals no gallop and no friction rub.   Pulmonary/Chest: Effort normal and breath sounds normal. No respiratory distress. She has no wheezes. She has no rales. She exhibits no tenderness.  Abdominal: Soft. Bowel sounds are normal. She exhibits no distension and no mass. There is no tenderness. There is no rebound and no guarding.  Musculoskeletal:  Some kyphosis. Weak and difficult standing.  Neurological: She is alert and oriented to person, place, and time. No cranial nerve deficit. Coordination normal.  CN III-XII grossly intact.  Skin: Skin is warm. No rash noted. She is not diaphoretic.  Psychiatric: She has a normal mood and affect. Her behavior is normal. Judgment and thought content normal.          Assessment & Plan:

## 2013-10-20 NOTE — Assessment & Plan Note (Signed)
bp stable 

## 2013-10-20 NOTE — Progress Notes (Signed)
Pre visit review using our clinic review tool, if applicable. No additional management support is needed unless otherwise documented below in the visit note. 

## 2013-10-20 NOTE — Assessment & Plan Note (Signed)
No obvious symptoms and not suspicous by urine dip. Will do culture do to her history of just recent and coming on very slowly with asymptomatic presentation.

## 2013-10-20 NOTE — Patient Instructions (Addendum)
For your deconditioned state and gait disturbance post hospitalization we are checking for urinary tract infection today(Culture sent out)and doing some labs as well. [facility] Follow up in 2 wks or as needed.  Will follow labs and pt will continue her chronic medication regimen.

## 2013-10-20 NOTE — Telephone Encounter (Signed)
Caller name:Cindy Relation to pt: [facility] Call back [phone] Pharmacy:  Reason for call: Arline AspCindy called and said that Ms Jimmye NormanFarmer had called her and said that Ramon Dredgedward had a lot of new orders and specific ways he wanted things done, so she was calling to inquire about new orders and she left fax numbers for them to be faxed to [phone]

## 2013-10-20 NOTE — Assessment & Plan Note (Signed)
Will refer to PT. That order was placed. I did explained to her with her age. Hip fx is concern so did have discussion with pt and daughter about nursing home if it appears too risky to be at home.

## 2013-10-20 NOTE — Assessment & Plan Note (Addendum)
Will get a1-c today and see if increasing as was during hospitalization.

## 2013-10-21 NOTE — Telephone Encounter (Signed)
Reviewed and our office is going to call patient and inform them that referral is already made.

## 2013-10-21 NOTE — Telephone Encounter (Signed)
Spoke with daughter and [facility] and they will contact as soon as it is complete.  Notified [facility].

## 2013-10-21 NOTE — Telephone Encounter (Signed)
[facility] back to say they had not received the orders yet for OT and PT. When we fax this over they would also like for us to fax the office notes. She also stated that Ms Desiree NormanFarmer was wanting these services at home and not at a facility.

## 2013-10-22 LAB — URINE CULTURE
Colony Count: NO GROWTH
Organism ID, Bacteria: NO GROWTH

## 2013-10-27 NOTE — Telephone Encounter (Signed)
°  Caller name: Magda Paganiniudrey  Relation to GN:FAOZHpt:child  Call back number: [phone] Magda Paganiniudrey    Reason for call: pt daughter Magda Paganiniudrey would like to know the status of the in home therapy orders. She would like a call back as soon as possible from Dahlia ClientEdward S.

## 2013-10-27 NOTE — Telephone Encounter (Signed)
Spoke with pt's daughter to notified that the referral coordinator accidentally sent the orders to the wrong facility pts daughter was upset that it happened and wanted to make sure that the [facility] [phone]. Advised pt to call us back if she doesn't hear [facility] tomorrow.

## 2013-11-04 ENCOUNTER — Telehealth: Payer: Self-pay | Admitting: *Deleted

## 2013-11-04 NOTE — Telephone Encounter (Signed)
Received Client Medication-Drug Interaction Report paperwork via fax from [facility].  Placed in folder for Dr. Laury Axon to review.//AB/CMA

## 2013-12-18 ENCOUNTER — Telehealth: Payer: Self-pay | Admitting: Family Medicine

## 2013-12-18 NOTE — Telephone Encounter (Signed)
°  Caller name: Mel AlmondJada Relation to pt: other / [facility]  Call back number: [phone]   Reason for call:   [facility]  Re-faxed orders need to be signed and fax to  # [phone]

## 2013-12-21 ENCOUNTER — Telehealth: Payer: Self-pay | Admitting: *Deleted

## 2013-12-21 DIAGNOSIS — Z7689 Persons encountering health services in other specified circumstances: Secondary | ICD-10-CM

## 2013-12-21 NOTE — Telephone Encounter (Signed)
Received home health certification forms via fax [facility]. Forwarded forms to Saks IncorporatedCody Martin, PA-C in [place]'s absence. JG//CMA

## 2013-12-22 NOTE — Telephone Encounter (Signed)
[facility] JG//CMA

## 2013-12-22 NOTE — Telephone Encounter (Signed)
Forms received on 12/21/13, reviewed/signed by Malva Coganody Martin, PA-C (in Edward's absence) and [facility]. JG//CMA

## 2014-04-19 ENCOUNTER — Encounter: Payer: Self-pay | Admitting: Medical

## 2014-04-19 ENCOUNTER — Ambulatory Visit (INDEPENDENT_AMBULATORY_CARE_PROVIDER_SITE_OTHER): Payer: PPO | Admitting: Medical

## 2014-04-19 ENCOUNTER — Other Ambulatory Visit: Payer: Self-pay

## 2014-04-19 VITALS — BP 149/71 | HR 84 | Temp 98.3°F | Wt 115.6 lb

## 2014-04-19 DIAGNOSIS — I10 Essential (primary) hypertension: Secondary | ICD-10-CM

## 2014-04-19 DIAGNOSIS — Z0000 Encounter for general adult medical examination without abnormal findings: Secondary | ICD-10-CM

## 2014-04-19 DIAGNOSIS — E785 Hyperlipidemia, unspecified: Secondary | ICD-10-CM

## 2014-04-19 LAB — CBC WITH DIFFERENTIAL/PLATELET
Basophils Absolute: 0 10*3/uL (ref 0.0–0.1)
Basophils Relative: 0.4 % (ref 0.0–3.0)
EOS PCT: 2 % (ref 0.0–5.0)
Eosinophils Absolute: 0.1 10*3/uL (ref 0.0–0.7)
HEMATOCRIT: 33.2 % — AB (ref 36.0–46.0)
Hemoglobin: 11 g/dL — ABNORMAL LOW (ref 12.0–15.0)
LYMPHS ABS: 1.4 10*3/uL (ref 0.7–4.0)
Lymphocytes Relative: 20.1 % (ref 12.0–46.0)
MCHC: 33.1 g/dL (ref 30.0–36.0)
MCV: 84.6 fl (ref 78.0–100.0)
MONO ABS: 0.2 10*3/uL (ref 0.1–1.0)
Monocytes Relative: 3.3 % (ref 3.0–12.0)
NEUTROS PCT: 74.2 % (ref 43.0–77.0)
Neutro Abs: 5.1 10*3/uL (ref 1.4–7.7)
PLATELETS: 245 10*3/uL (ref 150.0–400.0)
RBC: 3.92 Mil/uL (ref 3.87–5.11)
RDW: 16.5 % — AB (ref 11.5–15.5)
WBC: 6.9 10*3/uL (ref 4.0–10.5)

## 2014-04-19 LAB — POCT URINALYSIS DIPSTICK
BILIRUBIN UA: NEGATIVE
Blood, UA: NEGATIVE
GLUCOSE UA: NEGATIVE
KETONES UA: NEGATIVE
Nitrite, UA: NEGATIVE
PH UA: 6
SPEC GRAV UA: 1.015
Urobilinogen, UA: 0.2

## 2014-04-19 LAB — COMPREHENSIVE METABOLIC PANEL
ALT: 13 U/L (ref 0–35)
AST: 20 U/L (ref 0–37)
Albumin: 4.1 g/dL (ref 3.5–5.2)
Alkaline Phosphatase: 112 U/L (ref 39–117)
BUN: 27 mg/dL — AB (ref 6–23)
CALCIUM: 9.6 mg/dL (ref 8.4–10.5)
CO2: 26 mEq/L (ref 19–32)
CREATININE: 0.9 mg/dL (ref 0.40–1.20)
Chloride: 105 mEq/L (ref 96–112)
GFR: 62.33 mL/min (ref >60.00)
Glucose, Bld: 104 mg/dL — ABNORMAL HIGH (ref 70–99)
Potassium: 3.9 mEq/L (ref 3.5–5.1)
SODIUM: 139 meq/L (ref 135–145)
Total Bilirubin: 0.5 mg/dL (ref 0.2–1.2)
Total Protein: 7.6 g/dL (ref 6.0–8.3)

## 2014-04-19 LAB — LIPID PANEL
CHOLESTEROL: 125 mg/dL (ref 0–200)
HDL: 49.8 mg/dL (ref >39.00)
LDL Cholesterol: 65 mg/dL (ref 0–99)
NONHDL: 75.2
TRIGLYCERIDES: 52 mg/dL (ref 0.0–149.0)
Total CHOL/HDL Ratio: 3
VLDL: 10.4 mg/dL (ref 0.0–40.0)

## 2014-04-19 LAB — TSH: TSH: 3.79 u[IU]/mL (ref 0.35–4.50)

## 2014-04-19 MED ORDER — LOSARTAN POTASSIUM 100 MG PO TABS: 100.0000 mg | ORAL_TABLET | Freq: Every day | ORAL | Status: DC

## 2014-04-19 MED ORDER — FLUTICASONE PROPIONATE 50 MCG/ACT NA SUSP: 1.0000 | Freq: Every day | NASAL | Status: DC

## 2014-04-19 MED ORDER — LEVOTHYROXINE SODIUM 100 MCG PO TABS: 100.0000 ug | ORAL_TABLET | Freq: Every day | ORAL | Status: DC

## 2014-04-19 MED ORDER — BRIMONIDINE TARTRATE 0.15 % OP SOLN: 1.0000 [drp] | Freq: Two times a day (BID) | OPHTHALMIC | Status: DC

## 2014-04-19 MED ORDER — OMEPRAZOLE 40 MG PO CPDR: 40.0000 mg | DELAYED_RELEASE_CAPSULE | Freq: Every day | ORAL | Status: DC

## 2014-04-19 MED ORDER — POTASSIUM CHLORIDE ER 10 MEQ PO TBCR: 10.0000 meq | EXTENDED_RELEASE_TABLET | Freq: Every day | ORAL | Status: DC

## 2014-04-19 MED ORDER — HYDROCHLOROTHIAZIDE 50 MG PO TABS: 50.0000 mg | ORAL_TABLET | Freq: Every day | ORAL | Status: DC

## 2014-04-19 MED ORDER — AMLODIPINE BESYLATE 10 MG PO TABS: 10.0000 mg | ORAL_TABLET | Freq: Every day | ORAL | Status: DC

## 2014-04-19 NOTE — Progress Notes (Signed)
Pt here for annual wellness exam.  Last mammogram-Last mammogram in 2005 per our record. Order placed but not in chart. Last pap-old than 81 yo.I don't see resut in Chart. Last colonoscopy-(50-75yo)- 12 yrs ago. Normal result. Osteoporosis- Last Dexascan-I don't see any recent bone density. Cholesterol will do today BMI yearly.-See vital in chart  Lung Cancer- Ct of chest yearly starting at age 36 if you smoke or quit smoking last 15 years. Vaccine status-pneumonia vaccine completed. Fluvaccine done this year. I For ros, pe see smart set  Subjective:    Desiree [name] is a 81 y.o. female who presents for Medicare Annual/Subsequent preventive examination.  Preventive Screening-Counseling & Management  Tobacco History  Smoking status  . Former Smoker  . Quit date: 09/28/1973  Smokeless tobacco  . Never Used     Problems Prior to Visit 1. None reported.  Current Problems (verified) Patient Active Problem List   Diagnosis Date Noted  . History of urinary tract infection 10/20/2013  . Gait disturbance 10/20/2013  . Malnutrition of moderate degree 09/29/2013  . UTI (lower urinary tract infection) 09/28/2013  . Acute kidney injury 09/28/2013  . Dehydration 09/28/2013  . UTI (urinary tract infection) 09/28/2013  . Rectal procidentia / prolapse 09/05/2011  . Routine health maintenance 06/15/2011  . GERD 03/24/2007  . URINARY INCONTINENCE 03/24/2007  . DIABETES MELLITUS, TYPE II 07/23/2006  . HYPERLIPIDEMIA 07/23/2006  . DEMENTIA 07/23/2006  . HYPERTENSION 07/23/2006  . Allergic Rhinitis, Cause Unspecified 07/23/2006  . OSTEOARTHRITIS 07/23/2006  . HYPOTHYROIDISM 07/15/2006  . ANEMIA 07/15/2006  . CATARACTS 07/15/2006    Medications Prior to Visit Current Outpatient Prescriptions on File Prior to Visit  Medication Sig Dispense Refill  . acetaminophen (TYLENOL) 325 MG tablet Take 2 tablets (650 mg total) by mouth every 6 (six) hours as needed for mild pain (or Fever  >/= 101). 30 tablet 0  . amLODipine (NORVASC) 10 MG tablet Take 1 tablet (10 mg total) by mouth daily with breakfast. 90 tablet 3  . aspirin 81 MG tablet Take 81 mg by mouth at bedtime.     . brimonidine (ALPHAGAN) 0.15 % ophthalmic solution Place 1 drop into the left eye 2 (two) times daily.    . calcium carbonate (TUMS) 500 MG chewable tablet Chew 2 tablets by mouth 2 (two) times daily. Takes 1 tablet at lunch and 1 tablet at dinner    . feeding supplement, ENSURE COMPLETE, (ENSURE COMPLETE) LIQD Take 237 mLs by mouth daily. 10 Bottle 0  . fluticasone (FLONASE) 50 MCG/ACT nasal spray Place 1 spray into both nostrils daily. 48 g 3  . levothyroxine (SYNTHROID, LEVOTHROID) 100 MCG tablet Take 1 tablet (100 mcg total) by mouth daily before breakfast. 90 tablet 3  . loratadine (CLARITIN) 10 MG tablet Take 10 mg by mouth daily as needed for allergies.    . multivitamin (THERAGRAN) per tablet Take 1 tablet by mouth daily.     Marland Kitchen omeprazole (PRILOSEC) 40 MG capsule Take 40 mg by mouth daily with lunch.     Bertram Gala Glycol-Propyl Glycol (SYSTANE) 0.4-0.3 % SOLN Place 1 drop into both eyes 2 (two) times daily.    . potassium chloride (KLOR-CON 10) 10 MEQ tablet Take 1 tablet (10 mEq total) by mouth daily. 270 tablet 3  . vitamin C (ASCORBIC ACID) 500 MG tablet Take 1,000 mg by mouth daily.    . vitamin E (VITAMIN E) 1000 UNIT capsule Take 1,000 Units by mouth daily.  No current facility-administered medications on file prior to visit.    Current Medications (verified) Current Outpatient Prescriptions  Medication Sig Dispense Refill  . hydrochlorothiazide (HYDRODIURIL) 50 MG tablet Take 50 mg by mouth daily.    Marland Kitchen. losartan (COZAAR) 100 MG tablet Take 100 mg by mouth daily.    Marland Kitchen. acetaminophen (TYLENOL) 325 MG tablet Take 2 tablets (650 mg total) by mouth every 6 (six) hours as needed for mild pain (or Fever >/= 101). 30 tablet 0  . amLODipine (NORVASC) 10 MG tablet Take 1 tablet (10 mg total) by  mouth daily with breakfast. 90 tablet 3  . aspirin 81 MG tablet Take 81 mg by mouth at bedtime.     . brimonidine (ALPHAGAN) 0.15 % ophthalmic solution Place 1 drop into the left eye 2 (two) times daily.    . calcium carbonate (TUMS) 500 MG chewable tablet Chew 2 tablets by mouth 2 (two) times daily. Takes 1 tablet at lunch and 1 tablet at dinner    . feeding supplement, ENSURE COMPLETE, (ENSURE COMPLETE) LIQD Take 237 mLs by mouth daily. 10 Bottle 0  . fluticasone (FLONASE) 50 MCG/ACT nasal spray Place 1 spray into both nostrils daily. 48 g 3  . levothyroxine (SYNTHROID, LEVOTHROID) 100 MCG tablet Take 1 tablet (100 mcg total) by mouth daily before breakfast. 90 tablet 3  . loratadine (CLARITIN) 10 MG tablet Take 10 mg by mouth daily as needed for allergies.    . multivitamin (THERAGRAN) per tablet Take 1 tablet by mouth daily.     Marland Kitchen. omeprazole (PRILOSEC) 40 MG capsule Take 40 mg by mouth daily with lunch.     Bertram Gala. Polyethyl Glycol-Propyl Glycol (SYSTANE) 0.4-0.3 % SOLN Place 1 drop into both eyes 2 (two) times daily.    . potassium chloride (KLOR-CON 10) 10 MEQ tablet Take 1 tablet (10 mEq total) by mouth daily. 270 tablet 3  . vitamin C (ASCORBIC ACID) 500 MG tablet Take 1,000 mg by mouth daily.    . vitamin E (VITAMIN E) 1000 UNIT capsule Take 1,000 Units by mouth daily.      No current facility-administered medications for this visit.     Allergies (verified) Ceftriaxone; Lactose intolerance (gi); Rofecoxib; and Sulfur   PAST HISTORY  Family History Family History  Problem Relation Age of Onset  . Other Mother     CVA/ grief  . Heart disease Mother   . Heart attack Father   . Heart disease Father   . Heart attack Brother 47  . Cancer Neg Hx     breast or colon    Social History History  Substance Use Topics  . Smoking status: Former Smoker    Quit date: 09/28/1973  . Smokeless tobacco: Never Used  . Alcohol Use: No     Are there smokers in your home (other than you)?  No  Risk Factors Current exercise habits: Very little due to age.  Dietary issues discussed: Lactose intolerante.  Cardiac risk factors: advanced age (older than 7555 for men, 6765 for women), diabetes mellitus, dyslipidemia, family history of premature cardiovascular disease and hypertension.  Depression Screen (Note: if answer to either of the following is "Yes", a more complete depression screening is indicated)   Over the past two weeks, have you felt down, depressed or hopeless? No  Over the past two weeks, have you felt little interest or pleasure in doing things? Yes  Have you lost interest or pleasure in daily life? No  Do you often  feel hopeless? No  Do you cry easily over simple problems? No  Activities of Daily Living In your present state of health, do you have any difficulty performing the following activities?:  Driving? No Managing money?  No Feeding yourself? Yes Getting from bed to chair? Yes Climbing a flight of stairs? Yes. But family does not allow her to climb steps by herself. Preparing food and eating?: Yes. But meals on wheels helps. Bathing or showering? Yes. Handicap shower with hand wand. Getting dressed: Yes Getting to the toilet? Yes Using the toilet:Yes Moving around from place to place: Yes In the past year have you fallen or had a near fall?:Yes. Occurred around severe UTI.   Are you sexually active?  No  Do you have more than one partner?  No  Hearing Difficulties: Yes Do you often ask people to speak up or repeat themselves? Yes. One of her hearing aid not working. Appointment tomorrow Do you experience ringing or noises in your ears? No Do you have difficulty understanding soft or whispered voices? Yes   Do you feel that you have a problem with memory? Yes  Do you often misplace items? Yes  Do you feel safe at home?  Yes  Cognitive Testing  Alert? Yes  Normal Appearance?Yes  Oriented to person? Yes  Place? Yes   Time? Yes  Recall of three  objects?  3.(yes)  Can perform simple calculations? No  Displays appropriate judgment?Yes  Can read the correct time from a watch face?Yes   Advanced Directives have been discussed with the patient? Yes  List the Names of Other Physician/Practitioners you currently use: 1.  Dr. Michelene Heady opthalmologist. Dr. Honor Junes  Indicate any recent Medical Services you may have received from other than [facility] providers in the past year (date may be approximate).  Immunization History  Administered Date(s) Administered  . Influenza Whole 01/10/2006  . Influenza, High Dose Seasonal PF 12/18/2012  . Pneumococcal Conjugate-13 02/18/2013  . Pneumococcal Polysaccharide-23 12/10/2011  . Td 03/13/1991    Screening Tests Health Maintenance  Topic Date Due  . OPHTHALMOLOGY EXAM  12/06/1932  . URINE MICROALBUMIN  12/06/1932  . ZOSTAVAX  12/07/1982  . DEXA SCAN  12/07/1987  . TETANUS/TDAP  03/12/2001  . FOOT EXAM  04/15/2009  . COLONOSCOPY  06/12/2011  . INFLUENZA VACCINE  10/10/2013  . HEMOGLOBIN A1C  04/22/2014  . PNEUMOCOCCAL POLYSACCHARIDE VACCINE AGE 49 AND OVER  Completed    All answers were reviewed with the patient and necessary referrals were made:  Esperanza Richters, Cordelia Poche   04/19/2014   History reviewed:  She  has a past medical history of Dementia; Diabetes mellitus; Hyperlipidemia; Hypertension; Osteoarthritis; Allergic rhinitis; Anemia; GERD (gastroesophageal reflux disease); Hypothyroidism; Urinary incontinence; Blood transfusion; Osteoporosis; Rectal prolapse; Vision loss of right eye; Glaucoma; Macular degeneration; Hard of hearing; and PONV (postoperative nausea and vomiting). She  does not have any pertinent problems on file. She  has past surgical history that includes Carpal tunnel release; Cataract extraction (2006); Abdominal hysterectomy; Vein ligation and stripping; right eye surgery (7-00); and Lumbar fusion (3-01). Her family history includes Heart attack in her father; Heart  attack (age of onset: 52) in her brother; Heart disease in her father and mother; Other in her mother. There is no history of Cancer. She  reports that she quit smoking about 40 years ago. She has never used smokeless tobacco. She reports that she does not drink alcohol or use illicit drugs. She has a current medication list  which includes the following prescription(s): hydrochlorothiazide, losartan, acetaminophen, amlodipine, aspirin, brimonidine, calcium carbonate, feeding supplement (ensure complete), fluticasone, levothyroxine, loratadine, multivitamin, omeprazole, polyethyl glycol-propyl glycol, potassium chloride, vitamin c, and vitamin e. Current Outpatient Prescriptions on File Prior to Visit  Medication Sig Dispense Refill  . acetaminophen (TYLENOL) 325 MG tablet Take 2 tablets (650 mg total) by mouth every 6 (six) hours as needed for mild pain (or Fever >/= 101). 30 tablet 0  . amLODipine (NORVASC) 10 MG tablet Take 1 tablet (10 mg total) by mouth daily with breakfast. 90 tablet 3  . aspirin 81 MG tablet Take 81 mg by mouth at bedtime.     . brimonidine (ALPHAGAN) 0.15 % ophthalmic solution Place 1 drop into the left eye 2 (two) times daily.    . calcium carbonate (TUMS) 500 MG chewable tablet Chew 2 tablets by mouth 2 (two) times daily. Takes 1 tablet at lunch and 1 tablet at dinner    . feeding supplement, ENSURE COMPLETE, (ENSURE COMPLETE) LIQD Take 237 mLs by mouth daily. 10 Bottle 0  . fluticasone (FLONASE) 50 MCG/ACT nasal spray Place 1 spray into both nostrils daily. 48 g 3  . levothyroxine (SYNTHROID, LEVOTHROID) 100 MCG tablet Take 1 tablet (100 mcg total) by mouth daily before breakfast. 90 tablet 3  . loratadine (CLARITIN) 10 MG tablet Take 10 mg by mouth daily as needed for allergies.    . multivitamin (THERAGRAN) per tablet Take 1 tablet by mouth daily.     Marland Kitchen omeprazole (PRILOSEC) 40 MG capsule Take 40 mg by mouth daily with lunch.     Bertram Gala Glycol-Propyl Glycol (SYSTANE)  0.4-0.3 % SOLN Place 1 drop into both eyes 2 (two) times daily.    . potassium chloride (KLOR-CON 10) 10 MEQ tablet Take 1 tablet (10 mEq total) by mouth daily. 270 tablet 3  . vitamin C (ASCORBIC ACID) 500 MG tablet Take 1,000 mg by mouth daily.    . vitamin E (VITAMIN E) 1000 UNIT capsule Take 1,000 Units by mouth daily.      No current facility-administered medications on file prior to visit.   She is allergic to ceftriaxone; lactose intolerance (gi); rofecoxib; and sulfur.  Review of Systems A comprehensive review of systems was negative.   Objective:   Vision by Snellen chart: right ZOX:[id number] declines measurement, left [id number] measurement.  Body mass index is 20.48 kg/(m^2). BP 149/71 mmHg  Pulse 84  Temp(Src) 98.3 F (36.8 C) (Oral)  Wt 115 lb 9.6 oz (52.436 kg)  SpO2 98%  General   Mental Status- Alert.  Orientation-Oriented x3. Build and Nutrition Well Nourished and Well Developed.  Skin General: Normal.  Color- Normal color. Moisture- Dry.Temperature warm. Lesions: No suspicious lesions  Head, Eyes, Ears, Nose, Thoat Ears-Normal. Auditory Canal-Bilateral-Normal. Tympanic Membrane- Bilateral-Normal. Eyes Fundi- Bilateral-Normal. Pupil- Bilateral- Direct reaction to light normal. Nose & Sinuses- Normal. Nostril- Bilateral-Normal.  Neck Neck- No Bruits or Masses. Thyroid- Normal. No thyromegaly or nodules.  Breast Pt declined.  Chest and Lung Exam  Percussion: Quality and Intensity:-Percussion normal. Percussion of chest reveals- No Dullness. Palpation of the chest reveals- Non-tender. Auscultation: Breath sounds-Normal. Adventitious  Sounds:No adventitious   Vaginal Declined/deffered. Cardiovascular Inspection: No Heaves. Auscultation: Heart Sounds- Normal sinus rhythm without murmur or gallop, S1 WNL and S2 WNL.  Abdomen Inspection:- Inspection Normal. Inspection of abdomen reveals- No Hernias. Palpation/Percussion: Palpation and  Percussion of the abdomen reveal- Non Tender and No Palpable masses. Liver: Other Characteristics- No Hepatmegaly Spleen:Other Characteristics- No Splenomegaly.  Auscultation: Auscultation of the abdomen reveals-Bowel sounds normal and No Abdominal bruits.    Rectal Declined/deferred.  Neurologic Mental Status- Normal Cranial Nerves- Normal Bilaterally, Motor- Normal. Strength:5/5 normal muscle strength- All Muscles. General Assessment of Reflexes- Right Knee- 2+. Left Knee- 2+. Coordination- Normal. Gait- Normal. Meningeal Signs- None.  Musculoskeletal Global Assessment General- Joints show full range of motion without obvious deformity and Normal muscle mass. Strength 5/5 in upper and lower extremities.  Lymphatic General lymphatics Description-No Generalized lymphadenopathy.      Assessment:    This is a routine wellness  examination for this patient . I reviewed all health maintenance protocols including mammography, colonoscopy, bone density Needed referrals were placed. Age and diagnosis  appropriate screening labs were ordered. Her immunization history was reviewed and appropriate vaccinations were ordered. Her current medications and allergies were reviewed and needed refills of her chronic medications were ordered. The plan for yearly health maintenance was discussed all orders and referrals were made as appropriate.   Plan:    During the course of the visit the patient was educated and counseled about appropriate screening and preventive services including:    Pt and daughter declined mammog, colonsocpy and pap not indicated. Up to date on her vaccienes.   Will refill her chronic meds thorugh mail order. Will get cbc, cmp, tsh, lipid panel today.      Diet review for nutrition referral? Yes x____  Not Indicated ____   Patient Instructions (the written plan) was given to the patient.  Medicare Attestation I have personally reviewed: The patient's medical and  social history Their use of alcohol, tobacco or illicit drugs Their current medications and supplements The patient's functional ability including ADLs,fall risks, home safety risks, cognitive, and hearing and visual impairment Diet and physical activities Evidence for depression or mood disorders  The patient's weight, height, BMI, and visual acuity have been recorded in the chart.  I have made referrals, counseling, and provided education to the patient based on review of the above and I have provided the patient with a written personalized care plan for preventive services.     Esperanza Richters, PA-C   04/19/2014

## 2014-04-19 NOTE — Addendum Note (Signed)
Addended by: Lurline HareARTER, Leor Whyte E on: 04/19/2014 02:46 PM   Modules accepted: Orders

## 2014-04-19 NOTE — Progress Notes (Signed)
Pre visit review using our clinic review tool, if applicable. No additional management support is needed unless otherwise documented below in the visit note. 

## 2014-04-19 NOTE — Patient Instructions (Addendum)
Please get cbc, cmp, tsh, and lipid panel today. Also sending out urine culture.  We will send your prescription to [facility].(will give to lpn to fill)  Preventive Care for Adults A healthy lifestyle and preventive care can promote health and wellness. Preventive health guidelines for women include the following key practices.  A routine yearly physical is a good way to check with your health care provider about your health and preventive screening. It is a chance to share any concerns and updates on your health and to receive a thorough exam.  Visit your dentist for a routine exam and preventive care every 6 months. Brush your teeth twice a day and floss once a day. Good oral hygiene prevents tooth decay and gum disease.  The frequency of eye exams is based on your age, health, family medical history, use of contact lenses, and other factors. Follow your health care provider's recommendations for frequency of eye exams.  Eat a healthy diet. Foods like vegetables, fruits, whole grains, low-fat dairy products, and lean protein foods contain the nutrients you need without too many calories. Decrease your intake of foods high in solid fats, added sugars, and salt. Eat the right amount of calories for you.Get information about a proper diet from your health care provider, if necessary.  Regular physical exercise is one of the most important things you can do for your health. Most adults should get at least 150 minutes of moderate-intensity exercise (any activity that increases your heart rate and causes you to sweat) each week. In addition, most adults need muscle-strengthening exercises on 2 or more days a week.  Maintain a healthy weight. The body mass index (BMI) is a screening tool to identify possible weight problems. It provides an estimate of body fat based on height and weight. Your health care provider can find your BMI and can help you achieve or maintain a healthy weight.For adults 20 years  and older:  A BMI below 18.5 is considered underweight.  A BMI of 18.5 to 24.9 is normal.  A BMI of 25 to 29.9 is considered overweight.  A BMI of 30 and above is considered obese.  Maintain normal blood lipids and cholesterol levels by exercising and minimizing your intake of saturated fat. Eat a balanced diet with plenty of fruit and vegetables. Blood tests for lipids and cholesterol should begin at age 41 and be repeated every 5 years. If your lipid or cholesterol levels are high, you are over 50, or you are at high risk for heart disease, you may need your cholesterol levels checked more frequently.Ongoing high lipid and cholesterol levels should be treated with medicines if diet and exercise are not working.  If you smoke, find out from your health care provider how to quit. If you do not use tobacco, do not start.  Lung cancer screening is recommended for adults aged 31-80 years who are at high risk for developing lung cancer because of a history of smoking. A yearly low-dose CT scan of the lungs is recommended for people who have at least a 30-pack-year history of smoking and are a current smoker or have quit within the past 15 years. A pack year of smoking is smoking an average of 1 pack of cigarettes a day for 1 year (for example: 1 pack a day for 30 years or 2 packs a day for 15 years). Yearly screening should continue until the smoker has stopped smoking for at least 15 years. Yearly screening should be stopped for  people who develop a health problem that would prevent them from having lung cancer treatment.  If you are pregnant, do not drink alcohol. If you are breastfeeding, be very cautious about drinking alcohol. If you are not pregnant and choose to drink alcohol, do not have more than 1 drink per day. One drink is considered to be 12 ounces (355 mL) of beer, 5 ounces (148 mL) of wine, or 1.5 ounces (44 mL) of liquor.  Avoid use of street drugs. Do not share needles with anyone.  Ask for help if you need support or instructions about stopping the use of drugs.  High blood pressure causes heart disease and increases the risk of stroke. Your blood pressure should be checked at least every 1 to 2 years. Ongoing high blood pressure should be treated with medicines if weight loss and exercise do not work.  If you are 17-9 years old, ask your health care provider if you should take aspirin to prevent strokes.  Diabetes screening involves taking a blood sample to check your fasting blood sugar level. This should be done once every 3 years, after age 63, if you are within normal weight and without risk factors for diabetes. Testing should be considered at a younger age or be carried out more frequently if you are overweight and have at least 1 risk factor for diabetes.  Breast cancer screening is essential preventive care for women. You should practice "breast self-awareness." This means understanding the normal appearance and feel of your breasts and may include breast self-examination. Any changes detected, no matter how small, should be reported to a health care provider. Women in their 10s and 30s should have a clinical breast exam (CBE) by a health care provider as part of a regular health exam every 1 to 3 years. After age 1, women should have a CBE every year. Starting at age 44, women should consider having a mammogram (breast X-ray test) every year. Women who have a family history of breast cancer should talk to their health care provider about genetic screening. Women at a high risk of breast cancer should talk to their health care providers about having an MRI and a mammogram every year.  Breast cancer gene (BRCA)-related cancer risk assessment is recommended for women who have family members with BRCA-related cancers. BRCA-related cancers include breast, ovarian, tubal, and peritoneal cancers. Having family members with these cancers may be associated with an increased risk  for harmful changes (mutations) in the breast cancer genes BRCA1 and BRCA2. Results of the assessment will determine the need for genetic counseling and BRCA1 and BRCA2 testing.  Routine pelvic exams to screen for cancer are no longer recommended for nonpregnant women who are considered low risk for cancer of the pelvic organs (ovaries, uterus, and vagina) and who do not have symptoms. Ask your health care provider if a screening pelvic exam is right for you.  If you have had past treatment for cervical cancer or a condition that could lead to cancer, you need Pap tests and screening for cancer for at least 20 years after your treatment. If Pap tests have been discontinued, your risk factors (such as having a new sexual partner) need to be reassessed to determine if screening should be resumed. Some women have medical problems that increase the chance of getting cervical cancer. In these cases, your health care provider may recommend more frequent screening and Pap tests.  The HPV test is an additional test that may be used for  cervical cancer screening. The HPV test looks for the virus that can cause the cell changes on the cervix. The cells collected during the Pap test can be tested for HPV. The HPV test could be used to screen women aged 55 years and older, and should be used in women of any age who have unclear Pap test results. After the age of 66, women should have HPV testing at the same frequency as a Pap test.  Colorectal cancer can be detected and often prevented. Most routine colorectal cancer screening begins at the age of 3 years and continues through age 5 years. However, your health care provider may recommend screening at an earlier age if you have risk factors for colon cancer. On a yearly basis, your health care provider may provide home test kits to check for hidden blood in the stool. Use of a small camera at the end of a tube, to directly examine the colon (sigmoidoscopy or  colonoscopy), can detect the earliest forms of colorectal cancer. Talk to your health care provider about this at age 74, when routine screening begins. Direct exam of the colon should be repeated every 5-10 years through age 23 years, unless early forms of pre-cancerous polyps or small growths are found.  People who are at an increased risk for hepatitis B should be screened for this virus. You are considered at high risk for hepatitis B if:  You were born in a country where hepatitis B occurs often. Talk with your health care provider about which countries are considered high risk.  Your parents were born in a high-risk country and you have not received a shot to protect against hepatitis B (hepatitis B vaccine).  You have HIV or AIDS.  You use needles to inject street drugs.  You live with, or have sex with, someone who has hepatitis B.  You get hemodialysis treatment.  You take certain medicines for conditions like cancer, organ transplantation, and autoimmune conditions.  Hepatitis C blood testing is recommended for all people born from [birth year] through 1965 and any individual with known risks for hepatitis C.  Practice safe sex. Use condoms and avoid high-risk sexual practices to reduce the spread of sexually transmitted infections (STIs). STIs include gonorrhea, chlamydia, syphilis, trichomonas, herpes, HPV, and human immunodeficiency virus (HIV). Herpes, HIV, and HPV are viral illnesses that have no cure. They can result in disability, cancer, and death.  You should be screened for sexually transmitted illnesses (STIs) including gonorrhea and chlamydia if:  You are sexually active and are younger than 24 years.  You are older than 24 years and your health care provider tells you that you are at risk for this type of infection.  Your sexual activity has changed since you were last screened and you are at an increased risk for chlamydia or gonorrhea. Ask your health care provider if  you are at risk.  If you are at risk of being infected with HIV, it is recommended that you take a prescription medicine daily to prevent HIV infection. This is called preexposure prophylaxis (PrEP). You are considered at risk if:  You are a heterosexual woman, are sexually active, and are at increased risk for HIV infection.  You take drugs by injection.  You are sexually active with a partner who has HIV.  Talk with your health care provider about whether you are at high risk of being infected with HIV. If you choose to begin PrEP, you should first be tested for HIV. You  should then be tested every 3 months for as long as you are taking PrEP.  Osteoporosis is a disease in which the bones lose minerals and strength with aging. This can result in serious bone fractures or breaks. The risk of osteoporosis can be identified using a bone density scan. Women ages 26 years and over and women at risk for fractures or osteoporosis should discuss screening with their health care providers. Ask your health care provider whether you should take a calcium supplement or vitamin D to reduce the rate of osteoporosis.  Menopause can be associated with physical symptoms and risks. Hormone replacement therapy is available to decrease symptoms and risks. You should talk to your health care provider about whether hormone replacement therapy is right for you.  Use sunscreen. Apply sunscreen liberally and repeatedly throughout the day. You should seek shade when your shadow is shorter than you. Protect yourself by wearing long sleeves, pants, a wide-brimmed hat, and sunglasses year round, whenever you are outdoors.  Once a month, do a whole body skin exam, using a mirror to look at the skin on your back. Tell your health care provider of new moles, moles that have irregular borders, moles that are larger than a pencil eraser, or moles that have changed in shape or color.  Stay current with required vaccines  (immunizations).  Influenza vaccine. All adults should be immunized every year.  Tetanus, diphtheria, and acellular pertussis (Td, Tdap) vaccine. Pregnant women should receive 1 dose of Tdap vaccine during each pregnancy. The dose should be obtained regardless of the length of time since the last dose. Immunization is preferred during the 27th-36th week of gestation. An adult who has not previously received Tdap or who does not know her vaccine status should receive 1 dose of Tdap. This initial dose should be followed by tetanus and diphtheria toxoids (Td) booster doses every 10 years. Adults with an unknown or incomplete history of completing a 3-dose immunization series with Td-containing vaccines should begin or complete a primary immunization series including a Tdap dose. Adults should receive a Td booster every 10 years.  Varicella vaccine. An adult without evidence of immunity to varicella should receive 2 doses or a second dose if she has previously received 1 dose. Pregnant females who do not have evidence of immunity should receive the first dose after pregnancy. This first dose should be obtained before leaving the health care facility. The second dose should be obtained 4-8 weeks after the first dose.  Human papillomavirus (HPV) vaccine. Females aged 13-26 years who have not received the vaccine previously should obtain the 3-dose series. The vaccine is not recommended for use in pregnant females. However, pregnancy testing is not needed before receiving a dose. If a female is found to be pregnant after receiving a dose, no treatment is needed. In that case, the remaining doses should be delayed until after the pregnancy. Immunization is recommended for any person with an immunocompromised condition through the age of 58 years if she did not get any or all doses earlier. During the 3-dose series, the second dose should be obtained 4-8 weeks after the first dose. The third dose should be obtained  24 weeks after the first dose and 16 weeks after the second dose.  Zoster vaccine. One dose is recommended for adults aged 95 years or older unless certain conditions are present.  Measles, mumps, and rubella (MMR) vaccine. Adults born before [birth year] generally are considered immune to measles and mumps. Adults born in  1955 or later should have 1 or more doses of MMR vaccine unless there is a contraindication to the vaccine or there is laboratory evidence of immunity to each of the three diseases. A routine second dose of MMR vaccine should be obtained at least 28 days after the first dose for students attending postsecondary schools, health care workers, or international travelers. People who received inactivated measles vaccine or an unknown type of measles vaccine during 1963-1967 should receive 2 doses of MMR vaccine. People who received inactivated mumps vaccine or an unknown type of mumps vaccine before 1979 and are at high risk for mumps infection should consider immunization with 2 doses of MMR vaccine. For females of childbearing age, rubella immunity should be determined. If there is no evidence of immunity, females who are not pregnant should be vaccinated. If there is no evidence of immunity, females who are pregnant should delay immunization until after pregnancy. Unvaccinated health care workers born before [birth year] who lack laboratory evidence of measles, mumps, or rubella immunity or laboratory confirmation of disease should consider measles and mumps immunization with 2 doses of MMR vaccine or rubella immunization with 1 dose of MMR vaccine.  Pneumococcal 13-valent conjugate (PCV13) vaccine. When indicated, a person who is uncertain of her immunization history and has no record of immunization should receive the PCV13 vaccine. An adult aged 74 years or older who has certain medical conditions and has not been previously immunized should receive 1 dose of PCV13 vaccine. This PCV13 should be followed  with a dose of pneumococcal polysaccharide (PPSV23) vaccine. The PPSV23 vaccine dose should be obtained at least 8 weeks after the dose of PCV13 vaccine. An adult aged 36 years or older who has certain medical conditions and previously received 1 or more doses of PPSV23 vaccine should receive 1 dose of PCV13. The PCV13 vaccine dose should be obtained 1 or more years after the last PPSV23 vaccine dose.  Pneumococcal polysaccharide (PPSV23) vaccine. When PCV13 is also indicated, PCV13 should be obtained first. All adults aged 73 years and older should be immunized. An adult younger than age 27 years who has certain medical conditions should be immunized. Any person who resides in a nursing home or long-term care facility should be immunized. An adult smoker should be immunized. People with an immunocompromised condition and certain other conditions should receive both PCV13 and PPSV23 vaccines. People with human immunodeficiency virus (HIV) infection should be immunized as soon as possible after diagnosis. Immunization during chemotherapy or radiation therapy should be avoided. Routine use of PPSV23 vaccine is not recommended for American Indians, [place] Natives, or people younger than 65 years unless there are medical conditions that require PPSV23 vaccine. When indicated, people who have unknown immunization and have no record of immunization should receive PPSV23 vaccine. One-time revaccination 5 years after the first dose of PPSV23 is recommended for people aged 19-64 years who have chronic kidney failure, nephrotic syndrome, asplenia, or immunocompromised conditions. People who received 1-2 doses of PPSV23 before age 79 years should receive another dose of PPSV23 vaccine at age 63 years or later if at least 5 years have passed since the previous dose. Doses of PPSV23 are not needed for people immunized with PPSV23 at or after age 1 years.  Meningococcal vaccine. Adults with asplenia or persistent complement  component deficiencies should receive 2 doses of quadrivalent meningococcal conjugate (MenACWY-D) vaccine. The doses should be obtained at least 2 months apart. Microbiologists working with certain meningococcal bacteria, [employer] recruits, people at risk  during an outbreak, and people who travel to or live in countries with a high rate of meningitis should be immunized. A first-year college student up through age 62 years who is living in a residence hall should receive a dose if she did not receive a dose on or after her 16th birthday. Adults who have certain high-risk conditions should receive one or more doses of vaccine.  Hepatitis A vaccine. Adults who wish to be protected from this disease, have certain high-risk conditions, work with hepatitis A-infected animals, work in hepatitis A research labs, or travel to or work in countries with a high rate of hepatitis A should be immunized. Adults who were previously unvaccinated and who anticipate close contact with an international adoptee during the first 60 days after arrival in the [place] from a country with a high rate of hepatitis A should be immunized.  Hepatitis B vaccine. Adults who wish to be protected from this disease, have certain high-risk conditions, may be exposed to blood or other infectious body fluids, are household contacts or sex partners of hepatitis B positive people, are clients or workers in certain care facilities, or travel to or work in countries with a high rate of hepatitis B should be immunized.  Haemophilus influenzae type b (Hib) vaccine. A previously unvaccinated person with asplenia or sickle cell disease or having a scheduled splenectomy should receive 1 dose of Hib vaccine. Regardless of previous immunization, a recipient of a hematopoietic stem cell transplant should receive a 3-dose series 6-12 months after her successful transplant. Hib vaccine is not recommended for adults with HIV infection. Preventive  Services / Frequency Ages 39 to 80 years  Blood pressure check.** / Every 1 to 2 years.  Lipid and cholesterol check.** / Every 5 years beginning at age 46.  Clinical breast exam.** / Every 3 years for women in their 75s and 75s.  BRCA-related cancer risk assessment.** / For women who have family members with a BRCA-related cancer (breast, ovarian, tubal, or peritoneal cancers).  Pap test.** / Every 2 years from ages 8 through 18. Every 3 years starting at age 73 through age 3 or 76 with a history of 3 consecutive normal Pap tests.  HPV screening.** / Every 3 years from ages 16 through ages 69 to 78 with a history of 3 consecutive normal Pap tests.  Hepatitis C blood test.** / For any individual with known risks for hepatitis C.  Skin self-exam. / Monthly.  Influenza vaccine. / Every year.  Tetanus, diphtheria, and acellular pertussis (Tdap, Td) vaccine.** / Consult your health care provider. Pregnant women should receive 1 dose of Tdap vaccine during each pregnancy. 1 dose of Td every 10 years.  Varicella vaccine.** / Consult your health care provider. Pregnant females who do not have evidence of immunity should receive the first dose after pregnancy.  HPV vaccine. / 3 doses over 6 months, if 55 and younger. The vaccine is not recommended for use in pregnant females. However, pregnancy testing is not needed before receiving a dose.  Measles, mumps, rubella (MMR) vaccine.** / You need at least 1 dose of MMR if you were born in 1957 or later. You may also need a 2nd dose. For females of childbearing age, rubella immunity should be determined. If there is no evidence of immunity, females who are not pregnant should be vaccinated. If there is no evidence of immunity, females who are pregnant should delay immunization until after pregnancy.  Pneumococcal 13-valent conjugate (PCV13) vaccine.** /  Consult your health care provider.  Pneumococcal polysaccharide (PPSV23) vaccine.** / 1 to 2  doses if you smoke cigarettes or if you have certain conditions.  Meningococcal vaccine.** / 1 dose if you are age 42 to 56 years and a Market researcher living in a residence hall, or have one of several medical conditions, you need to get vaccinated against meningococcal disease. You may also need additional booster doses.  Hepatitis A vaccine.** / Consult your health care provider.  Hepatitis B vaccine.** / Consult your health care provider.  Haemophilus influenzae type b (Hib) vaccine.** / Consult your health care provider. Ages 84 to 82 years  Blood pressure check.** / Every 1 to 2 years.  Lipid and cholesterol check.** / Every 5 years beginning at age 39 years.  Lung cancer screening. / Every year if you are aged 30-80 years and have a 30-pack-year history of smoking and currently smoke or have quit within the past 15 years. Yearly screening is stopped once you have quit smoking for at least 15 years or develop a health problem that would prevent you from having lung cancer treatment.  Clinical breast exam.** / Every year after age 35 years.  BRCA-related cancer risk assessment.** / For women who have family members with a BRCA-related cancer (breast, ovarian, tubal, or peritoneal cancers).  Mammogram.** / Every year beginning at age 26 years and continuing for as long as you are in good health. Consult with your health care provider.  Pap test.** / Every 3 years starting at age 43 years through age 34 or 84 years with a history of 3 consecutive normal Pap tests.  HPV screening.** / Every 3 years from ages 61 years through ages 37 to 7 years with a history of 3 consecutive normal Pap tests.  Fecal occult blood test (FOBT) of stool. / Every year beginning at age 35 years and continuing until age 37 years. You may not need to do this test if you get a colonoscopy every 10 years.  Flexible sigmoidoscopy or colonoscopy.** / Every 5 years for a flexible sigmoidoscopy or every  10 years for a colonoscopy beginning at age 14 years and continuing until age 33 years.  Hepatitis C blood test.** / For all people born from [birth year] through 1963 and any individual with known risks for hepatitis C.  Skin self-exam. / Monthly.  Influenza vaccine. / Every year.  Tetanus, diphtheria, and acellular pertussis (Tdap/Td) vaccine.** / Consult your health care provider. Pregnant women should receive 1 dose of Tdap vaccine during each pregnancy. 1 dose of Td every 10 years.  Varicella vaccine.** / Consult your health care provider. Pregnant females who do not have evidence of immunity should receive the first dose after pregnancy.  Zoster vaccine.** / 1 dose for adults aged 76 years or older.  Measles, mumps, rubella (MMR) vaccine.** / You need at least 1 dose of MMR if you were born in 1955 or later. You may also need a 2nd dose. For females of childbearing age, rubella immunity should be determined. If there is no evidence of immunity, females who are not pregnant should be vaccinated. If there is no evidence of immunity, females who are pregnant should delay immunization until after pregnancy.  Pneumococcal 13-valent conjugate (PCV13) vaccine.** / Consult your health care provider.  Pneumococcal polysaccharide (PPSV23) vaccine.** / 1 to 2 doses if you smoke cigarettes or if you have certain conditions.  Meningococcal vaccine.** / Consult your health care provider.  Hepatitis A vaccine.** / Consult  your health care provider.  Hepatitis B vaccine.** / Consult your health care provider.  Haemophilus influenzae type b (Hib) vaccine.** / Consult your health care provider. Ages 71 years and over  Blood pressure check.** / Every 1 to 2 years.  Lipid and cholesterol check.** / Every 5 years beginning at age 31 years.  Lung cancer screening. / Every year if you are aged 26-80 years and have a 30-pack-year history of smoking and currently smoke or have quit within the past 15 years.  Yearly screening is stopped once you have quit smoking for at least 15 years or develop a health problem that would prevent you from having lung cancer treatment.  Clinical breast exam.** / Every year after age 47 years.  BRCA-related cancer risk assessment.** / For women who have family members with a BRCA-related cancer (breast, ovarian, tubal, or peritoneal cancers).  Mammogram.** / Every year beginning at age 57 years and continuing for as long as you are in good health. Consult with your health care provider.  Pap test.** / Every 3 years starting at age 78 years through age 75 or 40 years with 3 consecutive normal Pap tests. Testing can be stopped between 65 and 70 years with 3 consecutive normal Pap tests and no abnormal Pap or HPV tests in the past 10 years.  HPV screening.** / Every 3 years from ages 57 years through ages 50 or 78 years with a history of 3 consecutive normal Pap tests. Testing can be stopped between 65 and 70 years with 3 consecutive normal Pap tests and no abnormal Pap or HPV tests in the past 10 years.  Fecal occult blood test (FOBT) of stool. / Every year beginning at age 40 years and continuing until age 27 years. You may not need to do this test if you get a colonoscopy every 10 years.  Flexible sigmoidoscopy or colonoscopy.** / Every 5 years for a flexible sigmoidoscopy or every 10 years for a colonoscopy beginning at age 73 years and continuing until age 55 years.  Hepatitis C blood test.** / For all people born from [birth year] through 1963 and any individual with known risks for hepatitis C.  Osteoporosis screening.** / A one-time screening for women ages 65 years and over and women at risk for fractures or osteoporosis.  Skin self-exam. / Monthly.  Influenza vaccine. / Every year.  Tetanus, diphtheria, and acellular pertussis (Tdap/Td) vaccine.** / 1 dose of Td every 10 years.  Varicella vaccine.** / Consult your health care provider.  Zoster vaccine.** / 1  dose for adults aged 62 years or older.  Pneumococcal 13-valent conjugate (PCV13) vaccine.** / Consult your health care provider.  Pneumococcal polysaccharide (PPSV23) vaccine.** / 1 dose for all adults aged 90 years and older.  Meningococcal vaccine.** / Consult your health care provider.  Hepatitis A vaccine.** / Consult your health care provider.  Hepatitis B vaccine.** / Consult your health care provider.  Haemophilus influenzae type b (Hib) vaccine.** / Consult your health care provider. ** Family history and personal history of risk and conditions may change your health care provider's recommendations. Document Released: 04/24/2001 Document Revised: 07/13/2013 Document Reviewed: 07/24/2010 [facility] Patient Information 2015 [place], [place]. This information is not intended to replace advice given to you by your health care provider. Make sure you discuss any questions you have with your health care provider.

## 2014-04-19 NOTE — Assessment & Plan Note (Signed)
Will get cbc, cmp, tsh, lipid panel and ua today. Also culutre due to leukocytes in urine.

## 2014-04-20 ENCOUNTER — Other Ambulatory Visit: Payer: Self-pay | Admitting: Medical

## 2014-04-20 LAB — URINE CULTURE
COLONY COUNT: NO GROWTH
ORGANISM ID, BACTERIA: NO GROWTH

## 2014-04-20 MED ORDER — POTASSIUM CHLORIDE ER 8 MEQ PO TBCR: 10.0000 meq | EXTENDED_RELEASE_TABLET | Freq: Three times a day (TID) | ORAL | Status: DC

## 2014-04-23 ENCOUNTER — Telehealth: Payer: Self-pay | Admitting: Medical

## 2014-04-23 MED ORDER — POTASSIUM CHLORIDE ER 10 MEQ PO TBCR: 10.0000 meq | EXTENDED_RELEASE_TABLET | Freq: Three times a day (TID) | ORAL | Status: DC

## 2014-04-23 NOTE — Telephone Encounter (Signed)
Left message on patients answering machine regarding Potassium sent in 2 week supply.

## 2014-04-23 NOTE — Telephone Encounter (Signed)
Note regarding k refill.

## 2014-04-28 ENCOUNTER — Telehealth: Payer: Self-pay

## 2014-04-28 ENCOUNTER — Other Ambulatory Visit: Payer: Self-pay

## 2014-04-28 MED ORDER — POTASSIUM CHLORIDE ER 10 MEQ PO TBCR: 10.0000 meq | EXTENDED_RELEASE_TABLET | Freq: Three times a day (TID) | ORAL | Status: DC

## 2014-04-28 MED ORDER — BRIMONIDINE TARTRATE 0.15 % OP SOLN: 1.0000 [drp] | Freq: Two times a day (BID) | OPHTHALMIC | Status: DC

## 2014-04-28 MED ORDER — BRIMONIDINE TARTRATE 0.15 % OP SOLN
1.0000 [drp] | Freq: Two times a day (BID) | OPHTHALMIC | Status: DC
Start: 2014-04-28 — End: 2014-04-28

## 2014-04-28 NOTE — Telephone Encounter (Signed)
Patient's daughter called concerning K+. Appears that the mail was only sent in for a 30 day supply.  Daughter: Guinevere Scarletudry McCandless [phone]

## 2014-04-28 NOTE — Telephone Encounter (Signed)
LM for daughter to return the call.

## 2014-04-28 NOTE — Telephone Encounter (Signed)
Spoke with daughter who states that the prescriptions that have been sent in for her mom were sent to mail order  for 30 day supplies and not the 90 days needed. The two medications discussed today were potassium chloride (KLOR-CON 10) 10 MEQ tablet brimonidine (ALPHAGAN) 0.15 % ophthalmic solution I cancelled the 30 day supplies and sent in 90 day supplies while on the phone with Desiree [name].

## 2014-05-21 ENCOUNTER — Telehealth: Payer: Self-pay | Admitting: *Deleted

## 2014-05-21 DIAGNOSIS — H9193 Unspecified hearing loss, bilateral: Secondary | ICD-10-CM

## 2014-05-21 NOTE — Telephone Encounter (Signed)
Pts daughter requesting if  her mother could get a hearing test referral to [facility]Sho[facility]. Please advise.

## 2014-05-22 NOTE — Telephone Encounter (Signed)
ENT referral

## 2014-05-25 ENCOUNTER — Telehealth: Payer: Self-pay | Admitting: Medical

## 2014-05-25 NOTE — Telephone Encounter (Signed)
Left message on daughters answering machine

## 2014-05-25 NOTE — Telephone Encounter (Signed)
Will get lpn to notify family referral has been put in.

## 2014-09-06 ENCOUNTER — Other Ambulatory Visit: Payer: Self-pay

## 2014-09-15 ENCOUNTER — Other Ambulatory Visit: Payer: Self-pay

## 2014-09-15 MED ORDER — FLUTICASONE PROPIONATE 50 MCG/ACT NA SUSP: 1.0000 | Freq: Every day | NASAL | Status: DC

## 2014-11-24 ENCOUNTER — Telehealth: Payer: Self-pay

## 2014-11-24 MED ORDER — LEVOTHYROXINE SODIUM 100 MCG PO TABS: 100.0000 ug | ORAL_TABLET | Freq: Every day | ORAL | Status: DC

## 2014-11-24 MED ORDER — OMEPRAZOLE 40 MG PO CPDR: 40.0000 mg | DELAYED_RELEASE_CAPSULE | Freq: Every day | ORAL | Status: DC

## 2014-11-24 MED ORDER — AMLODIPINE BESYLATE 10 MG PO TABS: 10.0000 mg | ORAL_TABLET | Freq: Every day | ORAL | Status: DC

## 2014-11-24 MED ORDER — POTASSIUM CHLORIDE ER 10 MEQ PO TBCR: 10.0000 meq | EXTENDED_RELEASE_TABLET | Freq: Three times a day (TID) | ORAL | Status: DC

## 2014-11-24 MED ORDER — HYDROCHLOROTHIAZIDE 50 MG PO TABS: 50.0000 mg | ORAL_TABLET | Freq: Every day | ORAL | Status: DC

## 2014-11-24 MED ORDER — LOSARTAN POTASSIUM 100 MG PO TABS: 100.0000 mg | ORAL_TABLET | Freq: Every day | ORAL | Status: DC

## 2014-11-24 NOTE — Telephone Encounter (Signed)
Pt daughter, Magda Paganini, states that pt is not due for yearly until next February. She said it is very difficult to get pt up, dressed, and to the doctor. She is doing fine right now and she doesn't feel f/u is needed. She states this was discussed with Ramon Dredge at appt in February and pt only needed seen yearly. Please advise.

## 2014-11-24 NOTE — Telephone Encounter (Signed)
Edward please advise if pt can wait to be seen until next year.

## 2014-11-24 NOTE — Telephone Encounter (Signed)
I am not sure how they got that she only needs to be seen one time a year. I usually see person at least every 6 months who are 81 yo. Can you see how appointment was scheduled. I don't want to inconvenience her. Maybe talk with family and find convenient time she can come in/whatever they prefer.

## 2014-11-24 NOTE — Telephone Encounter (Signed)
Spoke with daughter and she will see what she can do about the appt.

## 2014-11-24 NOTE — Telephone Encounter (Signed)
Sent in 30 day supply to pharmacy. Pt is past due for follow up. Please call and schedule pt for an appt.

## 2014-12-10 ENCOUNTER — Ambulatory Visit (INDEPENDENT_AMBULATORY_CARE_PROVIDER_SITE_OTHER): Payer: PPO | Admitting: Medical

## 2014-12-10 ENCOUNTER — Encounter: Payer: Self-pay | Admitting: Medical

## 2014-12-10 ENCOUNTER — Telehealth: Payer: Self-pay | Admitting: Medical

## 2014-12-10 VITALS — BP 120/78 | HR 81 | Temp 98.2°F | Resp 17 | Ht 63.0 in | Wt 113.0 lb

## 2014-12-10 DIAGNOSIS — I10 Essential (primary) hypertension: Secondary | ICD-10-CM | POA: Diagnosis not present

## 2014-12-10 DIAGNOSIS — E876 Hypokalemia: Secondary | ICD-10-CM | POA: Diagnosis not present

## 2014-12-10 LAB — COMPREHENSIVE METABOLIC PANEL
ALBUMIN: 4.1 g/dL (ref 3.6–5.1)
ALT: 12 U/L (ref 6–29)
AST: 18 U/L (ref 10–35)
Alkaline Phosphatase: 94 U/L (ref 33–130)
BILIRUBIN TOTAL: 0.4 mg/dL (ref 0.2–1.2)
BUN: 27 mg/dL — ABNORMAL HIGH (ref 7–25)
CALCIUM: 9.6 mg/dL (ref 8.6–10.4)
CHLORIDE: 106 mmol/L (ref 98–110)
CO2: 24 mmol/L (ref 20–31)
Creat: 0.97 mg/dL — ABNORMAL HIGH (ref 0.60–0.88)
GLUCOSE: 107 mg/dL — AB (ref 65–99)
POTASSIUM: 5.9 mmol/L — AB (ref 3.5–5.3)
Sodium: 140 mmol/L (ref 135–146)
Total Protein: 6.9 g/dL (ref 6.1–8.1)

## 2014-12-10 MED ORDER — AMLODIPINE BESYLATE 10 MG PO TABS: 10.0000 mg | ORAL_TABLET | Freq: Every day | ORAL | Status: DC

## 2014-12-10 MED ORDER — LOSARTAN POTASSIUM 100 MG PO TABS: 100.0000 mg | ORAL_TABLET | Freq: Every day | ORAL | Status: DC

## 2014-12-10 MED ORDER — HYDROCHLOROTHIAZIDE 50 MG PO TABS: 50.0000 mg | ORAL_TABLET | Freq: Every day | ORAL | Status: DC

## 2014-12-10 NOTE — Progress Notes (Signed)
Subjective:    Patient ID: Desiree [name], female    DOB: [birth year], 81 y.o.   MRN: [id number]  HPI   Pt in for blood pressure med refill and check up., No cardiac or neurologic signs and symptoms.  Pt has hx of macular degeneration. Pt was told by opthalmologist told to refill through primary office. Dr. Lottie Dawson and Michelene Heady still practicing but they communicate to daughter and pt no  further treatment can be given per family.  Pt and daughter indicate thy will be transferring care to since it is closer to there home.    Review of Systems  Constitutional: Negative for fever, chills, diaphoresis, activity change and fatigue.  Respiratory: Negative for cough, chest tightness and shortness of breath.   Cardiovascular: Negative for chest pain, palpitations and leg swelling.  Gastrointestinal: Negative for nausea, vomiting and abdominal pain.  Musculoskeletal: Negative for neck pain and neck stiffness.  Neurological: Negative for dizziness, tremors, seizures, syncope, facial asymmetry, speech difficulty, weakness, light-headedness, numbness and headaches.  Psychiatric/Behavioral: Negative for behavioral problems, confusion and agitation. The patient is not nervous/anxious.     Past Medical History  Diagnosis Date  . Dementia   . Diabetes mellitus     type  II- diet controlled  . Hyperlipidemia   . Hypertension   . Osteoarthritis     hands  . Allergic rhinitis   . Anemia   . GERD (gastroesophageal reflux disease)   . Hypothyroidism   . Urinary incontinence     stress  . Blood transfusion   . Osteoporosis   . Rectal prolapse   . Vision loss of right eye   . Glaucoma     bilateral  . Macular degeneration   . Hard of hearing   . PONV (postoperative nausea and vomiting)     Social History   Social History  . Marital Status: Widowed    Spouse Name: N/A  . Number of Children: N/A  . Years of Education: N/A   Occupational History  . homemaker    Social History Main  Topics  . Smoking status: Former Smoker    Quit date: 09/28/1973  . Smokeless tobacco: Never Used  . Alcohol Use: No  . Drug Use: No  . Sexual Activity: No   Other Topics Concern  . Not on file   Social History Narrative   married 30 years -divorced; remarried 60 -widowed '83 . 1 daughter 1 son. 1 grandchild, 2 great grandchildren. 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    Terminal care issues: patient clearly states, daughter present, that she would not want heroic or futile care, e.g. CPR, mechanical vent. support, etc. DNR/DNI, no HEROICs    Past Surgical History  Procedure Laterality Date  . Carpal tunnel release      bilateral  . Cataract extraction  2006    left  . Abdominal hysterectomy      nonmalignant reasons  . Vein ligation and stripping      left leg  . Right eye surgery  7-00  . Lumbar fusion  3-01    spinal cord leak- requiring 2 month admission    Family History  Problem Relation Age of Onset  . Other Mother     CVA/ grief  . Heart disease Mother   . Heart attack Father   . Heart disease Father   . Heart attack Brother 47  . Cancer Neg Hx     breast or  colon    Allergies  Allergen Reactions  . Ceftriaxone Other (See Comments)    Bioxon makes her skin crawl  . Lactose Intolerance (Gi) Other (See Comments)    Gi upset  . Rofecoxib Other (See Comments)    Vioxx caused gi issues  . Sulfur Hives    Current Outpatient Prescriptions on File Prior to Visit  Medication Sig Dispense Refill  . acetaminophen (TYLENOL) 325 MG tablet Take 2 tablets (650 mg total) by mouth every 6 (six) hours as needed for mild pain (or Fever >/= 101). 30 tablet 0  . aspirin 81 MG tablet Take 81 mg by mouth at bedtime.     . brimonidine (ALPHAGAN) 0.15 % ophthalmic solution Place 1 drop into the left eye 2 (two) times daily. 45 mL 3  . calcium carbonate (TUMS) 500 MG chewable tablet Chew 2 tablets by mouth 2 (two) times daily.  Takes 1 tablet at lunch and 1 tablet at dinner    . feeding supplement, ENSURE COMPLETE, (ENSURE COMPLETE) LIQD Take 237 mLs by mouth daily. 10 Bottle 0  . fluticasone (FLONASE) 50 MCG/ACT nasal spray Place 1 spray into both nostrils daily. 48 g 1  . levothyroxine (SYNTHROID, LEVOTHROID) 100 MCG tablet Take 1 tablet (100 mcg total) by mouth daily before breakfast. 30 tablet 0  . loratadine (CLARITIN) 10 MG tablet Take 10 mg by mouth daily as needed for allergies.    . multivitamin (THERAGRAN) per tablet Take 1 tablet by mouth daily.     Marland Kitchen omeprazole (PRILOSEC) 40 MG capsule Take 1 capsule (40 mg total) by mouth daily with lunch. 30 capsule 0  . Polyethyl Glycol-Propyl Glycol (SYSTANE) 0.4-0.3 % SOLN Place 1 drop into both eyes 2 (two) times daily.    . potassium chloride (KLOR-CON 10) 10 MEQ tablet Take 1 tablet (10 mEq total) by mouth 3 (three) times daily. 60 tablet 0  . vitamin C (ASCORBIC ACID) 500 MG tablet Take 1,000 mg by mouth daily.    . vitamin E (VITAMIN E) 1000 UNIT capsule Take 1,000 Units by mouth daily.      No current facility-administered medications on file prior to visit.    BP 120/78 mmHg  Pulse 81  Temp(Src) 98.2 F (36.8 C) (Oral)  Resp 17  Ht  (1.6 m)  Wt 113 lb (51.256 kg)  BMI 20.02 kg/m2  SpO2 99%       Objective:   Physical Exam  General Mental Status- Alert. General Appearance- Not in acute distress.   Skin General: Color- Normal Color. Moisture- Normal Moisture.  Neck Carotid Arteries- Normal color. Moisture- Normal Moisture. No carotid bruits. No JVD.  Chest and Lung Exam Auscultation: Breath Sounds:-Normal. CTA.  Cardiovascular Auscultation:Rythm- Regular, Rate and Rhythm. Murmurs & Other Heart Sounds:Auscultation of the heart reveals- No Murmurs.   Neurologic Cranial Nerve exam:- CN III-XII intact(No nystagmus), symmetric smile. Strength:- 5/5 equal and symmetric strength both upper and lower extremities.  Lower ext- no pedal  edema.      Assessment & Plan:  Your blood pressure is well controlled. I am refilling your bp meds today for 6 months. Please get cmp today.  You will follow up with new provider in February. If you have any problems before then would be happy to see you.  Pt family under impression that they don't need labs very often. I explained to them that while taking meds particularly diuretics getting test every 3 month is good idea.

## 2014-12-10 NOTE — Patient Instructions (Signed)
Your blood pressure is well controlled. I am refilling your bp meds today for 6 months. Please get cmp today.  You will follow up with new Romaine Neville in February. If you have any problems before then would be happy to see you.

## 2014-12-10 NOTE — Progress Notes (Signed)
Pre visit review using our clinic review tool, if applicable. No additional management support is needed unless otherwise documented below in the visit note. 

## 2014-12-10 NOTE — Telephone Encounter (Signed)
Opened accidentally 

## 2014-12-12 ENCOUNTER — Telehealth: Payer: Self-pay | Admitting: Medical

## 2014-12-12 DIAGNOSIS — E876 Hypokalemia: Secondary | ICD-10-CM

## 2014-12-12 NOTE — Telephone Encounter (Signed)
Future cmp placed to recheck her k level.

## 2014-12-16 ENCOUNTER — Telehealth: Payer: Self-pay | Admitting: Medical

## 2014-12-16 MED ORDER — OMEPRAZOLE 40 MG PO CPDR: 40.0000 mg | DELAYED_RELEASE_CAPSULE | Freq: Every day | ORAL | Status: DC

## 2014-12-16 MED ORDER — FLUTICASONE PROPIONATE 50 MCG/ACT NA SUSP: 2.0000 | Freq: Every day | NASAL | Status: DC

## 2014-12-16 MED ORDER — LEVOTHYROXINE SODIUM 100 MCG PO TABS: 100.0000 ug | ORAL_TABLET | Freq: Every day | ORAL | Status: DC

## 2014-12-16 MED ORDER — BRIMONIDINE TARTRATE 0.15 % OP SOLN: 1.0000 [drp] | Freq: Two times a day (BID) | OPHTHALMIC | Status: DC

## 2014-12-16 NOTE — Telephone Encounter (Signed)
Left message for pt's daughter to call back if she had any questions and I also sent her a MyChart message as well.

## 2014-12-16 NOTE — Telephone Encounter (Signed)
Pts daughter called. She said that [facility] called notifying her that they received RX for 3 meds. She states all 8 of the pts meds were to be sent in for 90 day supply with 3 refills each. Pharmacy confirmed receiving RXs for amlodipine, hctz, and losartan. She said that we need to send in the others:  Levothyroxine, omeprazole, potassium chloride, flonase, and alphagan  Please send today per her request. They will not ship the other until they have all meds ready for the pt.

## 2014-12-16 NOTE — Telephone Encounter (Signed)
I sent in the other rx. But I did not send it K tab yet(she had elevated k on last lab). She should discontinue that for now. I ask them to repeat k in one week after stopping the k tab. Depending on what level we get would re-write the k probably with differenct instruction and different dose. You can call [facility] and explain that.

## 2014-12-16 NOTE — Telephone Encounter (Signed)
Edward please advise on refill.  

## 2014-12-27 ENCOUNTER — Telehealth: Payer: Self-pay | Admitting: Medical

## 2014-12-27 ENCOUNTER — Other Ambulatory Visit (INDEPENDENT_AMBULATORY_CARE_PROVIDER_SITE_OTHER): Payer: PPO

## 2014-12-27 DIAGNOSIS — E876 Hypokalemia: Secondary | ICD-10-CM

## 2014-12-27 LAB — COMPREHENSIVE METABOLIC PANEL
ALT: 13 U/L (ref 0–35)
AST: 18 U/L (ref 0–37)
Albumin: 3.9 g/dL (ref 3.5–5.2)
Alkaline Phosphatase: 90 U/L (ref 39–117)
BILIRUBIN TOTAL: 0.4 mg/dL (ref 0.2–1.2)
BUN: 23 mg/dL (ref 6–23)
CALCIUM: 9.4 mg/dL (ref 8.4–10.5)
CHLORIDE: 103 meq/L (ref 96–112)
CO2: 28 meq/L (ref 19–32)
Creatinine, Ser: 0.94 mg/dL (ref 0.40–1.20)
GFR: 59.19 mL/min — AB (ref >60.00)
Glucose, Bld: 100 mg/dL — ABNORMAL HIGH (ref 70–99)
Potassium: 3.9 mEq/L (ref 3.5–5.1)
Sodium: 140 mEq/L (ref 135–145)
Total Protein: 7.2 g/dL (ref 6.0–8.3)

## 2014-12-27 MED ORDER — POTASSIUM CHLORIDE CRYS ER 10 MEQ PO TBCR: 10.0000 meq | EXTENDED_RELEASE_TABLET | Freq: Two times a day (BID) | ORAL | Status: DC

## 2014-12-27 MED ORDER — POTASSIUM CHLORIDE CRYS ER 10 MEQ PO TBCR: EXTENDED_RELEASE_TABLET | ORAL | Status: DC

## 2014-12-27 NOTE — Addendum Note (Signed)
Addended by: Neldon LabellaMABE, Tristy Udovich S on: 12/27/2014 05:39 PM   Modules accepted: Orders

## 2014-12-27 NOTE — Telephone Encounter (Signed)
kdur rx sent in. Cancel the mail order invision. [facility] local [facility]. Until cmp repeated in one month. Then will send mail order.  Future cmp in one month placed as well.

## 2014-12-27 NOTE — Telephone Encounter (Signed)
Rx can not be cancelled from mail order due to pharmacy having different address on file.

## 2015-01-11 ENCOUNTER — Ambulatory Visit (INDEPENDENT_AMBULATORY_CARE_PROVIDER_SITE_OTHER): Payer: PPO | Admitting: Internal Medicine

## 2015-01-11 ENCOUNTER — Encounter: Payer: Self-pay | Admitting: Internal Medicine

## 2015-01-11 VITALS — BP 132/66 | HR 76 | Temp 98.6°F | Resp 18 | Ht 61.0 in | Wt 113.0 lb

## 2015-01-11 DIAGNOSIS — K219 Gastro-esophageal reflux disease without esophagitis: Secondary | ICD-10-CM | POA: Diagnosis not present

## 2015-01-11 DIAGNOSIS — F0390 Unspecified dementia without behavioral disturbance: Secondary | ICD-10-CM | POA: Diagnosis not present

## 2015-01-11 DIAGNOSIS — E039 Hypothyroidism, unspecified: Secondary | ICD-10-CM

## 2015-01-11 DIAGNOSIS — I10 Essential (primary) hypertension: Secondary | ICD-10-CM

## 2015-01-11 DIAGNOSIS — E119 Type 2 diabetes mellitus without complications: Secondary | ICD-10-CM | POA: Diagnosis not present

## 2015-01-11 NOTE — Assessment & Plan Note (Signed)
Well controlled in past, not on any medication Will check blood work at her next visit She is compliant with a low sugar/carb diet

## 2015-01-11 NOTE — Patient Instructions (Signed)
   All other Health Maintenance issues reviewed.   All recommended immunizations and age-appropriate screenings are up-to-date.  No immunizations administered today.   Medications reviewed and updated.  No changes recommended at this time.   Please schedule followup in 6 months        

## 2015-01-11 NOTE — Assessment & Plan Note (Signed)
Controlled with omeprazole daily Continue daily omeprazole

## 2015-01-11 NOTE — Assessment & Plan Note (Signed)
BP controlled here today Continue current medications at current doses

## 2015-01-11 NOTE — Assessment & Plan Note (Signed)
Has been controlled Will check tsh at next visit Continue current dose of levothyroxine

## 2015-01-11 NOTE — Progress Notes (Signed)
Pre visit review using our clinic review tool, if applicable. No additional management support is needed unless otherwise documented below in the visit note. 

## 2015-01-11 NOTE — Progress Notes (Signed)
Subjective:    Patient ID: Desiree CooperAretta H [name], female    DOB: Jun 17, 1920, 81 y.o.   MRN: [id number]  HPI She is here to establish with a new pcp.  She is here with her daughter.  She lives alone and gets meals on wheels.  She does not cook. She has fallen 3-4 times in the past year.  She has not hurt herself.  She has medicalert.  She wants to remain in her house.  She does not drive.  She has poor hearing and vision.  She takes her own medication, but her daughter gets it ready for her.   Hypertension: She is taking her medication daily. She is compliant with a low sodium diet.  She denies chest pain, palpitations, edema, shortness of breath and regular headaches. She does not monitor her blood pressure at home.    Hypothyroidism:  She is taking her medication daily as prescribed.  She denies changes in her energy level or unexpected weight gain.  Her weight has slowly been decreasing, but her appetite has been good and her daughter feels she is eating good, but does not see how much she eats.  Her dose of medication has been stable recently.    GERD:  She takes omeprazole daily.  Her GERD is controlled.    Diabetes: She is controlling her sugars with diet. She is very compliant with a diabetic diet. She is not exercising regularly.  Back pain:  She has chronic back pain.  She will take tylenol as needed.    Medications and allergies reviewed with patient and updated if appropriate.  Patient Active Problem List   Diagnosis Date Noted  . Wellness examination 04/19/2014  . History of urinary tract infection 10/20/2013  . Gait disturbance 10/20/2013  . Malnutrition of moderate degree (HCC) 09/29/2013  . UTI (lower urinary tract infection) 09/28/2013  . Acute kidney injury (HCC) 09/28/2013  . Dehydration 09/28/2013  . UTI (urinary tract infection) 09/28/2013  . Rectal procidentia / prolapse 09/05/2011  . Routine health maintenance 06/15/2011  . GERD 03/24/2007  . URINARY INCONTINENCE  03/24/2007  . DIABETES MELLITUS, TYPE II 07/23/2006  . HYPERLIPIDEMIA 07/23/2006  . DEMENTIA 07/23/2006  . HYPERTENSION 07/23/2006  . Allergic rhinitis, cause unspecified 07/23/2006  . OSTEOARTHRITIS 07/23/2006  . HYPOTHYROIDISM 07/15/2006  . ANEMIA 07/15/2006  . CATARACTS 07/15/2006    Past Medical History  Diagnosis Date  . Dementia   . Diabetes mellitus     type  II- diet controlled  . Hyperlipidemia   . Hypertension   . Osteoarthritis     hands  . Allergic rhinitis   . Anemia   . GERD (gastroesophageal reflux disease)   . Hypothyroidism   . Urinary incontinence     stress  . Blood transfusion   . Osteoporosis   . Rectal prolapse   . Vision loss of right eye   . Glaucoma     bilateral  . Macular degeneration   . Hard of hearing   . PONV (postoperative nausea and vomiting)     Past Surgical History  Procedure Laterality Date  . Carpal tunnel release      bilateral  . Cataract extraction  2006    left  . Abdominal hysterectomy      nonmalignant reasons  . Vein ligation and stripping      left leg  . Right eye surgery  7-00  . Lumbar fusion  3-01    spinal cord leak- requiring 2  month admission    Social History   Social History  . Marital Status: Widowed    Spouse Name: N/A  . Number of Children: N/A  . Years of Education: N/A   Occupational History  . homemaker    Social History Main Topics  . Smoking status: Former Smoker    Quit date: 09/28/1973  . Smokeless tobacco: Never Used  . Alcohol Use: No  . Drug Use: No  . Sexual Activity: No   Other Topics Concern  . None   Social History Narrative   married 30 years -divorced; remarried 23 -widowed '83 . 1 daughter 1 son. 1 grandchild, 2 great grandchildren. 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    Terminal care issues: patient clearly states, daughter present, that she would not want heroic or futile care, e.g. CPR, mechanical vent. support,  etc. DNR/DNI, no HEROICs    Review of Systems  Constitutional: Positive for unexpected weight change (gradual weight loss). Negative for fever, chills and appetite change.  HENT: Positive for hearing loss. Negative for ear pain and sore throat.   Respiratory: Negative for cough, shortness of breath and wheezing.   Cardiovascular: Negative.   Gastrointestinal: Positive for constipation (occasional). Negative for nausea, abdominal pain, diarrhea and blood in stool.  Neurological: Negative for dizziness, light-headedness and headaches.       History of falls, no injuries  Psychiatric/Behavioral: Negative for behavioral problems, confusion and dysphoric mood. The patient is not nervous/anxious.        Short term memory loss       Objective:   Filed Vitals:   01/11/15 1324  BP: 132/66  Pulse: 76  Temp: 98.6 F (37 C)  Resp: 18   Filed Weights   01/11/15 1324  Weight: 113 lb (51.256 kg)   Body mass index is 21.36 kg/(m^2).   Physical Exam  Constitutional:  Elderly, frail female in no distress  HENT:  Head: Normocephalic and atraumatic.  Mouth/Throat: Oropharynx is clear and moist.  Eyes: Conjunctivae are normal.  Neck: Neck supple. No JVD present. No tracheal deviation present. No thyromegaly present.  Cardiovascular: Normal rate and regular rhythm.   Murmur (3/6 systolic murmur) heard. Pulmonary/Chest: Effort normal and breath sounds normal. No respiratory distress. She has no wheezes. She has no rales.  Abdominal: Soft. She exhibits no distension. There is no tenderness.  Musculoskeletal: She exhibits no edema.  Lymphadenopathy:    She has no cervical adenopathy.  Skin: Skin is warm and dry. No rash noted.  Psychiatric: She has a normal mood and affect. Her behavior is normal.          Assessment & Plan:   See Problem List.  Follow up in 6 months sooner if needed

## 2015-01-11 NOTE — Assessment & Plan Note (Signed)
Short term memory only Still living alone, does not drive Daughter very supportive and goes to see her 2-4 times a week

## 2015-05-06 ENCOUNTER — Telehealth: Payer: Self-pay | Admitting: *Deleted

## 2015-05-06 MED ORDER — POTASSIUM CHLORIDE CRYS ER 10 MEQ PO TBCR: 10.0000 meq | EXTENDED_RELEASE_TABLET | Freq: Every day | ORAL | Status: DC

## 2015-05-06 NOTE — Telephone Encounter (Signed)
Daughter left msg on triage stating mom is needing refills on her Potassium.. Notified daughter refills has been sent...Raechel Chute

## 2015-05-09 ENCOUNTER — Telehealth: Payer: Self-pay

## 2015-05-09 NOTE — Telephone Encounter (Signed)
Spoke with daughter and she states that they have switched providers and she will now see Dr. Vernon Prey. Request was sent in error.

## 2015-09-29 ENCOUNTER — Ambulatory Visit (INDEPENDENT_AMBULATORY_CARE_PROVIDER_SITE_OTHER): Payer: PPO | Admitting: Internal Medicine

## 2015-09-29 ENCOUNTER — Encounter: Payer: Self-pay | Admitting: Internal Medicine

## 2015-09-29 ENCOUNTER — Other Ambulatory Visit (INDEPENDENT_AMBULATORY_CARE_PROVIDER_SITE_OTHER): Payer: PPO

## 2015-09-29 VITALS — BP 150/70 | HR 80 | Temp 98.0°F | Resp 16 | Wt 114.0 lb

## 2015-09-29 DIAGNOSIS — E876 Hypokalemia: Secondary | ICD-10-CM

## 2015-09-29 DIAGNOSIS — E119 Type 2 diabetes mellitus without complications: Secondary | ICD-10-CM | POA: Diagnosis not present

## 2015-09-29 DIAGNOSIS — Z0000 Encounter for general adult medical examination without abnormal findings: Secondary | ICD-10-CM

## 2015-09-29 DIAGNOSIS — E785 Hyperlipidemia, unspecified: Secondary | ICD-10-CM

## 2015-09-29 DIAGNOSIS — F0390 Unspecified dementia without behavioral disturbance: Secondary | ICD-10-CM

## 2015-09-29 DIAGNOSIS — I10 Essential (primary) hypertension: Secondary | ICD-10-CM

## 2015-09-29 DIAGNOSIS — K219 Gastro-esophageal reflux disease without esophagitis: Secondary | ICD-10-CM

## 2015-09-29 DIAGNOSIS — E039 Hypothyroidism, unspecified: Secondary | ICD-10-CM

## 2015-09-29 DIAGNOSIS — R269 Unspecified abnormalities of gait and mobility: Secondary | ICD-10-CM

## 2015-09-29 LAB — CBC WITH DIFFERENTIAL/PLATELET
BASOS PCT: 0.3 % (ref 0.0–3.0)
Basophils Absolute: 0 10*3/uL (ref 0.0–0.1)
EOS ABS: 0.2 10*3/uL (ref 0.0–0.7)
EOS PCT: 2.5 % (ref 0.0–5.0)
HEMATOCRIT: 33 % — AB (ref 36.0–46.0)
Hemoglobin: 10.9 g/dL — ABNORMAL LOW (ref 12.0–15.0)
LYMPHS PCT: 16.2 % (ref 12.0–46.0)
Lymphs Abs: 1.3 10*3/uL (ref 0.7–4.0)
MCHC: 32.9 g/dL (ref 30.0–36.0)
MCV: 87.6 fl (ref 78.0–100.0)
MONO ABS: 0.5 10*3/uL (ref 0.1–1.0)
Monocytes Relative: 6.5 % (ref 3.0–12.0)
NEUTROS ABS: 5.8 10*3/uL (ref 1.4–7.7)
Neutrophils Relative %: 74.5 % (ref 43.0–77.0)
PLATELETS: 209 10*3/uL (ref 150.0–400.0)
RBC: 3.77 Mil/uL — ABNORMAL LOW (ref 3.87–5.11)
RDW: 14.7 % (ref 11.5–15.5)
WBC: 7.8 10*3/uL (ref 4.0–10.5)

## 2015-09-29 LAB — COMPREHENSIVE METABOLIC PANEL
ALT: 12 U/L (ref 0–35)
ALT: 12 U/L (ref 0–35)
AST: 17 U/L (ref 0–37)
AST: 18 U/L (ref 0–37)
Albumin: 4.1 g/dL (ref 3.5–5.2)
Albumin: 4.2 g/dL (ref 3.5–5.2)
Alkaline Phosphatase: 109 U/L (ref 39–117)
Alkaline Phosphatase: 109 U/L (ref 39–117)
BUN: 25 mg/dL — AB (ref 6–23)
BUN: 25 mg/dL — ABNORMAL HIGH (ref 6–23)
CALCIUM: 9.5 mg/dL (ref 8.4–10.5)
CHLORIDE: 107 meq/L (ref 96–112)
CHLORIDE: 107 meq/L (ref 96–112)
CO2: 24 meq/L (ref 19–32)
CO2: 25 meq/L (ref 19–32)
CREATININE: 0.99 mg/dL (ref 0.40–1.20)
CREATININE: 1 mg/dL (ref 0.40–1.20)
Calcium: 9.5 mg/dL (ref 8.4–10.5)
GFR: 55.02 mL/min — AB (ref >60.00)
GFR: 55.66 mL/min — ABNORMAL LOW (ref >60.00)
Glucose, Bld: 103 mg/dL — ABNORMAL HIGH (ref 70–99)
Glucose, Bld: 103 mg/dL — ABNORMAL HIGH (ref 70–99)
POTASSIUM: 4 meq/L (ref 3.5–5.1)
Potassium: 4 mEq/L (ref 3.5–5.1)
SODIUM: 141 meq/L (ref 135–145)
Sodium: 141 mEq/L (ref 135–145)
Total Bilirubin: 0.4 mg/dL (ref 0.2–1.2)
Total Bilirubin: 0.4 mg/dL (ref 0.2–1.2)
Total Protein: 7.5 g/dL (ref 6.0–8.3)
Total Protein: 7.6 g/dL (ref 6.0–8.3)

## 2015-09-29 LAB — LIPID PANEL
CHOLESTEROL: 122 mg/dL (ref 0–200)
HDL: 51.8 mg/dL (ref >39.00)
LDL CALC: 60 mg/dL (ref 0–99)
NonHDL: 70.41
TRIGLYCERIDES: 52 mg/dL (ref 0.0–149.0)
Total CHOL/HDL Ratio: 2
VLDL: 10.4 mg/dL (ref 0.0–40.0)

## 2015-09-29 LAB — TSH: TSH: 2.24 u[IU]/mL (ref 0.35–4.50)

## 2015-09-29 LAB — HEMOGLOBIN A1C: Hgb A1c MFr Bld: 5.9 % (ref 4.6–6.5)

## 2015-09-29 NOTE — Progress Notes (Signed)
Pre visit review using our clinic review tool, if applicable. No additional management support is needed unless otherwise documented below in the visit note. 

## 2015-09-29 NOTE — Assessment & Plan Note (Signed)
BP well controlled Current regimen effective and well tolerated Continue current medications at current doses cmp  

## 2015-09-29 NOTE — Assessment & Plan Note (Signed)
Stable Short term memory is poor Living independently with help from daughter meals and wheels

## 2015-09-29 NOTE — Assessment & Plan Note (Signed)
Off meds given age Can check lipid panel, but no therapy needed

## 2015-09-29 NOTE — Patient Instructions (Addendum)
Test(s) ordered today. Your results will be released to MyChart (or called to you) after review, usually within 72hours after test completion. If any changes need to be made, you will be notified at that same time.  All other Health Maintenance issues reviewed.   All recommended immunizations and age-appropriate screenings are up-to-date or discussed.  No immunizations administered today.   Medications reviewed and updated.  No changes recommended at this time.  Your prescription(s) have been submitted to your pharmacy. Please take as directed and contact our office if you believe you are having problem(s) with the medication(s).   Please followup in one year   Health Maintenance, Female Adopting a healthy lifestyle and getting preventive care can go a long way to promote health and wellness. Talk with your health care provider about what schedule of regular examinations is right for you. This is a good chance for you to check in with your provider about disease prevention and staying healthy. In between checkups, there are plenty of things you can do on your own. Experts have done a lot of research about which lifestyle changes and preventive measures are most likely to keep you healthy. Ask your health care provider for more information. WEIGHT AND DIET  Eat a healthy diet  Be sure to include plenty of vegetables, fruits, low-fat dairy products, and lean protein.  Do not eat a lot of foods high in solid fats, added sugars, or salt.  Get regular exercise. This is one of the most important things you can do for your health.  Most adults should exercise for at least 150 minutes each week. The exercise should increase your heart rate and make you sweat (moderate-intensity exercise).  Most adults should also do strengthening exercises at least twice a week. This is in addition to the moderate-intensity exercise.  Maintain a healthy weight  Body mass index (BMI) is a measurement that  can be used to identify possible weight problems. It estimates body fat based on height and weight. Your health care provider can help determine your BMI and help you achieve or maintain a healthy weight.  For females 20 years of age and older:   A BMI below 18.5 is considered underweight.  A BMI of 18.5 to 24.9 is normal.  A BMI of 25 to 29.9 is considered overweight.  A BMI of 30 and above is considered obese.  Watch levels of cholesterol and blood lipids  You should start having your blood tested for lipids and cholesterol at 80 years of age, then have this test every 5 years.  You may need to have your cholesterol levels checked more often if:  Your lipid or cholesterol levels are high.  You are older than 80 years of age.  You are at high risk for heart disease.  CANCER SCREENING   Lung Cancer  Lung cancer screening is recommended for adults 55-80 years old who are at high risk for lung cancer because of a history of smoking.  A yearly low-dose CT scan of the lungs is recommended for people who:  Currently smoke.  Have quit within the past 15 years.  Have at least a 30-pack-year history of smoking. A pack year is smoking an average of one pack of cigarettes a day for 1 year.  Yearly screening should continue until it has been 15 years since you quit.  Yearly screening should stop if you develop a health problem that would prevent you from having lung cancer treatment.  Breast Cancer    Practice breast self-awareness. This means understanding how your breasts normally appear and feel.  It also means doing regular breast self-exams. Let your health care provider know about any changes, no matter how small.  If you are in your 20s or 30s, you should have a clinical breast exam (CBE) by a health care provider every 1-3 years as part of a regular health exam.  If you are 40 or older, have a CBE every year. Also consider having a breast X-ray (mammogram) every  year.  If you have a family history of breast cancer, talk to your health care provider about genetic screening.  If you are at high risk for breast cancer, talk to your health care provider about having an MRI and a mammogram every year.  Breast cancer gene (BRCA) assessment is recommended for women who have family members with BRCA-related cancers. BRCA-related cancers include:  Breast.  Ovarian.  Tubal.  Peritoneal cancers.  Results of the assessment will determine the need for genetic counseling and BRCA1 and BRCA2 testing. Cervical Cancer Your health care provider may recommend that you be screened regularly for cancer of the pelvic organs (ovaries, uterus, and vagina). This screening involves a pelvic examination, including checking for microscopic changes to the surface of your cervix (Pap test). You may be encouraged to have this screening done every 3 years, beginning at age 21.  For women ages 30-65, health care providers may recommend pelvic exams and Pap testing every 3 years, or they may recommend the Pap and pelvic exam, combined with testing for human papilloma virus (HPV), every 5 years. Some types of HPV increase your risk of cervical cancer. Testing for HPV may also be done on women of any age with unclear Pap test results.  Other health care providers may not recommend any screening for nonpregnant women who are considered low risk for pelvic cancer and who do not have symptoms. Ask your health care provider if a screening pelvic exam is right for you.  If you have had past treatment for cervical cancer or a condition that could lead to cancer, you need Pap tests and screening for cancer for at least 20 years after your treatment. If Pap tests have been discontinued, your risk factors (such as having a new sexual partner) need to be reassessed to determine if screening should resume. Some women have medical problems that increase the chance of getting cervical cancer. In  these cases, your health care provider may recommend more frequent screening and Pap tests. Colorectal Cancer  This type of cancer can be detected and often prevented.  Routine colorectal cancer screening usually begins at 80 years of age and continues through 80 years of age.  Your health care provider may recommend screening at an earlier age if you have risk factors for colon cancer.  Your health care provider may also recommend using home test kits to check for hidden blood in the stool.  A small camera at the end of a tube can be used to examine your colon directly (sigmoidoscopy or colonoscopy). This is done to check for the earliest forms of colorectal cancer.  Routine screening usually begins at age 50.  Direct examination of the colon should be repeated every 5-10 years through 80 years of age. However, you may need to be screened more often if early forms of precancerous polyps or small growths are found. Skin Cancer  Check your skin from head to toe regularly.  Tell your health care provider about any   new moles or changes in moles, especially if there is a change in a mole's shape or color.  Also tell your health care provider if you have a mole that is larger than the size of a pencil eraser.  Always use sunscreen. Apply sunscreen liberally and repeatedly throughout the day.  Protect yourself by wearing long sleeves, pants, a wide-brimmed hat, and sunglasses whenever you are outside. HEART DISEASE, DIABETES, AND HIGH BLOOD PRESSURE   High blood pressure causes heart disease and increases the risk of stroke. High blood pressure is more likely to develop in:  People who have blood pressure in the high end of the normal range (130-139/85-89 mm Hg).  People who are overweight or obese.  People who are African American.  If you are 18-39 years of age, have your blood pressure checked every 3-5 years. If you are 40 years of age or older, have your blood pressure checked  every year. You should have your blood pressure measured twice--once when you are at a hospital or clinic, and once when you are not at a hospital or clinic. Record the average of the two measurements. To check your blood pressure when you are not at a hospital or clinic, you can use:  An automated blood pressure machine at a pharmacy.  A home blood pressure monitor.  If you are between 55 years and 79 years old, ask your health care provider if you should take aspirin to prevent strokes.  Have regular diabetes screenings. This involves taking a blood sample to check your fasting blood sugar level.  If you are at a normal weight and have a low risk for diabetes, have this test once every three years after 80 years of age.  If you are overweight and have a high risk for diabetes, consider being tested at a younger age or more often. PREVENTING INFECTION  Hepatitis B  If you have a higher risk for hepatitis B, you should be screened for this virus. You are considered at high risk for hepatitis B if:  You were born in a country where hepatitis B is common. Ask your health care provider which countries are considered high risk.  Your parents were born in a high-risk country, and you have not been immunized against hepatitis B (hepatitis B vaccine).  You have HIV or AIDS.  You use needles to inject street drugs.  You live with someone who has hepatitis B.  You have had sex with someone who has hepatitis B.  You get hemodialysis treatment.  You take certain medicines for conditions, including cancer, organ transplantation, and autoimmune conditions. Hepatitis C  Blood testing is recommended for:  Everyone born from 1945 through 1965.  Anyone with known risk factors for hepatitis C. Sexually transmitted infections (STIs)  You should be screened for sexually transmitted infections (STIs) including gonorrhea and chlamydia if:  You are sexually active and are younger than 80 years  of age.  You are older than 80 years of age and your health care provider tells you that you are at risk for this type of infection.  Your sexual activity has changed since you were last screened and you are at an increased risk for chlamydia or gonorrhea. Ask your health care provider if you are at risk.  If you do not have HIV, but are at risk, it may be recommended that you take a prescription medicine daily to prevent HIV infection. This is called pre-exposure prophylaxis (PrEP). You are considered at risk   if:  You are sexually active and do not regularly use condoms or know the HIV status of your partner(s).  You take drugs by injection.  You are sexually active with a partner who has HIV. Talk with your health care provider about whether you are at high risk of being infected with HIV. If you choose to begin PrEP, you should first be tested for HIV. You should then be tested every 3 months for as long as you are taking PrEP.  PREGNANCY   If you are premenopausal and you may become pregnant, ask your health care provider about preconception counseling.  If you may become pregnant, take 400 to 800 micrograms (mcg) of folic acid every day.  If you want to prevent pregnancy, talk to your health care provider about birth control (contraception). OSTEOPOROSIS AND MENOPAUSE   Osteoporosis is a disease in which the bones lose minerals and strength with aging. This can result in serious bone fractures. Your risk for osteoporosis can be identified using a bone density scan.  If you are 65 years of age or older, or if you are at risk for osteoporosis and fractures, ask your health care provider if you should be screened.  Ask your health care provider whether you should take a calcium or vitamin D supplement to lower your risk for osteoporosis.  Menopause may have certain physical symptoms and risks.  Hormone replacement therapy may reduce some of these symptoms and risks. Talk to your  health care provider about whether hormone replacement therapy is right for you.  HOME CARE INSTRUCTIONS   Schedule regular health, dental, and eye exams.  Stay current with your immunizations.   Do not use any tobacco products including cigarettes, chewing tobacco, or electronic cigarettes.  If you are pregnant, do not drink alcohol.  If you are breastfeeding, limit how much and how often you drink alcohol.  Limit alcohol intake to no more than 1 drink per day for nonpregnant women. One drink equals 12 ounces of beer, 5 ounces of wine, or 1 ounces of hard liquor.  Do not use street drugs.  Do not share needles.  Ask your health care provider for help if you need support or information about quitting drugs.  Tell your health care provider if you often feel depressed.  Tell your health care provider if you have ever been abused or do not feel safe at home.   This information is not intended to replace advice given to you by your health care provider. Make sure you discuss any questions you have with your health care provider.   Document Released: 09/11/2010 Document Revised: 03/19/2014 Document Reviewed: 01/28/2013 Elsevier Interactive Patient Education 2016 [employer]  

## 2015-09-29 NOTE — Assessment & Plan Note (Signed)
GERD controlled Continue daily medication  

## 2015-09-29 NOTE — Assessment & Plan Note (Signed)
Check a1c.   Diet controlled.

## 2015-09-29 NOTE — Progress Notes (Signed)
Subjective:    Patient ID: Desiree [name], female    DOB: 04-27-1920, 82 y.o.   MRN: [id number]  HPI She is here for a physical exam.  Her daughter is here with her.   She lives alone.  She gets meals on wheels.  She does not drive.  She does not cook.  She has fallen and continues to fall on occasion - loses her balance.  She has not hurt herself.   She has medialert.  She is able to take her own medication, but her daughter does get it ready for her.    She is taking her medication daily as prescribed.  She does not exercise.  She takes tylenol prn for her chronic back pain.    They have no concerns.  She does not eat too much and has lost a little weight.  Medications and allergies reviewed with patient and updated if appropriate.  Patient Active Problem List   Diagnosis Date Noted  . Wellness examination 04/19/2014  . History of urinary tract infection 10/20/2013  . Gait disturbance 10/20/2013  . Malnutrition of moderate degree (HCC) 09/29/2013  . Acute kidney injury (HCC) 09/28/2013  . Rectal procidentia / prolapse 09/05/2011  . Routine health maintenance 06/15/2011  . GERD 03/24/2007  . URINARY INCONTINENCE 03/24/2007  . Diabetes (HCC) 07/23/2006  . HYPERLIPIDEMIA 07/23/2006  . Dementia 07/23/2006  . Essential hypertension 07/23/2006  . Allergic rhinitis, cause unspecified 07/23/2006  . OSTEOARTHRITIS 07/23/2006  . Hypothyroidism 07/15/2006  . ANEMIA 07/15/2006  . CATARACTS 07/15/2006    Current Outpatient Prescriptions on File Prior to Visit  Medication Sig Dispense Refill  . acetaminophen (TYLENOL) 325 MG tablet Take 2 tablets (650 mg total) by mouth every 6 (six) hours as needed for mild pain (or Fever >/= 101). 30 tablet 0  . amLODipine (NORVASC) 10 MG tablet Take 1 tablet (10 mg total) by mouth daily with breakfast. 90 tablet 3  . aspirin 81 MG tablet Take 81 mg by mouth at bedtime.     . brimonidine (ALPHAGAN) 0.15 % ophthalmic solution Place 1 drop into  the left eye 2 (two) times daily. 45 mL 3  . calcium carbonate (TUMS) 500 MG chewable tablet Chew 2 tablets by mouth 2 (two) times daily. Takes 1 tablet at lunch and 1 tablet at dinner    . fluticasone (FLONASE) 50 MCG/ACT nasal spray Place 2 sprays into both nostrils daily. 48 g 1  . hydrochlorothiazide (HYDRODIURIL) 50 MG tablet Take 1 tablet (50 mg total) by mouth daily. 90 tablet 3  . levothyroxine (SYNTHROID, LEVOTHROID) 100 MCG tablet Take 1 tablet (100 mcg total) by mouth daily before breakfast. 90 tablet 3  . loratadine (CLARITIN) 10 MG tablet Take 10 mg by mouth daily as needed for allergies.    Marland Kitchen losartan (COZAAR) 100 MG tablet Take 1 tablet (100 mg total) by mouth daily. 90 tablet 3  . multivitamin (THERAGRAN) per tablet Take 1 tablet by mouth daily.     Marland Kitchen omeprazole (PRILOSEC) 40 MG capsule Take 1 capsule (40 mg total) by mouth daily with lunch. 90 capsule 3  . Polyethyl Glycol-Propyl Glycol (SYSTANE) 0.4-0.3 % SOLN Place 1 drop into both eyes 2 (two) times daily.    . potassium chloride (K-DUR,KLOR-CON) 10 MEQ tablet Take 1 tablet (10 mEq total) by mouth daily. 90 tablet 1  . vitamin C (ASCORBIC ACID) 500 MG tablet Take 1,000 mg by mouth daily.    . vitamin E (VITAMIN E)  1000 UNIT capsule Take 1,000 Units by mouth daily.      No current facility-administered medications on file prior to visit.    Past Medical History  Diagnosis Date  . Dementia   . Diabetes mellitus     type  II- diet controlled  . Hyperlipidemia   . Hypertension   . Osteoarthritis     hands  . Allergic rhinitis   . Anemia   . GERD (gastroesophageal reflux disease)   . Hypothyroidism   . Urinary incontinence     stress  . Blood transfusion   . Osteoporosis   . Rectal prolapse   . Vision loss of right eye   . Glaucoma     bilateral  . Macular degeneration   . Hard of hearing   . PONV (postoperative nausea and vomiting)     Past Surgical History  Procedure Laterality Date  . Carpal tunnel  release      bilateral  . Cataract extraction  2006    left  . Abdominal hysterectomy      nonmalignant reasons  . Vein ligation and stripping      left leg  . Right eye surgery  7-00  . Lumbar fusion  3-01    spinal cord leak- requiring 2 month admission    Social History   Social History  . Marital Status: Widowed    Spouse Name: N/A  . Number of Children: N/A  . Years of Education: N/A   Occupational History  . homemaker    Social History Main Topics  . Smoking status: Former Smoker    Quit date: 09/28/1973  . Smokeless tobacco: Never Used  . Alcohol Use: No  . Drug Use: No  . Sexual Activity: No   Other Topics Concern  . Not on file   Social History Narrative   married 30 years -divorced; remarried 378 -widowed '83 . 1 daughter 1 son. 1 grandchild, 2 great grandchildren. 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 lives alone - IADLs except driving.    Terminal care issues: patient clearly states, daughter present, that she would not want heroic or futile care, e.g. CPR, mechanical vent. support, etc. DNR/DNI, no HEROICs    Family History  Problem Relation Age of Onset  . Other Mother     CVA/ grief  . Heart disease Mother   . Heart attack Father   . Heart disease Father   . Heart attack Brother 47  . Cancer Neg Hx     breast or colon    Review of Systems  Constitutional: Negative for fever, chills and appetite change.  HENT: Positive for hearing loss and postnasal drip.   Eyes: Positive for visual disturbance (stable).  Respiratory: Negative for cough, shortness of breath and wheezing.   Cardiovascular: Positive for leg swelling (occasional). Negative for chest pain and palpitations.  Gastrointestinal: Negative for abdominal pain, diarrhea and constipation.       No gerd  Genitourinary: Negative for dysuria.  Musculoskeletal: Negative for back pain.  Neurological: Negative for light-headedness and headaches.  Psychiatric/Behavioral: Negative for  sleep disturbance and dysphoric mood. The patient is not nervous/anxious.        Objective:   Filed Vitals:   09/29/15 0915  BP: 150/70  Pulse: 80  Temp: 98 F (36.7 C)  Resp: 16   Filed Weights   09/29/15 0915  Weight: 114 lb (51.71 kg)   Body mass index is 21.55 kg/(m^2).   Physical Exam Constitutional: She appears  thin and frail. No distress.  HENT:  Head: Normocephalic and atraumatic.  Right Ear: External ear normal. Wearing hearing aids Left Ear: External ear normal.  Wearing hearing aids Mouth/Throat: Oropharynx is clear and moist.  Eyes: Conjunctivae and EOM are normal.  Neck: Neck supple. No tracheal deviation present. No thyromegaly present.  No carotid bruit  Cardiovascular: Normal rate, regular rhythm and normal heart sounds.   2/6 systolic murmur murmur heard.  No edema. Pulmonary/Chest: Effort normal and breath sounds normal. No respiratory distress. She has no wheezes. She has no rales.  Abdominal: Soft. She exhibits no distension. There is no tenderness.  Lymphadenopathy: She has no cervical adenopathy.  Skin: Skin is warm and dry. She is not diaphoretic.  Psychiatric: She has a normal mood and affect. Her behavior is normal.         Assessment & Plan:   Physical exam: Screening blood work   ordered Immunizations  Up to date or deferred Colonoscopy  No longer needed at her age Mammogram  No longer needed at her age Dexa  deferred Eye exams  Up to date  Exercise - none Weight BMI low normal -- discussed ways to help maintain weight - snacks, retry boost, ensure Skin  - no concerns Substance abuse none  See Problem List for Assessment and Plan of chronic medical problems.

## 2015-09-29 NOTE — Assessment & Plan Note (Signed)
Has fallen multiple times No major injuries Has medialert Does not want to use walker Wants to stay at home

## 2015-09-29 NOTE — Assessment & Plan Note (Signed)
Check tsh  Titrate med dose if needed  

## 2015-10-20 ENCOUNTER — Other Ambulatory Visit: Payer: Self-pay | Admitting: Internal Medicine

## 2015-12-15 ENCOUNTER — Other Ambulatory Visit: Payer: Self-pay | Admitting: Emergency Medicine

## 2015-12-15 ENCOUNTER — Encounter: Payer: Self-pay | Admitting: Internal Medicine

## 2015-12-15 MED ORDER — POTASSIUM CHLORIDE CRYS ER 10 MEQ PO TBCR: 10.0000 meq | EXTENDED_RELEASE_TABLET | Freq: Every day | ORAL | 2 refills | Status: DC

## 2015-12-15 MED ORDER — FLUTICASONE PROPIONATE 50 MCG/ACT NA SUSP: 2.0000 | Freq: Every day | NASAL | 1 refills | Status: DC

## 2015-12-15 MED ORDER — LOSARTAN POTASSIUM 100 MG PO TABS: 100.0000 mg | ORAL_TABLET | Freq: Every day | ORAL | 2 refills | Status: DC

## 2015-12-15 MED ORDER — HYDROCHLOROTHIAZIDE 50 MG PO TABS: 50.0000 mg | ORAL_TABLET | Freq: Every day | ORAL | 2 refills | Status: DC

## 2015-12-15 MED ORDER — LEVOTHYROXINE SODIUM 100 MCG PO TABS: 100.0000 ug | ORAL_TABLET | Freq: Every day | ORAL | 2 refills | Status: DC

## 2015-12-15 MED ORDER — OMEPRAZOLE 40 MG PO CPDR: 40.0000 mg | DELAYED_RELEASE_CAPSULE | Freq: Every day | ORAL | 2 refills | Status: DC

## 2015-12-15 MED ORDER — AMLODIPINE BESYLATE 10 MG PO TABS: 10.0000 mg | ORAL_TABLET | Freq: Every day | ORAL | 2 refills | Status: DC

## 2015-12-15 MED ORDER — BRIMONIDINE TARTRATE 0.15 % OP SOLN: 1.0000 [drp] | Freq: Two times a day (BID) | OPHTHALMIC | 2 refills | Status: DC

## 2016-01-05 ENCOUNTER — Encounter: Payer: Self-pay | Admitting: Internal Medicine

## 2016-04-09 ENCOUNTER — Other Ambulatory Visit: Payer: Self-pay | Admitting: *Deleted

## 2016-04-09 MED ORDER — POTASSIUM CHLORIDE CRYS ER 10 MEQ PO TBCR: 10.0000 meq | EXTENDED_RELEASE_TABLET | Freq: Every day | ORAL | 1 refills | Status: DC

## 2016-08-16 DIAGNOSIS — R42 Dizziness and giddiness: Secondary | ICD-10-CM | POA: Diagnosis not present

## 2016-08-16 DIAGNOSIS — R404 Transient alteration of awareness: Secondary | ICD-10-CM | POA: Diagnosis not present

## 2016-10-23 ENCOUNTER — Other Ambulatory Visit (INDEPENDENT_AMBULATORY_CARE_PROVIDER_SITE_OTHER): Payer: PPO

## 2016-10-23 ENCOUNTER — Encounter: Payer: Self-pay | Admitting: Internal Medicine

## 2016-10-23 ENCOUNTER — Ambulatory Visit (INDEPENDENT_AMBULATORY_CARE_PROVIDER_SITE_OTHER): Payer: PPO | Admitting: Internal Medicine

## 2016-10-23 VITALS — BP 152/62 | HR 89 | Temp 98.0°F | Resp 16 | Ht 61.0 in | Wt 113.0 lb

## 2016-10-23 DIAGNOSIS — E119 Type 2 diabetes mellitus without complications: Secondary | ICD-10-CM

## 2016-10-23 DIAGNOSIS — K219 Gastro-esophageal reflux disease without esophagitis: Secondary | ICD-10-CM | POA: Diagnosis not present

## 2016-10-23 DIAGNOSIS — I10 Essential (primary) hypertension: Secondary | ICD-10-CM

## 2016-10-23 DIAGNOSIS — E039 Hypothyroidism, unspecified: Secondary | ICD-10-CM | POA: Diagnosis not present

## 2016-10-23 DIAGNOSIS — Z0000 Encounter for general adult medical examination without abnormal findings: Secondary | ICD-10-CM | POA: Diagnosis not present

## 2016-10-23 DIAGNOSIS — F0390 Unspecified dementia without behavioral disturbance: Secondary | ICD-10-CM

## 2016-10-23 LAB — COMPREHENSIVE METABOLIC PANEL
ALT: 12 U/L (ref 0–35)
AST: 19 U/L (ref 0–37)
Albumin: 4.3 g/dL (ref 3.5–5.2)
Alkaline Phosphatase: 101 U/L (ref 39–117)
BUN: 23 mg/dL (ref 6–23)
CALCIUM: 9.7 mg/dL (ref 8.4–10.5)
CHLORIDE: 105 meq/L (ref 96–112)
CO2: 26 meq/L (ref 19–32)
CREATININE: 0.94 mg/dL (ref 0.40–1.20)
GFR: 58.95 mL/min — ABNORMAL LOW (ref >60.00)
Glucose, Bld: 105 mg/dL — ABNORMAL HIGH (ref 70–99)
Potassium: 4.5 mEq/L (ref 3.5–5.1)
SODIUM: 139 meq/L (ref 135–145)
Total Bilirubin: 0.5 mg/dL (ref 0.2–1.2)
Total Protein: 7.3 g/dL (ref 6.0–8.3)

## 2016-10-23 LAB — CBC WITH DIFFERENTIAL/PLATELET
BASOS PCT: 0.6 % (ref 0.0–3.0)
Basophils Absolute: 0 10*3/uL (ref 0.0–0.1)
EOS ABS: 0.2 10*3/uL (ref 0.0–0.7)
Eosinophils Relative: 2.1 % (ref 0.0–5.0)
HCT: 33.4 % — ABNORMAL LOW (ref 36.0–46.0)
Hemoglobin: 10.8 g/dL — ABNORMAL LOW (ref 12.0–15.0)
LYMPHS ABS: 1.4 10*3/uL (ref 0.7–4.0)
Lymphocytes Relative: 19.4 % (ref 12.0–46.0)
MCHC: 32.5 g/dL (ref 30.0–36.0)
MCV: 86.5 fl (ref 78.0–100.0)
MONO ABS: 0.4 10*3/uL (ref 0.1–1.0)
Monocytes Relative: 6.3 % (ref 3.0–12.0)
NEUTROS ABS: 5.1 10*3/uL (ref 1.4–7.7)
NEUTROS PCT: 71.6 % (ref 43.0–77.0)
PLATELETS: 207 10*3/uL (ref 150.0–400.0)
RBC: 3.85 Mil/uL — ABNORMAL LOW (ref 3.87–5.11)
RDW: 16.1 % — AB (ref 11.5–15.5)
WBC: 7.1 10*3/uL (ref 4.0–10.5)

## 2016-10-23 LAB — LIPID PANEL
CHOLESTEROL: 121 mg/dL (ref 0–200)
HDL: 52.6 mg/dL (ref >39.00)
LDL CALC: 59 mg/dL (ref 0–99)
NonHDL: 68.62
Total CHOL/HDL Ratio: 2
Triglycerides: 48 mg/dL (ref 0.0–149.0)
VLDL: 9.6 mg/dL (ref 0.0–40.0)

## 2016-10-23 LAB — TSH: TSH: 3.21 u[IU]/mL (ref 0.35–4.50)

## 2016-10-23 LAB — HEMOGLOBIN A1C: Hgb A1c MFr Bld: 6.1 % (ref 4.6–6.5)

## 2016-10-23 MED ORDER — HYDROCHLOROTHIAZIDE 50 MG PO TABS: 50.0000 mg | ORAL_TABLET | Freq: Every day | ORAL | 3 refills | Status: DC

## 2016-10-23 MED ORDER — POTASSIUM CHLORIDE CRYS ER 10 MEQ PO TBCR: 10.0000 meq | EXTENDED_RELEASE_TABLET | Freq: Every day | ORAL | 3 refills | Status: DC

## 2016-10-23 MED ORDER — FLUTICASONE PROPIONATE 50 MCG/ACT NA SUSP: 2.0000 | Freq: Every day | NASAL | 1 refills | Status: DC

## 2016-10-23 MED ORDER — LOSARTAN POTASSIUM 100 MG PO TABS: 100.0000 mg | ORAL_TABLET | Freq: Every day | ORAL | 2 refills | Status: DC

## 2016-10-23 MED ORDER — AMLODIPINE BESYLATE 10 MG PO TABS: 10.0000 mg | ORAL_TABLET | Freq: Every day | ORAL | 3 refills | Status: DC

## 2016-10-23 MED ORDER — OMEPRAZOLE 40 MG PO CPDR: 40.0000 mg | DELAYED_RELEASE_CAPSULE | Freq: Every day | ORAL | 3 refills | Status: DC

## 2016-10-23 MED ORDER — LEVOTHYROXINE SODIUM 100 MCG PO TABS: 100.0000 ug | ORAL_TABLET | Freq: Every day | ORAL | 3 refills | Status: DC

## 2016-10-23 MED ORDER — BRIMONIDINE TARTRATE 0.15 % OP SOLN: 1.0000 [drp] | Freq: Two times a day (BID) | OPHTHALMIC | 8 refills | Status: DC

## 2016-10-23 NOTE — Assessment & Plan Note (Signed)
Check a1c Controlled with lifestyle Monitor off medication

## 2016-10-23 NOTE — Assessment & Plan Note (Signed)
Check tsh  Titrate med dose if needed  

## 2016-10-23 NOTE — Assessment & Plan Note (Signed)
GERD controlled Continue daily medication  

## 2016-10-23 NOTE — Progress Notes (Signed)
Subjective:    Patient ID: Desiree [name], female    DOB: [birth year], 83 y.o.   MRN: [id number]  HPI Here for medicare wellness exam and a physical exam.   I have personally reviewed and have noted 1.The patient's medical and social history 2.Their use of alcohol, tobacco or illicit drugs 3.Their current medications and supplements 4.The patient's functional ability including ADL's, fall risks, home                 safety risk and hearing or visual impairment. 5.Diet and physical activities 6.Evidence for depression or mood disorders 7.Care team reviewed  -  none  Her daughter is with her today.  She lives alone and gets meals on wheels.  She does not drive. She does not cook.    Are there smokers in your home (other than you)? No  Risk Factors Exercise: yes - rides a stationary bike  Dietary issues discussed: meals on wheels once daily.  Sandwiches, fruit, snacks, which she prepares for herself  Cardiac risk factors: advanced age, hypertension, hyperlipidemia   Depression Screen  Have you felt down, depressed or hopeless? No  Have you felt little interest or pleasure in doing things?  No  Activities of Daily Living In your present state of health, do you have any difficulty performing the following activities?:  Driving? Yes - no longer drives Managing money?  Yes - daughter does finances Feeding yourself? No Getting from bed to chair? No Climbing a flight of stairs? Yes - no stairs Preparing food and eating?: does not prepare food - gets meals on wheels, able to eat independently Bathing or showering? No Getting dressed: No Getting to/using the toilet? No Moving around from place to place: yes, uses a cane In the past year have you fallen or had a near fall?: yes, opened refrig and lost balance, middle of te night and got dizzy   Are you sexually active?  No   Hearing Difficulties: yes - wears hearing  aids Do you often ask people to speak up or repeat themselves? yes Do you experience ringing or noises in your ears? No Do you have difficulty understanding soft or whispered voices? yes Vision:              Any change in vision:  no             Up to date with eye exam: no  Memory:  Do you feel that you have a problem with memory? Yes, has known dementia - short term memory is worse  Do you often misplace items? No  Do you feel safe at home?  Yes  Cognitive Testing  Alert, Orientated? Yes  Normal Appearance? Yes  Recall of three objects?  N/A  Can perform simple calculations? no  Displays appropriate judgment? Yes  Can read the correct time from a watch face? Yes   Advanced Directives have been discussed with the patient? Yes   Medications and allergies reviewed with patient and updated if appropriate.  Patient Active Problem List   Diagnosis Date Noted  . Gait disturbance 10/20/2013  . Malnutrition of moderate degree (HCC) 09/29/2013  . Rectal procidentia / prolapse 09/05/2011  . GERD 03/24/2007  . URINARY INCONTINENCE 03/24/2007  . Diabetes (HCC) 07/23/2006  . Hyperlipidemia 07/23/2006  . Dementia 07/23/2006  . Essential hypertension 07/23/2006  . Allergic rhinitis, cause unspecified 07/23/2006  . OSTEOARTHRITIS 07/23/2006  . Hypothyroidism 07/15/2006  . ANEMIA 07/15/2006  . CATARACTS 07/15/2006  Current Outpatient Prescriptions on File Prior to Visit  Medication Sig Dispense Refill  . acetaminophen (TYLENOL) 325 MG tablet Take 2 tablets (650 mg total) by mouth every 6 (six) hours as needed for mild pain (or Fever >/= 101). 30 tablet 0  . amLODipine (NORVASC) 10 MG tablet Take 1 tablet (10 mg total) by mouth daily with breakfast. 90 tablet 2  . aspirin 81 MG tablet Take 81 mg by mouth at bedtime.     . brimonidine (ALPHAGAN) 0.15 % ophthalmic solution Place 1 drop into the left eye 2 (two) times daily. 45 mL 2  . calcium carbonate (TUMS) 500 MG chewable tablet  Chew 2 tablets by mouth 2 (two) times daily. Takes 1 tablet at lunch and 1 tablet at dinner    . fluticasone (FLONASE) 50 MCG/ACT nasal spray Place 2 sprays into both nostrils daily. 48 g 1  . hydrochlorothiazide (HYDRODIURIL) 50 MG tablet Take 1 tablet (50 mg total) by mouth daily. 90 tablet 2  . levothyroxine (SYNTHROID, LEVOTHROID) 100 MCG tablet Take 1 tablet (100 mcg total) by mouth daily before breakfast. 90 tablet 2  . loratadine (CLARITIN) 10 MG tablet Take 10 mg by mouth daily as needed for allergies.    Marland Kitchen losartan (COZAAR) 100 MG tablet Take 1 tablet (100 mg total) by mouth daily. 90 tablet 2  . multivitamin (THERAGRAN) per tablet Take 1 tablet by mouth daily.     Marland Kitchen omeprazole (PRILOSEC) 40 MG capsule Take 1 capsule (40 mg total) by mouth daily with lunch. 90 capsule 2  . Polyethyl Glycol-Propyl Glycol (SYSTANE) 0.4-0.3 % SOLN Place 1 drop into both eyes 2 (two) times daily.    . potassium chloride (KLOR-CON M10) 10 MEQ tablet Take 1 tablet (10 mEq total) by mouth daily. Yearly physical due in July must see MD for future refills 90 tablet 1  . vitamin C (ASCORBIC ACID) 500 MG tablet Take 1,000 mg by mouth daily.    . vitamin E (VITAMIN E) 1000 UNIT capsule Take 1,000 Units by mouth daily.      No current facility-administered medications on file prior to visit.     Past Medical History:  Diagnosis Date  . Allergic rhinitis   . Anemia   . Blood transfusion   . Dementia   . Diabetes mellitus    type  II- diet controlled  . GERD (gastroesophageal reflux disease)   . Glaucoma    bilateral  . Hard of hearing   . Hyperlipidemia   . Hypertension   . Hypothyroidism   . Macular degeneration   . Osteoarthritis    hands  . Osteoporosis   . PONV (postoperative nausea and vomiting)   . Rectal prolapse   . Urinary incontinence    stress  . Vision loss of right eye     Past Surgical History:  Procedure Laterality Date  . ABDOMINAL HYSTERECTOMY     nonmalignant reasons  .  CARPAL TUNNEL RELEASE     bilateral  . CATARACT EXTRACTION  2006   left  . LUMBAR FUSION  3-01   spinal cord leak- requiring 2 month admission  . right eye surgery  7-00  . VEIN LIGATION AND STRIPPING     left leg    Social History   Social History  . Marital status: Widowed    Spouse name: N/A  . Number of children: N/A  . Years of education: N/A   Occupational History  . homemaker    Social  History Main Topics  . Smoking status: Former Smoker    Quit date: 09/28/1973  . Smokeless tobacco: Never Used  . Alcohol use No  . Drug use: No  . Sexual activity: No   Other Topics Concern  . None   Social History Narrative   married 30 years -divorced; remarried [id number] -widowed '83 . 1 daughter 1 son. 1 grandchild, 2 great grandchildren. 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 lives alone - IADLs except driving.    Terminal care issues: patient clearly states, daughter present, that she would not want heroic or futile care, e.g. CPR, mechanical vent. support, etc. DNR/DNI, no HEROICs    Family History  Problem Relation Age of Onset  . Other Mother        CVA/ grief  . Heart disease Mother   . Heart attack Father   . Heart disease Father   . Heart attack Brother 47  . Cancer Neg Hx        breast or colon    Review of Systems  Constitutional: Negative for appetite change, chills and fever.  Respiratory: Negative for cough, shortness of breath and wheezing.   Cardiovascular: Negative for chest pain, palpitations and leg swelling.  Gastrointestinal: Negative for abdominal pain, blood in stool, constipation and diarrhea.  Genitourinary: Negative for dysuria and hematuria.  Musculoskeletal: Negative for arthralgias and back pain.  Skin: Negative for color change.  Neurological: Negative for light-headedness and headaches.  Psychiatric/Behavioral: Negative for dysphoric mood. The patient is not nervous/anxious.        Objective:   Vitals:   10/23/16 0857  BP:  (!) 152/62  Pulse: 89  Resp: 16  Temp: 98 F (36.7 C)  SpO2: 98%   Filed Weights   10/23/16 0857  Weight: 113 lb (51.3 kg)   Body mass index is 21.35 kg/m.  Wt Readings from Last 3 Encounters:  10/23/16 113 lb (51.3 kg)  09/29/15 114 lb (51.7 kg)  01/11/15 113 lb (51.3 kg)     Physical Exam Constitutional: She appears well-developed and well-nourished. No distress.  HENT:  Head: Normocephalic and atraumatic.  Right Ear: External ear normal.  Left Ear: External ear normal.   Mouth/Throat: Oropharynx is clear and moist.  Eyes: Conjunctivae and EOM are normal.  Neck: Neck supple. No tracheal deviation present. No thyromegaly present.  No carotid bruit  Cardiovascular: Normal rate, regular rhythm and normal heart sounds.   No murmur heard.  No edema. Pulmonary/Chest: Effort normal and breath sounds normal. No respiratory distress. She has no wheezes. She has no rales.  Breast: deferred  Abdominal: Soft. She exhibits no distension. There is no tenderness.  Lymphadenopathy: She has no cervical adenopathy.  Skin: Skin is warm and dry. She is not diaphoretic.  Psychiatric: She has a normal mood and affect. Her behavior is normal.         Assessment & Plan:   Wellness Exam: Immunizations  Discussed shingrix, discussed tetanus Colonoscopy - no longer needed for age Mammogram  - no longer needed for age Dexa - she declined Eye exam - no longer sees the eye doctor Hearing loss  Yes - wear hearing aids Memory concerns/difficulties  Yes, short term memory Independent of ADLs    Yes, with help from daughter   Patient received copy of preventative screening tests/immunizations recommended for the next 5-10 years.   Physical exam: Screening blood work  ordered Immunizations  Discussed shingrix, discussed tetanus Colonoscopy - no longer needed secondary to age Mammogram -  no longer needed secondary to age 49 - no longer sees  Dexa  - she declined Eye exams - no longer  sees eye doctor EKG  Last done 2013 Exercise   none Weight   Good for age Skin  No concerns Substance abuse   none  See Problem List for Assessment and Plan of chronic medical problems.  FU annually

## 2016-10-23 NOTE — Patient Instructions (Addendum)
  [name] , Thank you for taking time to come for your Medicare Wellness Visit. I appreciate your ongoing commitment to your health goals. Please review the following plan we discussed and let me know if I can assist you in the future.   These are the goals we discussed: Goals    None      This is a list of the screening recommended for you and due dates:  Health Maintenance  Topic Date Due  . Hemoglobin A1C  03/31/2016  . Flu Shot  10/10/2016  . Eye exam for diabetics  03/12/2028*  . DEXA scan (bone density measurement)  03/12/2028*  . Tetanus Vaccine  03/12/2028*  . Complete foot exam   10/23/2017  . Pneumonia vaccines  Completed  *Topic was postponed. The date shown is not the original due date.     Test(s) ordered today. Your results will be released to MyChart (or called to you) after review, usually within 72hours after test completion. If any changes need to be made, you will be notified at that same time.  All other Health Maintenance issues reviewed.   All recommended immunizations and age-appropriate screenings are up-to-date or discussed.  No immunizations administered today.   Medications reviewed and updated.    No changes recommended at this time.  Your prescription(s) have been submitted to your pharmacy. Please take as directed and contact our office if you believe you are having problem(s) with the medication(s).   Please followup in one year

## 2016-10-23 NOTE — Assessment & Plan Note (Signed)
Slightly worse per daughter, still independent with daughters help

## 2016-10-23 NOTE — Assessment & Plan Note (Signed)
Good for her age Will not adjust medication Continue current medication

## 2017-03-25 ENCOUNTER — Encounter: Payer: Self-pay | Admitting: Internal Medicine

## 2017-03-25 MED ORDER — LEVOTHYROXINE SODIUM 100 MCG PO TABS: 100.0000 ug | ORAL_TABLET | Freq: Every day | ORAL | 2 refills | Status: DC

## 2017-03-25 MED ORDER — BRIMONIDINE TARTRATE 0.15 % OP SOLN: 1.0000 [drp] | Freq: Two times a day (BID) | OPHTHALMIC | 2 refills | Status: DC

## 2017-03-25 MED ORDER — HYDROCHLOROTHIAZIDE 50 MG PO TABS
50.0000 mg | ORAL_TABLET | Freq: Every day | ORAL | 2 refills | Status: DC
Start: 2017-03-25 — End: 2017-11-25

## 2017-03-25 MED ORDER — LOSARTAN POTASSIUM 100 MG PO TABS: 100.0000 mg | ORAL_TABLET | Freq: Every day | ORAL | 2 refills | Status: DC

## 2017-03-25 MED ORDER — AMLODIPINE BESYLATE 10 MG PO TABS: 10.0000 mg | ORAL_TABLET | Freq: Every day | ORAL | 2 refills | Status: DC

## 2017-03-25 MED ORDER — OMEPRAZOLE 40 MG PO CPDR: 40.0000 mg | DELAYED_RELEASE_CAPSULE | Freq: Every day | ORAL | 2 refills | Status: DC

## 2017-03-25 MED ORDER — POTASSIUM CHLORIDE CRYS ER 10 MEQ PO TBCR: 10.0000 meq | EXTENDED_RELEASE_TABLET | Freq: Every day | ORAL | 2 refills | Status: DC

## 2017-03-25 MED ORDER — FLUTICASONE PROPIONATE 50 MCG/ACT NA SUSP: 2.0000 | Freq: Every day | NASAL | 2 refills | Status: DC

## 2017-09-10 ENCOUNTER — Encounter: Payer: Self-pay | Admitting: Internal Medicine

## 2017-10-31 ENCOUNTER — Encounter: Payer: PPO | Admitting: Internal Medicine

## 2017-11-24 NOTE — Patient Instructions (Addendum)
Test(s) ordered today. Your results will be released to MyChart (or called to you) after review, usually within 72hours after test completion. If any changes need to be made, you will be notified at that same time.  All other Health Maintenance issues reviewed.   All recommended immunizations and age-appropriate screenings are up-to-date or discussed.  Flu immunization administered today.   Medications reviewed and updated.  No changes recommended at this time.  Your prescription(s) have been submitted to your pharmacy. Please take as directed and contact our office if you believe you are having problem(s) with the medication(s).  Please followup in one year    Health Maintenance, Female Adopting a healthy lifestyle and getting preventive care can go a long way to promote health and wellness. Talk with your health care provider about what schedule of regular examinations is right for you. This is a good chance for you to check in with your provider about disease prevention and staying healthy. In between checkups, there are plenty of things you can do on your own. Experts have done a lot of research about which lifestyle changes and preventive measures are most likely to keep you healthy. Ask your health care provider for more information. Weight and diet Eat a healthy diet  Be sure to include plenty of vegetables, fruits, low-fat dairy products, and lean protein.  Do not eat a lot of foods high in solid fats, added sugars, or salt.  Get regular exercise. This is one of the most important things you can do for your health. ? Most adults should exercise for at least 150 minutes each week. The exercise should increase your heart rate and make you sweat (moderate-intensity exercise). ? Most adults should also do strengthening exercises at least twice a week. This is in addition to the moderate-intensity exercise.  Maintain a healthy weight  Body mass index (BMI) is a measurement that can  be used to identify possible weight problems. It estimates body fat based on height and weight. Your health care provider can help determine your BMI and help you achieve or maintain a healthy weight.  For females 20 years of age and older: ? A BMI below 18.5 is considered underweight. ? A BMI of 18.5 to 24.9 is normal. ? A BMI of 25 to 29.9 is considered overweight. ? A BMI of 30 and above is considered obese.  Watch levels of cholesterol and blood lipids  You should start having your blood tested for lipids and cholesterol at 82 years of age, then have this test every 5 years.  You may need to have your cholesterol levels checked more often if: ? Your lipid or cholesterol levels are high. ? You are older than 82 years of age. ? You are at high risk for heart disease.  Cancer screening Lung Cancer  Lung cancer screening is recommended for adults 55-80 years old who are at high risk for lung cancer because of a history of smoking.  A yearly low-dose CT scan of the lungs is recommended for people who: ? Currently smoke. ? Have quit within the past 15 years. ? Have at least a 30-pack-year history of smoking. A pack year is smoking an average of one pack of cigarettes a day for 1 year.  Yearly screening should continue until it has been 15 years since you quit.  Yearly screening should stop if you develop a health problem that would prevent you from having lung cancer treatment.  Breast Cancer  Practice breast self-awareness.   means understanding how your breasts normally appear and feel.  It also means doing regular breast self-exams. Let your health care provider know about any changes, no matter how small.  If you are in your 20s or 30s, you should have a clinical breast exam (CBE) by a health care provider every 1-3 years as part of a regular health exam.  If you are 40 or older, have a CBE every year. Also consider having a breast X-ray (mammogram) every year.  If you  have a family history of breast cancer, talk to your health care provider about genetic screening.  If you are at high risk for breast cancer, talk to your health care provider about having an MRI and a mammogram every year.  Breast cancer gene (BRCA) assessment is recommended for women who have family members with BRCA-related cancers. BRCA-related cancers include: ? Breast. ? Ovarian. ? Tubal. ? Peritoneal cancers.  Results of the assessment will determine the need for genetic counseling and BRCA1 and BRCA2 testing.  Cervical Cancer Your health care provider may recommend that you be screened regularly for cancer of the pelvic organs (ovaries, uterus, and vagina). This screening involves a pelvic examination, including checking for microscopic changes to the surface of your cervix (Pap test). You may be encouraged to have this screening done every 3 years, beginning at age 21.  For women ages 30-65, health care providers may recommend pelvic exams and Pap testing every 3 years, or they may recommend the Pap and pelvic exam, combined with testing for human papilloma virus (HPV), every 5 years. Some types of HPV increase your risk of cervical cancer. Testing for HPV may also be done on women of any age with unclear Pap test results.  Other health care providers may not recommend any screening for nonpregnant women who are considered low risk for pelvic cancer and who do not have symptoms. Ask your health care provider if a screening pelvic exam is right for you.  If you have had past treatment for cervical cancer or a condition that could lead to cancer, you need Pap tests and screening for cancer for at least 20 years after your treatment. If Pap tests have been discontinued, your risk factors (such as having a new sexual partner) need to be reassessed to determine if screening should resume. Some women have medical problems that increase the chance of getting cervical cancer. In these cases,  your health care provider may recommend more frequent screening and Pap tests.  Colorectal Cancer  This type of cancer can be detected and often prevented.  Routine colorectal cancer screening usually begins at 82 years of age and continues through 82 years of age.  Your health care provider may recommend screening at an earlier age if you have risk factors for colon cancer.  Your health care provider may also recommend using home test kits to check for hidden blood in the stool.  A small camera at the end of a tube can be used to examine your colon directly (sigmoidoscopy or colonoscopy). This is done to check for the earliest forms of colorectal cancer.  Routine screening usually begins at age 50.  Direct examination of the colon should be repeated every 5-10 years through 82 years of age. However, you may need to be screened more often if early forms of precancerous polyps or small growths are found.  Skin Cancer  Check your skin from head to toe regularly.  Tell your health care provider about any new   any new moles or changes in moles, especially if there is a change in a mole's shape or color.  Also tell your health care provider if you have a mole that is larger than the size of a pencil eraser.  Always use sunscreen. Apply sunscreen liberally and repeatedly throughout the day.  Protect yourself by wearing long sleeves, pants, a wide-brimmed hat, and sunglasses whenever you are outside.  Heart disease, diabetes, and high blood pressure  High blood pressure causes heart disease and increases the risk of stroke. High blood pressure is more likely to develop in: ? People who have blood pressure in the high end of the normal range (130-139/85-89 mm Hg). ? People who are overweight or obese. ? People who are African American.  If you are 38-35 years of age, have your blood pressure checked every 3-5 years. If you are 37 years of age or older, have your blood pressure checked every year.  You should have your blood pressure measured twice-once when you are at a hospital or clinic, and once when you are not at a hospital or clinic. Record the average of the two measurements. To check your blood pressure when you are not at a hospital or clinic, you can use: ? An automated blood pressure machine at a pharmacy. ? A home blood pressure monitor.  If you are between 45 years and 85 years old, ask your health care provider if you should take aspirin to prevent strokes.  Have regular diabetes screenings. This involves taking a blood sample to check your fasting blood sugar level. ? If you are at a normal weight and have a low risk for diabetes, have this test once every three years after 82 years of age. ? If you are overweight and have a high risk for diabetes, consider being tested at a younger age or more often. Preventing infection Hepatitis B  If you have a higher risk for hepatitis B, you should be screened for this virus. You are considered at high risk for hepatitis B if: ? You were born in a country where hepatitis B is common. Ask your health care provider which countries are considered high risk. ? Your parents were born in a high-risk country, and you have not been immunized against hepatitis B (hepatitis B vaccine). ? You have HIV or AIDS. ? You use needles to inject street drugs. ? You live with someone who has hepatitis B. ? You have had sex with someone who has hepatitis B. ? You get hemodialysis treatment. ? You take certain medicines for conditions, including cancer, organ transplantation, and autoimmune conditions.  Hepatitis C  Blood testing is recommended for: ? Everyone born from [birth year] through 1963. ? Anyone with known risk factors for hepatitis C.  Sexually transmitted infections (STIs)  You should be screened for sexually transmitted infections (STIs) including gonorrhea and chlamydia if: ? You are sexually active and are younger than 82 years of  age. ? You are older than 82 years of age and your health care provider tells you that you are at risk for this type of infection. ? Your sexual activity has changed since you were last screened and you are at an increased risk for chlamydia or gonorrhea. Ask your health care provider if you are at risk.  If you do not have HIV, but are at risk, it may be recommended that you take a prescription medicine daily to prevent HIV infection. This is called pre-exposure prophylaxis (PrEP). You are considered  at risk if: ? You are sexually active and do not regularly use condoms or know the HIV status of your partner(s). ? You take drugs by injection. ? You are sexually active with a partner who has HIV.  Talk with your health care provider about whether you are at high risk of being infected with HIV. If you choose to begin PrEP, you should first be tested for HIV. You should then be tested every 3 months for as long as you are taking PrEP. Pregnancy  If you are premenopausal and you may become pregnant, ask your health care provider about preconception counseling.  If you may become pregnant, take 400 to 800 micrograms (mcg) of folic acid every day.  If you want to prevent pregnancy, talk to your health care provider about birth control (contraception). Osteoporosis and menopause  Osteoporosis is a disease in which the bones lose minerals and strength with aging. This can result in serious bone fractures. Your risk for osteoporosis can be identified using a bone density scan.  If you are 6 years of age or older, or if you are at risk for osteoporosis and fractures, ask your health care provider if you should be screened.  Ask your health care provider whether you should take a calcium or vitamin D supplement to lower your risk for osteoporosis.  Menopause may have certain physical symptoms and risks.  Hormone replacement therapy may reduce some of these symptoms and risks. Talk to your health  care provider about whether hormone replacement therapy is right for you. Follow these instructions at home:  Schedule regular health, dental, and eye exams.  Stay current with your immunizations.  Do not use any tobacco products including cigarettes, chewing tobacco, or electronic cigarettes.  If you are pregnant, do not drink alcohol.  If you are breastfeeding, limit how much and how often you drink alcohol.  Limit alcohol intake to no more than 1 drink per day for nonpregnant women. One drink equals 12 ounces of beer, 5 ounces of wine, or 1 ounces of hard liquor.  Do not use street drugs.  Do not share needles.  Ask your health care provider for help if you need support or information about quitting drugs.  Tell your health care provider if you often feel depressed.  Tell your health care provider if you have ever been abused or do not feel safe at home. This information is not intended to replace advice given to you by your health care provider. Make sure you discuss any questions you have with your health care provider. Document Released: 09/11/2010 Document Revised: 08/04/2015 Document Reviewed: 11/30/2014 Elsevier Interactive Patient Education  [employer].

## 2017-11-24 NOTE — Progress Notes (Signed)
Subjective:    Patient ID: Desiree [name], female    DOB: September 29, 1920, 84 y.o.   MRN: [id number]  HPI She is here for a physical exam.   She is here with her daughter.  She has no concerns and her daughter states she is doing pretty well.  She does have very limited short-term memory, but does not have any complaints and never complains of anything.  She continues to live alone.  She does not drive not cook.  She gets Meals on Wheels.  She sometimes does not eat regularly and has lost some weight, but she is very inactive.  She does do some exercise on a floor peddling machine a couple of times a day.  Medications and allergies reviewed with patient and updated if appropriate.  Patient Active Problem List   Diagnosis Date Noted  . Gait disturbance 10/20/2013  . Malnutrition of moderate degree (HCC) 09/29/2013  . Rectal procidentia / prolapse 09/05/2011  . GERD 03/24/2007  . URINARY INCONTINENCE 03/24/2007  . Diabetes (HCC) 07/23/2006  . Hyperlipidemia 07/23/2006  . Dementia 07/23/2006  . Essential hypertension 07/23/2006  . Allergic rhinitis, cause unspecified 07/23/2006  . OSTEOARTHRITIS 07/23/2006  . Hypothyroidism 07/15/2006  . ANEMIA 07/15/2006  . CATARACTS 07/15/2006    Current Outpatient Medications on File Prior to Visit  Medication Sig Dispense Refill  . acetaminophen (TYLENOL) 325 MG tablet Take 2 tablets (650 mg total) by mouth every 6 (six) hours as needed for mild pain (or Fever >/= 101). 30 tablet 0  . aspirin 81 MG tablet Take 81 mg by mouth at bedtime.     . calcium carbonate (TUMS) 500 MG chewable tablet Chew 2 tablets by mouth 2 (two) times daily. Takes 1 tablet at lunch and 1 tablet at dinner    . loratadine (CLARITIN) 10 MG tablet Take 10 mg by mouth daily as needed for allergies.    . multivitamin (THERAGRAN) per tablet Take 1 tablet by mouth daily.     Bertram Gala Glycol-Propyl Glycol (SYSTANE) 0.4-0.3 % SOLN Place 1 drop into both eyes 2 (two) times  daily.    . vitamin C (ASCORBIC ACID) 500 MG tablet Take 1,000 mg by mouth daily.    . vitamin E (VITAMIN E) 1000 UNIT capsule Take 1,000 Units by mouth daily.      No current facility-administered medications on file prior to visit.     Past Medical History:  Diagnosis Date  . Allergic rhinitis   . Anemia   . Blood transfusion   . Dementia   . Diabetes mellitus    type  II- diet controlled  . GERD (gastroesophageal reflux disease)   . Glaucoma    bilateral  . Hard of hearing   . Hyperlipidemia   . Hypertension   . Hypothyroidism   . Macular degeneration   . Osteoarthritis    hands  . Osteoporosis   . PONV (postoperative nausea and vomiting)   . Rectal prolapse   . Urinary incontinence    stress  . Vision loss of right eye     Past Surgical History:  Procedure Laterality Date  . ABDOMINAL HYSTERECTOMY     nonmalignant reasons  . CARPAL TUNNEL RELEASE     bilateral  . CATARACT EXTRACTION  2006   left  . LUMBAR FUSION  3-01   spinal cord leak- requiring 2 month admission  . right eye surgery  7-00  . VEIN LIGATION AND STRIPPING  left leg    Social History   Socioeconomic History  . Marital status: Widowed    Spouse name: Not on file  . Number of children: Not on file  . Years of education: Not on file  . Highest education level: Not on file  Occupational History  . Occupation: homemaker  Social Needs  . Financial resource strain: Not on file  . Food insecurity:    Worry: Not on file    Inability: Not on file  . Transportation needs:    Medical: Not on file    Non-medical: Not on file  Tobacco Use  . Smoking status: Former Smoker    Last attempt to quit: 09/28/1973    Years since quitting: 44.1  . Smokeless tobacco: Never Used  Substance and Sexual Activity  . Alcohol use: No  . Drug use: No  . Sexual activity: Never  Lifestyle  . Physical activity:    Days per week: Not on file    Minutes per session: Not on file  . Stress: Not on file    Relationships  . Social connections:    Talks on phone: Not on file    Gets together: Not on file    Attends religious service: Not on file    Active member of club or organization: Not on file    Attends meetings of clubs or organizations: Not on file    Relationship status: Not on file  Other Topics Concern  . Not on file  Social History Narrative   married 30 years -divorced; remarried 36 -widowed '83 . 1 daughter 1 son. 1 grandchild, 2 great grandchildren. 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    Terminal care issues: patient clearly states, daughter present, that she would not want heroic or futile care, e.g. CPR, mechanical vent. support, etc. DNR/DNI, no HEROICs    Family History  Problem Relation Age of Onset  . Other Mother        CVA/ grief  . Heart disease Mother   . Heart attack Father   . Heart disease Father   . Heart attack Brother 47  . Cancer Neg Hx        breast or colon    Review of Systems  Constitutional: Negative for chills and fever.  HENT: Positive for hearing loss.   Eyes: Negative for visual disturbance.  Respiratory: Negative for cough, shortness of breath and wheezing.   Cardiovascular: Negative for chest pain, palpitations and leg swelling.  Gastrointestinal: Negative for abdominal pain, blood in stool, constipation, diarrhea and nausea.  Genitourinary: Negative for dysuria.  Musculoskeletal: Negative for arthralgias and back pain.  Skin: Negative for color change.  Neurological: Negative for light-headedness and headaches.  Psychiatric/Behavioral: Negative for dysphoric mood and sleep disturbance. The patient is not nervous/anxious.        Objective:   Vitals:   11/25/17 0856  BP: (!) 122/58  Pulse: 70  Resp: 16  Temp: 98.4 F (36.9 C)  SpO2: 98%   Filed Weights   11/25/17 0856  Weight: 106 lb 1.9 oz (48.1 kg)   Body mass index is 20.05 kg/m.  Wt Readings from Last 3 Encounters:   11/25/17 106 lb 1.9 oz (48.1 kg)  10/23/16 113 lb (51.3 kg)  09/29/15 114 lb (51.7 kg)     Physical Exam Constitutional: She appears well-developed and well-nourished. No distress.  HENT:  Head: Normocephalic and atraumatic.  Right Ear: External ear normal.  Left  Ear: External ear normal.   Mouth/Throat: Oropharynx is clear and moist.  Eyes: Conjunctivae  are normal.  Neck: Neck supple. No tracheal deviation present. No thyromegaly present.  No carotid bruit  Cardiovascular: Normal rate, regular rhythm and normal heart sounds.   3/6 murmur heard.  No edema. Pulmonary/Chest: Effort normal and breath sounds normal. No respiratory distress. She has no wheezes. She has no rales.  Breast: deferred Abdominal: Soft. She exhibits no distension. There is no tenderness.  Lymphadenopathy: She has no cervical adenopathy.  Skin: Skin is warm and dry. She is not diaphoretic.  Psychiatric: She has a normal mood and affect. Her behavior is normal.        Assessment & Plan:   Physical exam: Screening blood work  ordered Immunizations   Flu vaccine today  Colonoscopy  Aged out Mammogram   Aged out Gyn   No longer seeing Dexa   deferred EKG  Last done 2013 Exercise  Rides a stationary bike 2/ day Weight   Has lost weight and does not eat much - daughter encouraged snacking and eating meals Skin no concerns Substance abuse  none  See Problem List for Assessment and Plan of chronic medical problems.    FU annually

## 2017-11-25 ENCOUNTER — Other Ambulatory Visit (INDEPENDENT_AMBULATORY_CARE_PROVIDER_SITE_OTHER): Payer: Medicare HMO

## 2017-11-25 ENCOUNTER — Ambulatory Visit (INDEPENDENT_AMBULATORY_CARE_PROVIDER_SITE_OTHER): Payer: Medicare HMO | Admitting: Internal Medicine

## 2017-11-25 ENCOUNTER — Encounter: Payer: Self-pay | Admitting: Internal Medicine

## 2017-11-25 VITALS — BP 122/58 | HR 70 | Temp 98.4°F | Resp 16 | Ht 61.0 in | Wt 106.1 lb

## 2017-11-25 DIAGNOSIS — E782 Mixed hyperlipidemia: Secondary | ICD-10-CM

## 2017-11-25 DIAGNOSIS — K219 Gastro-esophageal reflux disease without esophagitis: Secondary | ICD-10-CM

## 2017-11-25 DIAGNOSIS — E119 Type 2 diabetes mellitus without complications: Secondary | ICD-10-CM | POA: Diagnosis not present

## 2017-11-25 DIAGNOSIS — Z23 Encounter for immunization: Secondary | ICD-10-CM | POA: Diagnosis not present

## 2017-11-25 DIAGNOSIS — Z0000 Encounter for general adult medical examination without abnormal findings: Secondary | ICD-10-CM

## 2017-11-25 DIAGNOSIS — F0390 Unspecified dementia without behavioral disturbance: Secondary | ICD-10-CM

## 2017-11-25 DIAGNOSIS — I10 Essential (primary) hypertension: Secondary | ICD-10-CM

## 2017-11-25 DIAGNOSIS — E039 Hypothyroidism, unspecified: Secondary | ICD-10-CM

## 2017-11-25 DIAGNOSIS — R69 Illness, unspecified: Secondary | ICD-10-CM | POA: Diagnosis not present

## 2017-11-25 LAB — LIPID PANEL
Cholesterol: 123 mg/dL (ref 0–200)
HDL: 47.2 mg/dL (ref >39.00)
LDL Cholesterol: 65 mg/dL (ref 0–99)
NonHDL: 76.27
TRIGLYCERIDES: 55 mg/dL (ref 0.0–149.0)
Total CHOL/HDL Ratio: 3
VLDL: 11 mg/dL (ref 0.0–40.0)

## 2017-11-25 LAB — COMPREHENSIVE METABOLIC PANEL
ALK PHOS: 98 U/L (ref 39–117)
ALT: 13 U/L (ref 0–35)
AST: 21 U/L (ref 0–37)
Albumin: 4 g/dL (ref 3.5–5.2)
BILIRUBIN TOTAL: 0.5 mg/dL (ref 0.2–1.2)
BUN: 27 mg/dL — ABNORMAL HIGH (ref 6–23)
CALCIUM: 9.4 mg/dL (ref 8.4–10.5)
CO2: 28 mEq/L (ref 19–32)
Chloride: 104 mEq/L (ref 96–112)
Creatinine, Ser: 1.02 mg/dL (ref 0.40–1.20)
GFR: 53.52 mL/min — AB (ref >60.00)
Glucose, Bld: 101 mg/dL — ABNORMAL HIGH (ref 70–99)
Potassium: 4.1 mEq/L (ref 3.5–5.1)
Sodium: 139 mEq/L (ref 135–145)
TOTAL PROTEIN: 7.3 g/dL (ref 6.0–8.3)

## 2017-11-25 LAB — CBC WITH DIFFERENTIAL/PLATELET
BASOS ABS: 0 10*3/uL (ref 0.0–0.1)
Basophils Relative: 0.7 % (ref 0.0–3.0)
Eosinophils Absolute: 0.2 10*3/uL (ref 0.0–0.7)
Eosinophils Relative: 2.9 % (ref 0.0–5.0)
HEMATOCRIT: 32 % — AB (ref 36.0–46.0)
HEMOGLOBIN: 10.6 g/dL — AB (ref 12.0–15.0)
LYMPHS PCT: 19.2 % (ref 12.0–46.0)
Lymphs Abs: 1.3 10*3/uL (ref 0.7–4.0)
MCHC: 33.1 g/dL (ref 30.0–36.0)
MCV: 88.3 fl (ref 78.0–100.0)
Monocytes Absolute: 0.4 10*3/uL (ref 0.1–1.0)
Monocytes Relative: 5.9 % (ref 3.0–12.0)
Neutro Abs: 4.6 10*3/uL (ref 1.4–7.7)
Neutrophils Relative %: 71.3 % (ref 43.0–77.0)
Platelets: 196 10*3/uL (ref 150.0–400.0)
RBC: 3.63 Mil/uL — AB (ref 3.87–5.11)
RDW: 14.3 % (ref 11.5–15.5)
WBC: 6.5 10*3/uL (ref 4.0–10.5)

## 2017-11-25 LAB — TSH: TSH: 2.26 u[IU]/mL (ref 0.35–4.50)

## 2017-11-25 LAB — HEMOGLOBIN A1C: HEMOGLOBIN A1C: 5.9 % (ref 4.6–6.5)

## 2017-11-25 MED ORDER — BRIMONIDINE TARTRATE 0.15 % OP SOLN: 1.0000 [drp] | Freq: Two times a day (BID) | OPHTHALMIC | 2 refills | Status: AC

## 2017-11-25 MED ORDER — OMEPRAZOLE 40 MG PO CPDR: 40.0000 mg | DELAYED_RELEASE_CAPSULE | Freq: Every day | ORAL | 2 refills | Status: AC

## 2017-11-25 MED ORDER — HYDROCHLOROTHIAZIDE 50 MG PO TABS: 50.0000 mg | ORAL_TABLET | Freq: Every day | ORAL | 2 refills | Status: AC

## 2017-11-25 MED ORDER — POTASSIUM CHLORIDE CRYS ER 10 MEQ PO TBCR: 10.0000 meq | EXTENDED_RELEASE_TABLET | Freq: Every day | ORAL | 2 refills | Status: AC

## 2017-11-25 MED ORDER — LEVOTHYROXINE SODIUM 100 MCG PO TABS: 100.0000 ug | ORAL_TABLET | Freq: Every day | ORAL | 2 refills | Status: AC

## 2017-11-25 MED ORDER — AMLODIPINE BESYLATE 10 MG PO TABS: 10.0000 mg | ORAL_TABLET | Freq: Every day | ORAL | 2 refills | Status: AC

## 2017-11-25 MED ORDER — FLUTICASONE PROPIONATE 50 MCG/ACT NA SUSP: 2.0000 | Freq: Every day | NASAL | 2 refills | Status: AC

## 2017-11-25 MED ORDER — LOSARTAN POTASSIUM 100 MG PO TABS: 100.0000 mg | ORAL_TABLET | Freq: Every day | ORAL | 2 refills | Status: AC

## 2017-11-25 NOTE — Assessment & Plan Note (Signed)
Clinically euthyroid Check tsh  Titrate med dose if needed  

## 2017-11-25 NOTE — Assessment & Plan Note (Signed)
GERD controlled Continue daily medication  

## 2017-11-25 NOTE — Assessment & Plan Note (Signed)
a1c Diet controlled

## 2017-11-25 NOTE — Assessment & Plan Note (Signed)
BP well controlled Current regimen effective and well tolerated Continue current medications at current doses cmp  

## 2017-11-25 NOTE — Assessment & Plan Note (Signed)
Poor short term memory Still living alone, does not drive Gets meals on wheels Daughter lives close

## 2017-11-25 NOTE — Assessment & Plan Note (Signed)
Check lipid panel  At her age no need to consider medication given age Regular exercise and healthy diet encouraged

## 2017-11-26 ENCOUNTER — Encounter: Payer: Self-pay | Admitting: Internal Medicine

## 2017-12-30 DIAGNOSIS — R69 Illness, unspecified: Secondary | ICD-10-CM | POA: Diagnosis not present

## 2018-01-21 DIAGNOSIS — R69 Illness, unspecified: Secondary | ICD-10-CM | POA: Diagnosis not present

## 2018-02-07 ENCOUNTER — Encounter (HOSPITAL_COMMUNITY): Payer: Self-pay

## 2018-02-07 ENCOUNTER — Emergency Department (HOSPITAL_COMMUNITY): Payer: Medicare HMO

## 2018-02-07 ENCOUNTER — Ambulatory Visit: Payer: Self-pay

## 2018-02-07 DIAGNOSIS — Z882 Allergy status to sulfonamides status: Secondary | ICD-10-CM

## 2018-02-07 DIAGNOSIS — D631 Anemia in chronic kidney disease: Secondary | ICD-10-CM | POA: Diagnosis present

## 2018-02-07 DIAGNOSIS — M1990 Unspecified osteoarthritis, unspecified site: Secondary | ICD-10-CM | POA: Diagnosis present

## 2018-02-07 DIAGNOSIS — Z888 Allergy status to other drugs, medicaments and biological substances status: Secondary | ICD-10-CM

## 2018-02-07 DIAGNOSIS — M6282 Rhabdomyolysis: Secondary | ICD-10-CM | POA: Diagnosis not present

## 2018-02-07 DIAGNOSIS — N182 Chronic kidney disease, stage 2 (mild): Secondary | ICD-10-CM | POA: Diagnosis present

## 2018-02-07 DIAGNOSIS — E739 Lactose intolerance, unspecified: Secondary | ICD-10-CM | POA: Diagnosis present

## 2018-02-07 DIAGNOSIS — E039 Hypothyroidism, unspecified: Secondary | ICD-10-CM | POA: Diagnosis not present

## 2018-02-07 DIAGNOSIS — Z981 Arthrodesis status: Secondary | ICD-10-CM

## 2018-02-07 DIAGNOSIS — S069X9A Unspecified intracranial injury with loss of consciousness of unspecified duration, initial encounter: Secondary | ICD-10-CM | POA: Diagnosis not present

## 2018-02-07 DIAGNOSIS — H409 Unspecified glaucoma: Secondary | ICD-10-CM | POA: Diagnosis present

## 2018-02-07 DIAGNOSIS — W19XXXA Unspecified fall, initial encounter: Secondary | ICD-10-CM | POA: Diagnosis present

## 2018-02-07 DIAGNOSIS — Z7982 Long term (current) use of aspirin: Secondary | ICD-10-CM

## 2018-02-07 DIAGNOSIS — I214 Non-ST elevation (NSTEMI) myocardial infarction: Secondary | ICD-10-CM | POA: Diagnosis not present

## 2018-02-07 DIAGNOSIS — I10 Essential (primary) hypertension: Secondary | ICD-10-CM

## 2018-02-07 DIAGNOSIS — E785 Hyperlipidemia, unspecified: Secondary | ICD-10-CM | POA: Diagnosis present

## 2018-02-07 DIAGNOSIS — N390 Urinary tract infection, site not specified: Secondary | ICD-10-CM | POA: Diagnosis present

## 2018-02-07 DIAGNOSIS — H9193 Unspecified hearing loss, bilateral: Secondary | ICD-10-CM | POA: Diagnosis not present

## 2018-02-07 DIAGNOSIS — F419 Anxiety disorder, unspecified: Secondary | ICD-10-CM | POA: Diagnosis present

## 2018-02-07 DIAGNOSIS — Z66 Do not resuscitate: Secondary | ICD-10-CM | POA: Diagnosis present

## 2018-02-07 DIAGNOSIS — I129 Hypertensive chronic kidney disease with stage 1 through stage 4 chronic kidney disease, or unspecified chronic kidney disease: Secondary | ICD-10-CM | POA: Diagnosis present

## 2018-02-07 DIAGNOSIS — I959 Hypotension, unspecified: Secondary | ICD-10-CM | POA: Diagnosis present

## 2018-02-07 DIAGNOSIS — R7989 Other specified abnormal findings of blood chemistry: Secondary | ICD-10-CM | POA: Diagnosis not present

## 2018-02-07 DIAGNOSIS — S4992XA Unspecified injury of left shoulder and upper arm, initial encounter: Secondary | ICD-10-CM | POA: Diagnosis not present

## 2018-02-07 DIAGNOSIS — G253 Myoclonus: Secondary | ICD-10-CM | POA: Diagnosis not present

## 2018-02-07 DIAGNOSIS — R69 Illness, unspecified: Secondary | ICD-10-CM | POA: Diagnosis not present

## 2018-02-07 DIAGNOSIS — R011 Cardiac murmur, unspecified: Secondary | ICD-10-CM | POA: Diagnosis present

## 2018-02-07 DIAGNOSIS — Z9842 Cataract extraction status, left eye: Secondary | ICD-10-CM | POA: Diagnosis not present

## 2018-02-07 DIAGNOSIS — H9190 Unspecified hearing loss, unspecified ear: Secondary | ICD-10-CM

## 2018-02-07 DIAGNOSIS — R52 Pain, unspecified: Secondary | ICD-10-CM | POA: Diagnosis present

## 2018-02-07 DIAGNOSIS — F0390 Unspecified dementia without behavioral disturbance: Secondary | ICD-10-CM | POA: Diagnosis present

## 2018-02-07 DIAGNOSIS — R778 Other specified abnormalities of plasma proteins: Secondary | ICD-10-CM | POA: Diagnosis present

## 2018-02-07 DIAGNOSIS — H5461 Unqualified visual loss, right eye, normal vision left eye: Secondary | ICD-10-CM | POA: Diagnosis present

## 2018-02-07 DIAGNOSIS — L8990 Pressure ulcer of unspecified site, unspecified stage: Secondary | ICD-10-CM

## 2018-02-07 DIAGNOSIS — N179 Acute kidney failure, unspecified: Secondary | ICD-10-CM | POA: Diagnosis present

## 2018-02-07 DIAGNOSIS — S40012A Contusion of left shoulder, initial encounter: Secondary | ICD-10-CM | POA: Diagnosis present

## 2018-02-07 DIAGNOSIS — D696 Thrombocytopenia, unspecified: Secondary | ICD-10-CM | POA: Diagnosis present

## 2018-02-07 DIAGNOSIS — F0150 Vascular dementia without behavioral disturbance: Secondary | ICD-10-CM | POA: Diagnosis not present

## 2018-02-07 DIAGNOSIS — Z681 Body mass index (BMI) 19 or less, adult: Secondary | ICD-10-CM | POA: Diagnosis not present

## 2018-02-07 DIAGNOSIS — S199XXA Unspecified injury of neck, initial encounter: Secondary | ICD-10-CM | POA: Diagnosis not present

## 2018-02-07 DIAGNOSIS — Z87891 Personal history of nicotine dependence: Secondary | ICD-10-CM

## 2018-02-07 DIAGNOSIS — M25511 Pain in right shoulder: Secondary | ICD-10-CM | POA: Diagnosis not present

## 2018-02-07 DIAGNOSIS — I469 Cardiac arrest, cause unspecified: Secondary | ICD-10-CM | POA: Diagnosis not present

## 2018-02-07 DIAGNOSIS — M810 Age-related osteoporosis without current pathological fracture: Secondary | ICD-10-CM | POA: Diagnosis present

## 2018-02-07 DIAGNOSIS — Y92009 Unspecified place in unspecified non-institutional (private) residence as the place of occurrence of the external cause: Secondary | ICD-10-CM | POA: Diagnosis not present

## 2018-02-07 DIAGNOSIS — Z79899 Other long term (current) drug therapy: Secondary | ICD-10-CM

## 2018-02-07 DIAGNOSIS — S299XXA Unspecified injury of thorax, initial encounter: Secondary | ICD-10-CM | POA: Diagnosis not present

## 2018-02-07 DIAGNOSIS — K219 Gastro-esophageal reflux disease without esophagitis: Secondary | ICD-10-CM | POA: Diagnosis not present

## 2018-02-07 DIAGNOSIS — L89611 Pressure ulcer of right heel, stage 1: Secondary | ICD-10-CM | POA: Diagnosis present

## 2018-02-07 DIAGNOSIS — E1122 Type 2 diabetes mellitus with diabetic chronic kidney disease: Secondary | ICD-10-CM | POA: Diagnosis present

## 2018-02-07 DIAGNOSIS — E860 Dehydration: Secondary | ICD-10-CM | POA: Diagnosis present

## 2018-02-07 DIAGNOSIS — Z8249 Family history of ischemic heart disease and other diseases of the circulatory system: Secondary | ICD-10-CM

## 2018-02-07 DIAGNOSIS — Z90710 Acquired absence of both cervix and uterus: Secondary | ICD-10-CM

## 2018-02-07 DIAGNOSIS — E119 Type 2 diabetes mellitus without complications: Secondary | ICD-10-CM | POA: Diagnosis not present

## 2018-02-07 DIAGNOSIS — L89152 Pressure ulcer of sacral region, stage 2: Secondary | ICD-10-CM | POA: Diagnosis present

## 2018-02-07 DIAGNOSIS — Z79890 Hormone replacement therapy (postmenopausal): Secondary | ICD-10-CM

## 2018-02-07 DIAGNOSIS — E440 Moderate protein-calorie malnutrition: Secondary | ICD-10-CM | POA: Diagnosis present

## 2018-02-07 DIAGNOSIS — Z515 Encounter for palliative care: Secondary | ICD-10-CM | POA: Diagnosis not present

## 2018-02-07 DIAGNOSIS — I361 Nonrheumatic tricuspid (valve) insufficiency: Secondary | ICD-10-CM | POA: Diagnosis not present

## 2018-02-07 DIAGNOSIS — M25512 Pain in left shoulder: Secondary | ICD-10-CM | POA: Diagnosis not present

## 2018-02-07 LAB — COMPREHENSIVE METABOLIC PANEL
ALT: 55 U/L — AB (ref 0–44)
AST: 165 U/L — ABNORMAL HIGH (ref 15–41)
Albumin: 3.4 g/dL — ABNORMAL LOW (ref 3.5–5.0)
Alkaline Phosphatase: 79 U/L (ref 38–126)
Anion gap: 13 (ref 5–15)
BUN: 42 mg/dL — ABNORMAL HIGH (ref 8–23)
CO2: 22 mmol/L (ref 22–32)
CREATININE: 1.82 mg/dL — AB (ref 0.44–1.00)
Calcium: 8.6 mg/dL — ABNORMAL LOW (ref 8.9–10.3)
Chloride: 103 mmol/L (ref 98–111)
GFR calc Af Amer: 27 mL/min — ABNORMAL LOW (ref >60)
GFR, EST NON AFRICAN AMERICAN: 23 mL/min — AB (ref >60)
Glucose, Bld: 122 mg/dL — ABNORMAL HIGH (ref 70–99)
Potassium: 3.8 mmol/L (ref 3.5–5.1)
Sodium: 138 mmol/L (ref 135–145)
Total Bilirubin: 0.7 mg/dL (ref 0.3–1.2)
Total Protein: 6.4 g/dL — ABNORMAL LOW (ref 6.5–8.1)

## 2018-02-07 LAB — CBC WITH DIFFERENTIAL/PLATELET
Abs Immature Granulocytes: 0.05 10*3/uL (ref 0.00–0.07)
Basophils Absolute: 0 10*3/uL (ref 0.0–0.1)
Basophils Relative: 0 %
EOS PCT: 0 %
Eosinophils Absolute: 0 10*3/uL (ref 0.0–0.5)
HCT: 31.8 % — ABNORMAL LOW (ref 36.0–46.0)
Hemoglobin: 9.9 g/dL — ABNORMAL LOW (ref 12.0–15.0)
Immature Granulocytes: 0 %
Lymphocytes Relative: 9 %
Lymphs Abs: 1.1 10*3/uL (ref 0.7–4.0)
MCH: 28.6 pg (ref 26.0–34.0)
MCHC: 31.1 g/dL (ref 30.0–36.0)
MCV: 91.9 fL (ref 80.0–100.0)
Monocytes Absolute: 0.8 10*3/uL (ref 0.1–1.0)
Monocytes Relative: 7 %
Neutro Abs: 9.9 10*3/uL — ABNORMAL HIGH (ref 1.7–7.7)
Neutrophils Relative %: 84 %
Platelets: 166 10*3/uL (ref 150–400)
RBC: 3.46 MIL/uL — ABNORMAL LOW (ref 3.87–5.11)
RDW: 14.1 % (ref 11.5–15.5)
WBC: 11.9 10*3/uL — ABNORMAL HIGH (ref 4.0–10.5)
nRBC: 0 % (ref 0.0–0.2)

## 2018-02-07 LAB — CK: Total CK: 1945 U/L — ABNORMAL HIGH (ref 38–234)

## 2018-02-07 LAB — URINALYSIS, ROUTINE W REFLEX MICROSCOPIC
Bilirubin Urine: NEGATIVE
Glucose, UA: NEGATIVE mg/dL
KETONES UR: NEGATIVE mg/dL
NITRITE: NEGATIVE
PROTEIN: 30 mg/dL — AB
Specific Gravity, Urine: 1.013 (ref 1.005–1.030)
WBC, UA: >50 WBC/hpf — ABNORMAL HIGH (ref 0–5)
pH: 5 (ref 5.0–8.0)

## 2018-02-07 LAB — I-STAT TROPONIN, ED: Troponin i, poc: 11.07 ng/mL (ref 0.00–0.08)

## 2018-02-07 LAB — TROPONIN I: Troponin I: 10.46 ng/mL (ref <0.03)

## 2018-02-07 LAB — POC OCCULT BLOOD, ED: Fecal Occult Bld: NEGATIVE

## 2018-02-07 MED ORDER — LEVOTHYROXINE SODIUM 100 MCG PO TABS
100.0000 ug | ORAL_TABLET | Freq: Every day | ORAL | Status: DC
  Administered 2018-02-08: 100 ug via ORAL
  Filled 2018-02-07: qty 1

## 2018-02-07 MED ORDER — SODIUM CHLORIDE 0.9% FLUSH
3.0000 mL | Freq: Two times a day (BID) | INTRAVENOUS | Status: DC
  Administered 2018-02-07 – 2018-02-08 (×2): 3 mL via INTRAVENOUS

## 2018-02-07 MED ORDER — FLUTICASONE PROPIONATE 50 MCG/ACT NA SUSP: 2.0000 | Freq: Every day | NASAL | Status: DC | PRN

## 2018-02-07 MED ORDER — POLYVINYL ALCOHOL 1.4 % OP SOLN
1.0000 [drp] | Freq: Two times a day (BID) | OPHTHALMIC | Status: DC
  Administered 2018-02-07 – 2018-02-08 (×3): 1 [drp] via OPHTHALMIC
  Filled 2018-02-07: qty 15

## 2018-02-07 MED ORDER — ACETAMINOPHEN 325 MG PO TABS: 650.0000 mg | ORAL_TABLET | Freq: Four times a day (QID) | ORAL | Status: DC | PRN

## 2018-02-07 MED ORDER — POLYETHYL GLYCOL-PROPYL GLYCOL 0.4-0.3 % OP SOLN: 1.0000 [drp] | Freq: Two times a day (BID) | OPHTHALMIC | Status: DC

## 2018-02-07 MED ORDER — ONDANSETRON HCL 4 MG/2ML IJ SOLN: 4.0000 mg | Freq: Four times a day (QID) | INTRAMUSCULAR | Status: DC | PRN

## 2018-02-07 MED ORDER — SODIUM CHLORIDE 0.9 % IV BOLUS
1000.0000 mL | Freq: Once | INTRAVENOUS | Status: AC
  Administered 2018-02-07: 1000 mL via INTRAVENOUS

## 2018-02-07 MED ORDER — HYDROMORPHONE HCL 1 MG/ML IJ SOLN
0.2500 mg | INTRAMUSCULAR | Status: DC | PRN
  Administered 2018-02-07 – 2018-02-09 (×6): 0.5 mg via INTRAVENOUS
  Filled 2018-02-07 (×7): qty 1

## 2018-02-07 MED ORDER — PANTOPRAZOLE SODIUM 40 MG PO TBEC
40.0000 mg | DELAYED_RELEASE_TABLET | Freq: Every day | ORAL | Status: DC
  Administered 2018-02-08: 40 mg via ORAL
  Filled 2018-02-07: qty 1

## 2018-02-07 MED ORDER — SODIUM CHLORIDE 0.9 % IV BOLUS
500.0000 mL | Freq: Once | INTRAVENOUS | Status: AC
  Administered 2018-02-07: 500 mL via INTRAVENOUS

## 2018-02-07 MED ORDER — SODIUM CHLORIDE 0.9% FLUSH: 9.0000 mL | INTRAVENOUS | Status: DC | PRN

## 2018-02-07 MED ORDER — ACETAMINOPHEN 650 MG RE SUPP: 650.0000 mg | Freq: Four times a day (QID) | RECTAL | Status: DC | PRN

## 2018-02-07 MED ORDER — SENNOSIDES-DOCUSATE SODIUM 8.6-50 MG PO TABS: 1.0000 | ORAL_TABLET | Freq: Every evening | ORAL | Status: DC | PRN

## 2018-02-07 MED ORDER — LORATADINE 10 MG PO TABS: 10.0000 mg | ORAL_TABLET | Freq: Every day | ORAL | Status: DC | PRN

## 2018-02-07 MED ORDER — DIPHENHYDRAMINE HCL 50 MG/ML IJ SOLN: 12.5000 mg | Freq: Four times a day (QID) | INTRAMUSCULAR | Status: DC | PRN

## 2018-02-07 MED ORDER — HYDROMORPHONE 1 MG/ML IV SOLN: INTRAVENOUS | Status: DC

## 2018-02-07 MED ORDER — FENTANYL CITRATE (PF) 100 MCG/2ML IJ SOLN
50.0000 ug | Freq: Once | INTRAMUSCULAR | Status: AC
  Administered 2018-02-07: 50 ug via INTRAVENOUS
  Filled 2018-02-07: qty 2

## 2018-02-07 MED ORDER — BRIMONIDINE TARTRATE 0.15 % OP SOLN
1.0000 [drp] | Freq: Two times a day (BID) | OPHTHALMIC | Status: DC
  Administered 2018-02-07 – 2018-02-08 (×3): 1 [drp] via OPHTHALMIC
  Filled 2018-02-07: qty 5

## 2018-02-07 MED ORDER — DIPHENHYDRAMINE HCL 12.5 MG/5ML PO ELIX: 12.5000 mg | ORAL_SOLUTION | Freq: Four times a day (QID) | ORAL | Status: DC | PRN

## 2018-02-07 MED ORDER — THIAMINE HCL 100 MG/ML IJ SOLN
100.0000 mg | Freq: Once | INTRAMUSCULAR | Status: AC
  Administered 2018-02-07: 100 mg via INTRAVENOUS
  Filled 2018-02-07: qty 2

## 2018-02-07 MED ORDER — CALCIUM CARBONATE ANTACID 500 MG PO CHEW: 1.0000 | CHEWABLE_TABLET | Freq: Two times a day (BID) | ORAL | Status: DC | PRN

## 2018-02-07 MED ORDER — ASPIRIN 300 MG RE SUPP
300.0000 mg | Freq: Once | RECTAL | Status: AC
  Administered 2018-02-07: 300 mg via RECTAL
  Filled 2018-02-07: qty 1

## 2018-02-07 MED ORDER — BISACODYL 10 MG RE SUPP: 10.0000 mg | Freq: Every day | RECTAL | Status: DC | PRN

## 2018-02-07 MED ORDER — NALOXONE HCL 0.4 MG/ML IJ SOLN: 0.4000 mg | INTRAMUSCULAR | Status: DC | PRN

## 2018-02-07 MED ORDER — SODIUM CHLORIDE 0.9 % IV SOLN
INTRAVENOUS | Status: DC
  Administered 2018-02-07: 18:00:00 via INTRAVENOUS

## 2018-02-07 MED ORDER — SODIUM CHLORIDE 0.9 % IV SOLN
500.0000 mg | Freq: Once | INTRAVENOUS | Status: AC
  Administered 2018-02-07: 500 mg via INTRAVENOUS
  Filled 2018-02-07 (×2): qty 2

## 2018-02-07 NOTE — ED Notes (Signed)
DNR armband placed L arm

## 2018-02-07 NOTE — Plan of Care (Signed)

## 2018-02-07 NOTE — ED Notes (Signed)
I Stat Trop I result of 11.07 reported to Dr. Ethelda ChickJacubowitz.

## 2018-02-07 NOTE — ED Triage Notes (Signed)
Pt from home; pt lives alone, daughter spoke w/ her Tuesday afternoon, daughter unable to reach her again until Thursday morning after doing a wellness check; pt found by granddaughter on floor, no memory of how she got there; pt normally ambulatory w/ cane, however she is unable to walk today; pt's family also endorses that pt has had diarrhea since yesterday afternoon

## 2018-02-07 NOTE — ED Notes (Signed)
Pt in and out cathed; tolerated well

## 2018-02-07 NOTE — Telephone Encounter (Signed)
Incoming call from Patients' daughter [place]  Stating that she found her Mother on the floor Thursday am.  Patient lives alone.  Has a medical alert bracelet but did not have it on. Daughter states that patient was incontinent when she found her.  Daughter has no idea how long Patient had been on the floor.  Daughter reoprts  bruising on shoulders, arms lower and back.  Patient complains when she attempts to raise her arms.  Reviewed protocol with Patient which recommended Patient go to ED or Urgent care. Provided Care Advice, daughter voiced understanding.  Daughter states that she will call the fire department to assist with putting Patient in the car then drive her to an Urgent Care.    Reason for Disposition . Can't move injured arm at all  Answer Assessment - Initial Assessment Questions 1. MECHANISM: "How did the injury happen?"      fall 2. ONSET: "When did the injury happen?" (Minutes or hours ago)      Wednesday night 3. LOCATION: "Where is the injury located?"      Arms, shoulders tailbone 4. APPEARANCE of INJURY: "What does the injury look like?"      Black and blue  5. SEVERITY: "Can you use the arm normally?"      Cant lift her arms cant riase arms  6. SWELLING or BRUISING: "is there any swelling or bruising?" If so, ask: "How large is it? (e.g., inches, centimeters)      bruising 7. PAIN: "Is there pain?" If so, ask: "How bad is the pain?"    (Scale 1-10; or mild, moderate, severe)     7 to 8 hurts to move 8. TETANUS: For any breaks in the skin, ask: "When was the last tetanus booster?"     na 9. OTHER SYMPTOMS: "Do you have any other symptoms?"  (e.g., numbness in hand)     Na  10. PREGNANCY: "Is there any chance you are pregnant?" "When was your last menstrual period?"        na  Protocols used: ARM INJURY-A-AH

## 2018-02-07 NOTE — Consult Note (Addendum)
Cardiology Consultation:   Desiree Desiree; DOB: 1920-03-25  Admit date: 02-22-18 Date of Consult: 22-Feb-2018  Primary Care Provider: Pincus Sanes, [name] Primary Cardiologist: new    Desiree Profile:   Desiree Desiree is a 84 y.o. female with a hx of hypertension and diet-controlled diabetes and dementia who is being seen today for Desiree evaluation of elevated troponin I at Desiree request of Dr. Rennis [name].  History of Present Illness:   Desiree Desiree is a 84 year old female with a history of hypertension, diet-controlled diabetes, and dementia.  History is obtained from her daughter and granddaughter.  Desiree Desiree has a poor short-term memory.  She does live in her own home with help from her daughter and granddaughter.  Desiree family says she has not been eating well lately.  They have a system set up for her medications and they believe she has been taking her medications.  Desiree Desiree has had no prior history of any cardiac issues.  She was checked on Wednesday and was doing well.  When they came by Thanksgiving day to check on her she was on Desiree floor.  She had a pop apparently fallen Wednesday evening.  They evaluated her briefly at home and Desiree appeared to be fine no complaints of pain or chest pain or shortness of breath.  Today Desiree Desiree is complaining of pain especially on her right arm and back.  They brought her to Desiree emergency room for further evaluation.  In Desiree emergency room a troponin was obtained and is 11.  Her EKG shows sinus rhythm with deep T wave inversion in Desiree inferior leads and V3 through V6.  This is new compared to Desiree only other EKG I can find which was from 2013.  She does appear to have LVH by voltage as well.  Desiree Desiree denies any chest pain now, she complains of her right arm hurting.  Past Medical History:  Diagnosis Date  . Allergic rhinitis   . Anemia   . Blood transfusion   . Dementia (HCC)   . Diabetes mellitus    type  II-  diet controlled  . GERD (gastroesophageal reflux disease)   . Glaucoma    bilateral  . Hard of hearing   . Hyperlipidemia   . Hypertension   . Hypothyroidism   . Macular degeneration   . Osteoarthritis    hands  . Osteoporosis   . PONV (postoperative nausea and vomiting)   . Rectal prolapse   . Urinary incontinence    stress  . Vision loss of right eye     Past Surgical History:  Procedure Laterality Date  . ABDOMINAL HYSTERECTOMY     nonmalignant reasons  . CARPAL TUNNEL RELEASE     bilateral  . CATARACT EXTRACTION  2006   left  . LUMBAR FUSION  3-01   spinal cord leak- requiring 2 month admission  . right eye surgery  7-00  . VEIN LIGATION AND STRIPPING     left leg     Home Medications:  Prior to Admission medications   Medication Sig Start Date End Date Taking? Authorizing Provider  amLODipine (NORVASC) 10 MG tablet Take 1 tablet (10 mg total) by mouth daily with breakfast. 11/25/17  Yes Desiree Desiree  aspirin 81 MG tablet Take 81 mg by mouth at bedtime.    Yes Provider, Historical, [name]  brimonidine (ALPHAGAN) 0.15 % ophthalmic solution Place 1 drop into Desiree left eye 2 (two)  times daily. 11/25/17  Yes Desiree Desiree  calcium carbonate (TUMS) 500 MG chewable tablet Chew 1 tablet by mouth 2 (two) times daily as needed for indigestion or heartburn.    Yes Provider, Historical, [name]  fluticasone (FLONASE) 50 MCG/ACT nasal spray Place 2 sprays into both nostrils daily. 11/25/17  Yes Desiree Desiree  hydrochlorothiazide (HYDRODIURIL) 50 MG tablet Take 1 tablet (50 mg total) by mouth daily. 11/25/17  Yes Desiree Desiree  levothyroxine (SYNTHROID, LEVOTHROID) 100 MCG tablet Take 1 tablet (100 mcg total) by mouth daily before breakfast. 11/25/17  Yes Desiree Desiree  loratadine (CLARITIN) 10 MG tablet Take 10 mg by mouth daily as needed for allergies.   Yes Provider, Historical, [name]  losartan (COZAAR) 100 MG tablet Take 1 tablet (100 mg total) by mouth daily. 11/25/17   Yes Desiree Desiree  multivitamin [facility]) per tablet Take 1 tablet by mouth daily.    Yes Provider, Historical, [name]  omeprazole (PRILOSEC) 40 MG capsule Take 1 capsule (40 mg total) by mouth daily with lunch. Desiree taking differently: Take 40 mg by mouth daily.  11/25/17  Yes Desiree Desiree  Polyethyl Glycol-Propyl Glycol (SYSTANE) 0.4-0.3 % SOLN Place 1 drop into both eyes 2 (two) times daily.   Yes Provider, Historical, [name]  potassium chloride (KLOR-CON M10) 10 MEQ tablet Take 1 tablet (10 mEq total) by mouth daily. 11/25/17  Yes Desiree Desiree  vitamin C (ASCORBIC ACID) 500 MG tablet Take 1,000 mg by mouth daily.   Yes Provider, Historical, [name]  vitamin E (VITAMIN E) 1000 UNIT capsule Take 1,000 Units by mouth daily.    Yes Provider, Historical, [name]    Inpatient Medications: Scheduled Meds:  Continuous Infusions:  PRN Meds:   Allergies:    Allergies  Allergen Reactions  . Ceftriaxone Other (See Comments)    Bioxon makes her skin crawl  . Lactose Intolerance (Gi) Other (See Comments)    Gi upset  . Rofecoxib Other (See Comments)    Vioxx caused gi issues  . Sulfur Hives    Social History:   Social History   Socioeconomic History  . Marital status: Widowed    Spouse name: Not on file  . Number of children: Not on file  . Years of education: Not on file  . Highest education level: Not on file  Occupational History  . Occupation: homemaker  Social Needs  . Financial resource strain: Not on file  . Food insecurity:    Worry: Not on file    Inability: Not on file  . Transportation needs:    Medical: Not on file    Non-medical: Not on file  Tobacco Use  . Smoking status: Former Smoker    Last attempt to quit: 09/28/1973    Years since quitting: 44.3  . Smokeless tobacco: Never Used  Substance and Sexual Activity  . Alcohol use: No  . Drug use: No  . Sexual activity: Never  Lifestyle  . Physical activity:    Days per week: Not on file    Minutes per  session: Not on file  . Stress: Not on file  Relationships  . Social connections:    Talks on phone: Not on file    Gets together: Not on file    Attends religious service: Not on file    Active member of club or organization: Not on file    Attends meetings of clubs or organizations: Not on  file    Relationship status: Not on file  . Intimate partner violence:    Fear of current or ex partner: Not on file    Emotionally abused: Not on file    Physically abused: Not on file    Forced sexual activity: Not on file  Other Topics Concern  . Not on file  Social History Narrative   married 30 years -divorced; remarried 66 -widowed '83 . 1 daughter 1 son. 1 grandchild, 2 great grandchildren. 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    Terminal care issues: Desiree clearly states, daughter present, that she would not want heroic or futile care, e.g. CPR, mechanical vent. support, etc. DNR/DNI, no HEROICs    Family History:   Family History  Problem Relation Age of Onset  . Other Mother        CVA/ grief  . Heart disease Mother   . Heart attack Father   . Heart disease Father   . Heart attack Brother 47  . Cancer Neg Hx        breast or colon     ROS:  Please see Desiree history of present illness.  All other ROS reviewed and negative.     Physical Exam/Data:   Vitals:   01/29/2018 1345 02/06/2018 1400 01/18/2018 1428 02/01/2018 1445  BP: (!) 79/45 (!) 80/58 (!) 90/57 (!) 91/53  Pulse: 77 79 77 79  Resp: 20 (!) 23 17 (!) 21  Temp:      TempSrc:      SpO2: 99% 100% 100% 99%  Weight:      Height:        Intake/Output Summary (Last 24 hours) at 01/22/2018 1525 Last data filed at 01/25/2018 1501 Gross per 24 hour  Intake 1000 ml  Output -  Net 1000 ml   Filed Weights   01/12/2018 1319  Weight: 46.7 kg   Body mass index is 19.46 kg/m.  General:  Elderly, frail Caucasian female, no acute distress but complaining of Rt arm pain HEENT:   Hearing aids in place Neck: no JVD, transmitted murmur Rt carotid Endocrine:  No thryomegaly Vascular: diminished distal pulses Cardiac:  RRR 2/6 systolic murmur AOV, LSB, preserved S2  Lungs:  clear to auscultation bilaterally, no wheezing, rhonchi or rales  Abd: soft, nontender, no hepatomegaly  Ext: no edema, ecchymosis Rt elbow Skin: warm, pale, and dry  Neuro:  CNs 2-12 intact, no focal abnormalities noted   EKG:  Desiree EKG was personally reviewed and demonstrates:  NSR, LVH, TWI 2,3,AVF, V3-V6  Laboratory Data:  Chemistry Recent Labs  Lab 01/19/2018 1332  NA 138  K 3.8  CL 103  CO2 22  GLUCOSE 122*  BUN 42*  CREATININE 1.82*  CALCIUM 8.6*  GFRNONAA 23*  GFRAA 27*  ANIONGAP 13    Recent Labs  Lab 01/28/2018 1332  PROT 6.4*  ALBUMIN 3.4*  AST 165*  ALT 55*  ALKPHOS 79  BILITOT 0.7   Hematology Recent Labs  Lab 01/18/2018 1332  WBC 11.9*  RBC 3.46*  HGB 9.9*  HCT 31.8*  MCV 91.9  MCH 28.6  MCHC 31.1  RDW 14.1  PLT 166   Cardiac EnzymesNo results for input(s): TROPONINI in Desiree last 168 hours.  Recent Labs  Lab 02/01/2018 1340  TROPIPOC 11.07*    BNPNo results for input(s): BNP, PROBNP in Desiree last 168 hours.  DDimer No results for input(s): DDIMER in Desiree last 168 hours.  Radiology/Studies:  Dg Shoulder Right  Result Date: 2018/01/14 CLINICAL DATA:  Shoulder pain EXAM: RIGHT SHOULDER - 2+ VIEW COMPARISON:  None. FINDINGS: Marked AC joint degenerative change. High-riding humeral head consistent with rotator cuff disease. No fracture or dislocation. IMPRESSION: 1. No acute osseous abnormality. 2. Prominent degenerative changes. High-riding humeral head consistent with rotator cuff disease. Electronically Signed   By: Jasmine PangKim  Fujinaga M.D.   On: 2018/01/14 14:58   Dg Chest Port 1 View  Result Date: 2018/01/14 CLINICAL DATA:  Hypotension, fall EXAM: PORTABLE CHEST 1 VIEW COMPARISON:  10/10/2011 FINDINGS: No focal opacity or pleural effusion. Stable  cardiomediastinal silhouette. No pneumothorax. IMPRESSION: No active disease. Electronically Signed   By: Jasmine PangKim  Fujinaga M.D.   On: 2018/01/14 14:56   Dg Shoulder Left  Result Date: 2018/01/14 CLINICAL DATA:  Shoulder pain after fall EXAM: LEFT SHOULDER - 2+ VIEW COMPARISON:  None. FINDINGS: Moderate AC joint degenerative change. High-riding humeral head consistent with rotator cuff disease. Moderate degenerative change of Desiree glenohumeral interval. Left lung apex is clear. No fracture or dislocation IMPRESSION: 1. No acute osseous abnormality. 2. High-riding humeral head consistent with rotator cuff disease. Degenerative changes [facility] and glenohumeral interval Electronically Signed   By: Jasmine PangKim  Fujinaga M.D.   On: 2018/01/14 14:57    Assessment and Plan:   Elevated Troponin-we will continue to cycle enzymes.  Her troponin is possibly elevated from rhabdomyolysis from her fall, her CK is noted to be 1945  Renal insufficiency-she appears dry, her BUN is 42 creatinine 1.82 this would go along with her history of poor intake and a fall at home being down for 12 hours at least.  Anemia-her hemoglobin is 9.9.  She is dehydrated and may be significantly more anemic which may become apparent when she is hydrated.  Her white count is also drifted up with a left shift.  HTN-prior to admission Desiree Desiree was on losartan 100 mg hydrochlorothiazide 50 mg and amlodipine 10 mg.  Dementia-Desiree Desiree was unable to give me much history.  Desiree Desiree's daughter confirmed that she is a DNR and has paperwork to this effect.  They agree that aggressive or invasive therapy is not indicated.  Plan- hydrate, cycle troponins, check echo. [name] to see.      For questions or updates, please contact [facility] Please consult [email or web address] for contact info under     Signed, Corine ShelterLuke Kilroy, PA-C  2018/01/14 3:25 PM   I have examined Desiree Desiree and reviewed assessment and plan and discussed with Desiree.   Agree with above as stated.    Desiree is hard of hearing.  About to have CT scan.    Not reporting chest pain.  Conservative therapy at this time.  She is being evaluated for injuries from her fall, so would therefore, not heparinize.   Palliative care consult may be reasonable.  Agree with DNR status as she would not do well with CPR.   Lance MussJayadeep Varanasi

## 2018-02-07 NOTE — ED Notes (Signed)
Attempted report 

## 2018-02-07 NOTE — H&P (Signed)
History and Physical   Desiree [name] WUJ:[id number] DOB: June 04, 1920 DOA: 24-Feb-2018  Referring MD/NP/PA: Dr. Felix Pacini, EDP PCP: Pincus Sanes, MD  Patient coming from: Home  Chief Complaint: Weakness, fall at home  HPI: Desiree [name] is a 84 y.o. female with a history of HTN, diet-controlled T2DM, hyperlipidemia, hypothyroidism, GERD, moderate malnutrition and dementia who lives at home and is brought in by family for weakness and pain following an unwitnessed fall at home. She was found down yesterday morning by her granddaughter, was oriented, and able to walk with slightly more assistance than usual, using a walker when she usually needs only a cane. She complained of some shoulder pain and had some bruising, but went to thanksgiving dinner that night, though she didn't eat very much because she was unable to lift her shoulders due to pain. Her weakness continued to the point that she was unable to assist getting up much at all this morning, but was able to eat some breakfast and take her BP and other medications. The pain in the shoulders was constant, severe, worse with any movement, radiating down the back, and only very modestly improved with fentanyl in the ED.   History is obtained from daughter and granddaughter predominantly due to patient's pain/distress and dementia.    ED Course: She was hypotensive, in evident pain. valuation revealed NSTEMI w/deep T wave inversions, troponin 11, ARF and rhabdomyolysis with Cr up to 1.82 (CrCl only 25ml/min) and CK 1,945. CT imaging of the head, cervical spine, abdomen, and pelvis as well as plain films of the shoulders were performed. 1.5L NS boluses given with modest improvement in blood pressures. Ampicillin started for pyuria with culture pending. Cardiology consulted, recommending following troponins, echocardiogram, aspirin and no heparin at this time. Discussions between the family, including HCPOA, and EDP as well as admitting MD have been  in line with her previously professed wishes against heroic efforts. They would not want pressors, attempted resuscitation. Their primary concern is getting her out of the pain she's currently in, even if this risks sedation.  Review of Systems: No fever, chills, and per HPI. All others reviewed and are negative.   Past Medical History:  Diagnosis Date  . Allergic rhinitis   . Anemia   . Blood transfusion   . Dementia (HCC)   . Diabetes mellitus    type  II- diet controlled  . GERD (gastroesophageal reflux disease)   . Glaucoma    bilateral  . Hard of hearing   . Hyperlipidemia   . Hypertension   . Hypothyroidism   . Macular degeneration   . Osteoarthritis    hands  . Osteoporosis   . PONV (postoperative nausea and vomiting)   . Rectal prolapse   . Urinary incontinence    stress  . Vision loss of right eye    Past Surgical History:  Procedure Laterality Date  . ABDOMINAL HYSTERECTOMY     nonmalignant reasons  . CARPAL TUNNEL RELEASE     bilateral  . CATARACT EXTRACTION  2006   left  . LUMBAR FUSION  3-01   spinal cord leak- requiring 2 month admission  . right eye surgery  7-00  . VEIN LIGATION AND STRIPPING     left leg   - Remote smoker, lives alone.   reports that she quit smoking about 44 years ago. She has never used smokeless tobacco. She reports that she does not drink alcohol or use drugs. Allergies  Allergen Reactions  .  Ceftriaxone Other (See Comments)    Bioxon makes her skin crawl  . Lactose Intolerance (Gi) Other (See Comments)    Gi upset  . Rofecoxib Other (See Comments)    Vioxx caused gi issues  . Sulfur Hives   Family History  Problem Relation Age of Onset  . Other Mother        CVA/ grief  . Heart disease Mother   . Heart attack Father   . Heart disease Father   . Heart attack Brother 47  . Cancer Neg Hx        breast or colon   - Family history otherwise reviewed and not pertinent.  Prior to Admission medications   Medication Sig  Start Date End Date Taking? Authorizing Provider  amLODipine (NORVASC) 10 MG tablet Take 1 tablet (10 mg total) by mouth daily with breakfast. 11/25/17  Yes Burns, Bobette Mo, MD  aspirin 81 MG tablet Take 81 mg by mouth at bedtime.    Yes [provider]  brimonidine (ALPHAGAN) 0.15 % ophthalmic solution Place 1 drop into the left eye 2 (two) times daily. 11/25/17  Yes Burns, Bobette Mo, MD  calcium carbonate (TUMS) 500 MG chewable tablet Chew 1 tablet by mouth 2 (two) times daily as needed for indigestion or heartburn.    Yes [provider]  fluticasone (FLONASE) 50 MCG/ACT nasal spray Place 2 sprays into both nostrils daily. 11/25/17  Yes Burns, Bobette Mo, MD  hydrochlorothiazide (HYDRODIURIL) 50 MG tablet Take 1 tablet (50 mg total) by mouth daily. 11/25/17  Yes Burns, Bobette Mo, MD  levothyroxine (SYNTHROID, LEVOTHROID) 100 MCG tablet Take 1 tablet (100 mcg total) by mouth daily before breakfast. 11/25/17  Yes Burns, Bobette Mo, MD  loratadine (CLARITIN) 10 MG tablet Take 10 mg by mouth daily as needed for allergies.   Yes [provider]  losartan (COZAAR) 100 MG tablet Take 1 tablet (100 mg total) by mouth daily. 11/25/17  Yes Burns, Bobette Mo, MD  multivitamin [facility]) per tablet Take 1 tablet by mouth daily.    Yes [provider]  omeprazole (PRILOSEC) 40 MG capsule Take 1 capsule (40 mg total) by mouth daily with lunch. Patient taking differently: Take 40 mg by mouth daily.  11/25/17  Yes Burns, Bobette Mo, MD  Polyethyl Glycol-Propyl Glycol (SYSTANE) 0.4-0.3 % SOLN Place 1 drop into both eyes 2 (two) times daily.   Yes [provider]  potassium chloride (KLOR-CON M10) 10 MEQ tablet Take 1 tablet (10 mEq total) by mouth daily. 11/25/17  Yes Burns, Bobette Mo, MD  vitamin C (ASCORBIC ACID) 500 MG tablet Take 1,000 mg by mouth daily.   Yes [provider]  vitamin E (VITAMIN E) 1000 UNIT capsule Take 1,000 Units by mouth daily.    Yes [provider]     Physical Exam: Vitals:   03-01-18 1615 03/01/18 1630 03-01-2018 1700 2018/03/01 1730  BP: (!) 95/53 (!) 93/57 107/66 111/63  Pulse: 81 92 76 87  Resp: (!) 27 (!) 26 16 (!) 21  Temp:      TempSrc:      SpO2: 99% 97% 100% 99%  Weight:      Height:       Constitutional: Elderly female in apparent pain, calm demeanor Eyes: Lids and conjunctivae normal, PERRL ENMT: Mucous membranes are moist. Posterior pharynx clear of any exudate or lesions. Poor dentition.  Neck: normal, supple, no masses, no thyromegaly Respiratory: Non-labored breathing room air without accessory muscle use.  Clear breath sounds to auscultation bilaterally Cardiovascular: Regular rate and rhythm, II/VI systolic murmur, no rubs, or gallops. No carotid bruits. No JVD. No LE edema. 2+ pedal pulses. Abdomen: Normoactive bowel sounds. No tenderness, non-distended, and no masses palpated. No hepatosplenomegaly. GU: No indwelling catheter Musculoskeletal: No clubbing / cyanosis. Decreased muscle bulk, diminished ROM at shoulders, point tenderness to thoracic paraspinal musculature on right, some bruising on left posterior shoulder, some ttp. Sacrum with some point tenderness, no palpable deformity. No contractures.  Skin: Warm, dry. No rashes, wounds, or ulcers. No significant lesions noted.  Neurologic: CN II-XII grossly intact. Gait not assessed. Speech normal. No focal deficits in motor strength or sensation in all extremities on exam limited by pain.  Psychiatric: UTD  Labs on Admission: I have personally reviewed following labs and imaging studies  CBC: Recent Labs  Lab 01/17/2018 1332  WBC 11.9*  NEUTROABS 9.9*  HGB 9.9*  HCT 31.8*  MCV 91.9  PLT 166   Basic Metabolic Panel: Recent Labs  Lab 01/22/2018 1332  NA 138  K 3.8  CL 103  CO2 22  GLUCOSE 122*  BUN 42*  CREATININE 1.82*  CALCIUM 8.6*   GFR: Estimated Creatinine Clearance: 13.6 mL/min (A) (by C-G formula based on SCr of 1.82 mg/dL (H)). Liver  Function Tests: Recent Labs  Lab 01/14/2018 1332  AST 165*  ALT 55*  ALKPHOS 79  BILITOT 0.7  PROT 6.4*  ALBUMIN 3.4*   No results for input(s): LIPASE, AMYLASE in the last 168 hours. No results for input(s): AMMONIA in the last 168 hours. Coagulation Profile: No results for input(s): INR, PROTIME in the last 168 hours. Cardiac Enzymes: Recent Labs  Lab 01/12/2018 1332 01/31/2018 1532  CKTOTAL 1,945*  --   TROPONINI  --  10.46*   BNP (last 3 results) No results for input(s): PROBNP in the last 8760 hours. HbA1C: No results for input(s): HGBA1C in the last 72 hours. CBG: No results for input(s): GLUCAP in the last 168 hours. Lipid Profile: No results for input(s): CHOL, HDL, LDLCALC, TRIG, CHOLHDL, LDLDIRECT in the last 72 hours. Thyroid Function Tests: No results for input(s): TSH, T4TOTAL, FREET4, T3FREE, THYROIDAB in the last 72 hours. Anemia Panel: No results for input(s): VITAMINB12, FOLATE, FERRITIN, TIBC, IRON, RETICCTPCT in the last 72 hours. Urine analysis:    Component Value Date/Time   COLORURINE AMBER (A) 01/10/2018 1430   APPEARANCEUR CLOUDY (A) 01/18/2018 1430   LABSPEC 1.013 01/29/2018 1430   PHURINE 5.0 01/28/2018 1430   GLUCOSEU NEGATIVE 01/23/2018 1430   HGBUR SMALL (A) 01/16/2018 1430   BILIRUBINUR NEGATIVE 01/31/2018 1430   BILIRUBINUR Neg 04/19/2014 1443   KETONESUR NEGATIVE 01/17/2018 1430   PROTEINUR 30 (A) 01/16/2018 1430   UROBILINOGEN 0.2 04/19/2014 1443   UROBILINOGEN 0.2 09/28/2013 1153   NITRITE NEGATIVE 01/11/2018 1430   LEUKOCYTESUR LARGE (A) 01/25/2018 1430    No results found for this or any previous visit (from the past 240 hour(s)).   Radiological Exams on Admission: Ct Abdomen Pelvis Wo Contrast  Result Date: 01/12/2018 CLINICAL DATA:  Hypotension.  Found on floor.  Diarrhea. EXAM: CT ABDOMEN AND PELVIS WITHOUT CONTRAST TECHNIQUE: Multidetector CT imaging of the abdomen and pelvis was performed following the standard protocol  without IV contrast. COMPARISON:  None. FINDINGS: Lower chest: No acute abnormality. Normal heart size. Aortic atherosclerosis and 3 vessel coronary artery atherosclerotic calcifications. Hepatobiliary: No focal liver abnormality is seen. No gallstones, gallbladder wall thickening, or biliary dilatation. Pancreas: Unremarkable. No pancreatic  ductal dilatation or surrounding inflammatory changes. Spleen: Normal in size without focal abnormality. Adrenals/Urinary Tract: Adrenal glands are unremarkable. Kidneys are normal, without renal calculi, focal lesion, or hydronephrosis. Bladder is unremarkable. Stomach/Bowel: Stomach is within normal limits. Extensive colonic diverticulosis identified. No acute inflammation identified. Postoperative changes within the sigmoid colon identified. The appendix is visualized and appears normal. No evidence of bowel wall thickening, distention, or inflammatory changes. Vascular/Lymphatic: Aortic atherosclerosis. No aneurysm. No adenopathy within the abdomen or pelvis. Reproductive: Status post hysterectomy. No adnexal masses. Other: No free fluid or fluid collections. No abdominal wall hernia identified. Musculoskeletal: Spondylosis within the lower thoracic and lumbar spine. Status post posterior decompression and interbody fusion at L2-3, L3-4 and L4-5. Degenerative disc disease identified at the L1-2 and L5-S1. Chronic appearing right pubic rami fractures noted. IMPRESSION: 1. No acute findings within the abdomen or pelvis. 2.  Aortic Atherosclerosis (ICD10-I70.0). 3. Multi vessel coronary artery atherosclerotic calcifications. Electronically Signed   By: Signa Kellaylor  Stroud M.D.   On: 2017/08/25 16:09   Dg Shoulder Right  Result Date: 2017/08/25 CLINICAL DATA:  Shoulder pain EXAM: RIGHT SHOULDER - 2+ VIEW COMPARISON:  None. FINDINGS: Marked AC joint degenerative change. High-riding humeral head consistent with rotator cuff disease. No fracture or dislocation. IMPRESSION: 1. No  acute osseous abnormality. 2. Prominent degenerative changes. High-riding humeral head consistent with rotator cuff disease. Electronically Signed   By: Jasmine PangKim  Fujinaga M.D.   On: 2017/08/25 14:58   Ct Head Wo Contrast  Result Date: 2017/08/25 CLINICAL DATA:  Unwitnessed fall, altered level of consciousness. EXAM: CT HEAD WITHOUT CONTRAST CT CERVICAL SPINE WITHOUT CONTRAST TECHNIQUE: Multidetector CT imaging of the head and cervical spine was performed following the standard protocol without intravenous contrast. Multiplanar CT image reconstructions of the cervical spine were also generated. COMPARISON:  None. FINDINGS: CT HEAD FINDINGS Brain: Mild chronic ischemic white matter disease is noted. No mass effect or midline shift is noted. Ventricular size is within normal limits. There is no evidence of mass lesion, hemorrhage or acute infarction. Vascular: No hyperdense vessel or unexpected calcification. Skull: Normal. Negative for fracture or focal lesion. Sinuses/Orbits: No acute finding. Other: None. CT CERVICAL SPINE FINDINGS Alignment: Normal. Skull base and vertebrae: No acute fracture. No primary bone lesion or focal pathologic process. Soft tissues and spinal canal: No prevertebral fluid or swelling. No visible canal hematoma. Disc levels: Large bridging anterior osteophyte formation is seen throughout the cervical spine. Disc spaces are otherwise normal. Upper chest: Negative. Other: None. IMPRESSION: Mild chronic ischemic white matter disease. No acute intracranial abnormality seen. Extensive degenerative changes are noted in the cervical spine. No fracture or spondylolisthesis is noted. Electronically Signed   By: Lupita RaiderJames  Green Jr, M.D.   On: 2017/08/25 16:10   Ct Cervical Spine Wo Contrast  Result Date: 2017/08/25 CLINICAL DATA:  Unwitnessed fall, altered level of consciousness. EXAM: CT HEAD WITHOUT CONTRAST CT CERVICAL SPINE WITHOUT CONTRAST TECHNIQUE: Multidetector CT imaging of the head and  cervical spine was performed following the standard protocol without intravenous contrast. Multiplanar CT image reconstructions of the cervical spine were also generated. COMPARISON:  None. FINDINGS: CT HEAD FINDINGS Brain: Mild chronic ischemic white matter disease is noted. No mass effect or midline shift is noted. Ventricular size is within normal limits. There is no evidence of mass lesion, hemorrhage or acute infarction. Vascular: No hyperdense vessel or unexpected calcification. Skull: Normal. Negative for fracture or focal lesion. Sinuses/Orbits: No acute finding. Other: None. CT CERVICAL SPINE FINDINGS Alignment: Normal. Skull base  and vertebrae: No acute fracture. No primary bone lesion or focal pathologic process. Soft tissues and spinal canal: No prevertebral fluid or swelling. No visible canal hematoma. Disc levels: Large bridging anterior osteophyte formation is seen throughout the cervical spine. Disc spaces are otherwise normal. Upper chest: Negative. Other: None. IMPRESSION: Mild chronic ischemic white matter disease. No acute intracranial abnormality seen. Extensive degenerative changes are noted in the cervical spine. No fracture or spondylolisthesis is noted. Electronically Signed   By: Lupita Raider, M.D.   On: 03-02-2018 16:10   Dg Chest Port 1 View  Result Date: 03/02/2018 CLINICAL DATA:  Hypotension, fall EXAM: PORTABLE CHEST 1 VIEW COMPARISON:  10/10/2011 FINDINGS: No focal opacity or pleural effusion. Stable cardiomediastinal silhouette. No pneumothorax. IMPRESSION: No active disease. Electronically Signed   By: Jasmine Pang M.D.   On: 03-02-18 14:56   Dg Shoulder Left  Result Date: 2018/03/02 CLINICAL DATA:  Shoulder pain after fall EXAM: LEFT SHOULDER - 2+ VIEW COMPARISON:  None. FINDINGS: Moderate AC joint degenerative change. High-riding humeral head consistent with rotator cuff disease. Moderate degenerative change of the glenohumeral interval. Left lung apex is clear. No  fracture or dislocation IMPRESSION: 1. No acute osseous abnormality. 2. High-riding humeral head consistent with rotator cuff disease. Degenerative changes of the [facility] joint and glenohumeral interval Electronically Signed   By: Jasmine Pang M.D.   On: 03-02-2018 14:57    EKG: Independently reviewed. NSR with TWI in lateral and inferior leads.  Assessment/Plan Principal Problem:   Fall Active Problems:   Hypothyroidism   Diabetes (HCC)   Dementia (HCC)   Essential hypertension   GERD (gastroesophageal reflux disease)   Osteoarthritis   Malnutrition of moderate degree (HCC)   Elevated troponin level   HOH (hard of hearing)   Rhabdomyolysis   Intractable pain   Acute renal failure (HCC)   Fall at home with rhabdomyolysis and MSK pain: Unknown time down, negative imaging for fractures.  - Dilaudid low dose with increased frequency for dose finding. Could consider PCA if not controlling pain. The family and patient are aware that this risks sedation and in her condition this could cause hypoventilation which we will attempt to avoid, but remains a risk. - K pad prn - Continue IVF's and CK monitoring - Therapy evaluations pending clinical course  NSTEMI: Troponin plateaued, unknown timing of cardiac event, currently denying chest pain. - Cardiology consulted. Initial troponin trend is stable, will recheck in AM to minimize blood draws per GOC discussions.  - Not on heparin, was given ASA. - Echo - Not a candidate for cath and wouldn't be in goals of care, so will start diet   ARF: Due to dehydration, rhabdomyolysis, possibly ATN due to hypotension - IVF's to continue, recheck in AM - Avoid nephrotoxins. Use dilaudid instead of morphine.  Normocytic anemia: Anticipate hemodilutional effect with IVF's, so likely lower than 9.9 g/dl.  - Check in AM. FOBT neg, no definite bleeding on scans or exam  Leukocytosis: Due to UTI vs. reactive.  - Trend with above Tx  Pyuria:  - Continue  ampicillin (CTX allergy noted) 500mg  q12h - Monitor culture  Goals of care: After prolonged discussions with family, we have landed on a focus on comfort measures without withdrawal of the care that does not pose discomfort to the patient. This includes continuing heart monitoring and minimizing, but not discontinuing, blood draws. No SCDs or unnecessary injections (heparin). Ok to give abx. She would not want pressors or NIPPV, etc.  so can go to the floor. Will give opioids pain medications despite risks which were reviewed in detail.  - Palliative care consulted - Will monitor based on clinical progress  DVT prophylaxis: Comfort measures  Code Status: DNR  Family Communication: Daughter, granddaughter at bedside Disposition Plan: Uncertain Consults called: Palliative care in EMR  Admission status: Inpatient    Tyrone Nine, MD [facility] [email or web address] Password [email or web address] 02/02/2018, 5:40 PM

## 2018-02-07 NOTE — ED Provider Notes (Signed)
[facility] EMERGENCY DEPARTMENT Provider Note   CSN: [id number] Arrival date & time: 02/08/2018  1242   Level 5 caveat dementia  History   Chief Complaint Chief Complaint  Patient presents with  . Fall    HPI Desiree [name] is a 84 y.o. female.  History is obtained from patient's granddaughter and daughter.  Patient's daughter spoke with her on the telephone 3 nights ago.  She seemed fine.  Adult granddaughter went to patient's resident yesterday morning to find her lying on the floor.  She was awake and alert.  She reportedly had fallen.  It is unclear when she fell.  Yesterday she ate some dinner however she was unable to eat much because she had some trouble moving her arms.  Her gait was also much less steady than at baseline yesterday.  She normally walks with a cane.  She was too weak to walk without assistance yesterday.  HPI  Past Medical History:  Diagnosis Date  . Allergic rhinitis   . Anemia   . Blood transfusion   . Dementia (HCC)   . Diabetes mellitus    type  II- diet controlled  . GERD (gastroesophageal reflux disease)   . Glaucoma    bilateral  . Hard of hearing   . Hyperlipidemia   . Hypertension   . Hypothyroidism   . Macular degeneration   . Osteoarthritis    hands  . Osteoporosis   . PONV (postoperative nausea and vomiting)   . Rectal prolapse   . Urinary incontinence    stress  . Vision loss of right eye     Patient Active Problem List   Diagnosis Date Noted  . Gait disturbance 10/20/2013  . Malnutrition of moderate degree (HCC) 09/29/2013  . Rectal procidentia / prolapse 09/05/2011  . GERD 03/24/2007  . URINARY INCONTINENCE 03/24/2007  . Diabetes (HCC) 07/23/2006  . Hyperlipidemia 07/23/2006  . Dementia (HCC) 07/23/2006  . Essential hypertension 07/23/2006  . Allergic rhinitis, cause unspecified 07/23/2006  . OSTEOARTHRITIS 07/23/2006  . Hypothyroidism 07/15/2006  . ANEMIA 07/15/2006  . CATARACTS 07/15/2006     Past Surgical History:  Procedure Laterality Date  . ABDOMINAL HYSTERECTOMY     nonmalignant reasons  . CARPAL TUNNEL RELEASE     bilateral  . CATARACT EXTRACTION  2006   left  . LUMBAR FUSION  3-01   spinal cord leak- requiring 2 month admission  . right eye surgery  7-00  . VEIN LIGATION AND STRIPPING     left leg     OB History   None      Home Medications    Prior to Admission medications   Medication Sig Start Date End Date Taking? Authorizing Provider  acetaminophen (TYLENOL) 325 MG tablet Take 2 tablets (650 mg total) by mouth every 6 (six) hours as needed for mild pain (or Fever >/= 101). 10/01/13   Alison Murray, MD  amLODipine (NORVASC) 10 MG tablet Take 1 tablet (10 mg total) by mouth daily with breakfast. 11/25/17   Pincus Sanes, MD  aspirin 81 MG tablet Take 81 mg by mouth at bedtime.     [provider]  brimonidine (ALPHAGAN) 0.15 % ophthalmic solution Place 1 drop into the left eye 2 (two) times daily. 11/25/17   Pincus Sanes, MD  calcium carbonate (TUMS) 500 MG chewable tablet Chew 2 tablets by mouth 2 (two) times daily. Takes 1 tablet at lunch and 1 tablet at dinner  [provider]  fluticasone (FLONASE) 50 MCG/ACT nasal spray Place 2 sprays into both nostrils daily. 11/25/17   Pincus SanesBurns, Stacy J, MD  hydrochlorothiazide (HYDRODIURIL) 50 MG tablet Take 1 tablet (50 mg total) by mouth daily. 11/25/17   Pincus SanesBurns, Stacy J, MD  levothyroxine (SYNTHROID, LEVOTHROID) 100 MCG tablet Take 1 tablet (100 mcg total) by mouth daily before breakfast. 11/25/17   Burns, Bobette MoStacy J, MD  loratadine (CLARITIN) 10 MG tablet Take 10 mg by mouth daily as needed for allergies.    [provider]  losartan (COZAAR) 100 MG tablet Take 1 tablet (100 mg total) by mouth daily. 11/25/17   Pincus SanesBurns, Stacy J, MD  multivitamin [facility](THERAGRAN) per tablet Take 1 tablet by mouth daily.     [provider]  omeprazole (PRILOSEC) 40 MG capsule Take 1 capsule (40 mg total)  by mouth daily with lunch. 11/25/17   Pincus SanesBurns, Stacy J, MD  Polyethyl Glycol-Propyl Glycol (SYSTANE) 0.4-0.3 % SOLN Place 1 drop into both eyes 2 (two) times daily.    [provider]  potassium chloride (KLOR-CON M10) 10 MEQ tablet Take 1 tablet (10 mEq total) by mouth daily. 11/25/17   Pincus SanesBurns, Stacy J, MD  vitamin C (ASCORBIC ACID) 500 MG tablet Take 1,000 mg by mouth daily.    [provider]  vitamin E (VITAMIN E) 1000 UNIT capsule Take 1,000 Units by mouth daily.     [provider]    Family History Family History  Problem Relation Age of Onset  . Other Mother        CVA/ grief  . Heart disease Mother   . Heart attack Father   . Heart disease Father   . Heart attack Brother 47  . Cancer Neg Hx        breast or colon    Social History Social History   Tobacco Use  . Smoking status: Former Smoker    Last attempt to quit: 09/28/1973    Years since quitting: 44.3  . Smokeless tobacco: Never Used  Substance Use Topics  . Alcohol use: No  . Drug use: No     Allergies   Ceftriaxone; Lactose intolerance (gi); Rofecoxib; and Sulfur   Review of Systems Review of Systems  Unable to perform ROS: Dementia  Neurological: Positive for weakness.     Physical Exam Updated Vital Signs BP (!) 82/46 (BP Location: Right Arm)   Pulse 83   Resp 16   Ht 5\' 1"  (1.549 m)   Wt 46.7 kg   SpO2 100%   BMI 19.46 kg/m   Physical Exam  Constitutional:  Frail-appearing  HENT:  Head: Normocephalic and atraumatic.  Mucous membranes dry  Eyes: EOM are normal.  Neck: Neck supple. No tracheal deviation present. No thyromegaly present.  Cardiovascular: Normal rate and regular rhythm.  No murmur heard. Pulmonary/Chest: Effort normal and breath sounds normal.  Abdominal: Soft. Bowel sounds are normal. She exhibits no distension. There is no tenderness.  Genitourinary:  Genitourinary Comments: Rectum normal tone brown stool.  No gross blood  Musculoskeletal:  Normal range of motion. She exhibits no edema or tenderness.  biLateral upper extremities tender at shoulders.  No deformity.  Pelvis stable.  Entire spine is nontender.  All 4 extremities without contusion abrasion or tenderness neurovascular intact  Neurological: She is alert. Coordination normal.  Skin: Skin is warm and dry. No rash noted.  Nursing note and vitals reviewed.    ED Treatments / Results  Labs (all labs  ordered are listed, but only abnormal results are displayed) Labs Reviewed  URINALYSIS, ROUTINE W REFLEX MICROSCOPIC  CK  COMPREHENSIVE METABOLIC PANEL  CBC WITH DIFFERENTIAL/PLATELET  I-STAT TROPONIN, ED  I-STAT TROPONIN, ED  POC OCCULT BLOOD, ED    EKG EKG Interpretation  Date/Time:  Friday February 07 2018 13:40:03 EST Ventricular Rate:  79 PR Interval:    QRS Duration: 103 QT Interval:  434 QTC Calculation: 498 R Axis:   64 Text Interpretation:  Sinus rhythm Abnormal T, probable ischemia, widespread Borderline ST elevation, lateral leads New since previous tracing Confirmed by Doug Sou [phone]) on 01/24/2018 1:46:26 PM   Radiology No results found.  Procedures Procedures (including critical care time)  Medications Ordered in ED Medications  sodium chloride 0.9 % bolus 1,000 mL (has no administration in time range)  thiamine (B-1) injection 100 mg (has no administration in time range)     Initial Impression / Assessment and Plan / ED Course  I have reviewed the triage vital signs and the nursing notes.  Pertinent labs & imaging results that were available during my care of the patient were reviewed by me and considered in my medical decision making (see chart for details).   Lab work consistent with acute myocardial infarction, rhabdomyolysis, urinary tract infection, anemia and renal insufficiency. I did have lengthy discussion with patient's daughter who is her healthcare power of attorney and states that patient is DNR CODE STATUS.   Palliative care consult called. X-rays viewed by me.  4:30 PM patient continued to complained of shoulder pain.  Additional IV fentanyl ordered.  Aspirin per rectum ordered as patient's lab work consistent with myocardial infarction.  I consulted Dr. Jarvis Newcomer from [facility] hospitalist service who will arrange for admission.  I also consulted cardiology service who will evaluate patient while in hospital.  I also consulted palliative care service discussed goals of care with family.  I spoke with pharmacist who suggested ampicillin 500 mg IV every 12 hours to treat urinary tract infection Final Clinical Impressions(s) / ED Diagnoses  Diagnoses #1NSTEMI #2 rhabdomyolysis #3 urinary tract infection #4 renal insufficiency Final diagnoses:  None  #4 urinary tract infection # 5 fall CRITICAL CARE Performed by: Doug Sou Total critical care time: 60 minutes Critical care time was exclusive of separately billable procedures and treating other patients. Critical care was necessary to treat or prevent imminent or life-threatening deterioration. Critical care was time spent personally by me on the following activities: development of treatment plan with patient and/or surrogate as well as nursing, discussions with consultants, evaluation of patient's response to treatment, examination of patient, obtaining history from patient or surrogate, ordering and performing treatments and interventions, ordering and review of laboratory studies, ordering and review of radiographic studies, pulse oximetry and re-evaluation of patient's condition. ED Discharge Orders    None       Doug Sou, MD 01/28/2018 1651

## 2018-02-07 NOTE — ED Notes (Signed)
MD notified of elevated troponin.

## 2018-02-07 NOTE — Assessment & Plan Note (Signed)
Unsure if this is from her fall or a true MI- cycle Troponin, check echo. She is not a candidate for invasive work up.

## 2018-02-08 ENCOUNTER — Inpatient Hospital Stay (HOSPITAL_COMMUNITY): Payer: Medicare HMO

## 2018-02-08 DIAGNOSIS — I361 Nonrheumatic tricuspid (valve) insufficiency: Secondary | ICD-10-CM

## 2018-02-08 DIAGNOSIS — Z515 Encounter for palliative care: Secondary | ICD-10-CM

## 2018-02-08 DIAGNOSIS — I214 Non-ST elevation (NSTEMI) myocardial infarction: Principal | ICD-10-CM

## 2018-02-08 DIAGNOSIS — L8990 Pressure ulcer of unspecified site, unspecified stage: Secondary | ICD-10-CM

## 2018-02-08 LAB — BASIC METABOLIC PANEL
Anion gap: 9 (ref 5–15)
BUN: 37 mg/dL — AB (ref 8–23)
CO2: 19 mmol/L — ABNORMAL LOW (ref 22–32)
Calcium: 7.8 mg/dL — ABNORMAL LOW (ref 8.9–10.3)
Chloride: 110 mmol/L (ref 98–111)
Creatinine, Ser: 1.61 mg/dL — ABNORMAL HIGH (ref 0.44–1.00)
GFR calc Af Amer: 31 mL/min — ABNORMAL LOW (ref >60)
GFR calc non Af Amer: 27 mL/min — ABNORMAL LOW (ref >60)
Glucose, Bld: 95 mg/dL (ref 70–99)
Potassium: 3.9 mmol/L (ref 3.5–5.1)
Sodium: 138 mmol/L (ref 135–145)

## 2018-02-08 LAB — ECHOCARDIOGRAM COMPLETE
Height: 61 in
Weight: 1636.7 oz

## 2018-02-08 LAB — CBC
HCT: 28.1 % — ABNORMAL LOW (ref 36.0–46.0)
Hemoglobin: 8.6 g/dL — ABNORMAL LOW (ref 12.0–15.0)
MCH: 28.4 pg (ref 26.0–34.0)
MCHC: 30.6 g/dL (ref 30.0–36.0)
MCV: 92.7 fL (ref 80.0–100.0)
Platelets: 148 10*3/uL — ABNORMAL LOW (ref 150–400)
RBC: 3.03 MIL/uL — ABNORMAL LOW (ref 3.87–5.11)
RDW: 14.1 % (ref 11.5–15.5)
WBC: 7.4 10*3/uL (ref 4.0–10.5)
nRBC: 0 % (ref 0.0–0.2)

## 2018-02-08 LAB — TROPONIN I: Troponin I: 5.77 ng/mL (ref <0.03)

## 2018-02-08 LAB — CK: Total CK: 916 U/L — ABNORMAL HIGH (ref 38–234)

## 2018-02-08 MED ORDER — SODIUM CHLORIDE 0.9 % IV SOLN: INTRAVENOUS | Status: DC

## 2018-02-08 MED ORDER — CEFAZOLIN SODIUM-DEXTROSE 1-4 GM/50ML-% IV SOLN
1.0000 g | Freq: Two times a day (BID) | INTRAVENOUS | Status: DC
  Administered 2018-02-08 – 2018-02-09 (×2): 1 g via INTRAVENOUS
  Filled 2018-02-08 (×2): qty 50

## 2018-02-08 MED ORDER — SODIUM CHLORIDE 0.9 % IV SOLN: 500.0000 mg | Freq: Four times a day (QID) | INTRAVENOUS | Status: DC

## 2018-02-08 MED ORDER — SODIUM CHLORIDE 0.9 % IV BOLUS
1000.0000 mL | Freq: Once | INTRAVENOUS | Status: AC
  Administered 2018-02-08: 1000 mL via INTRAVENOUS

## 2018-02-08 MED ORDER — ALBUMIN HUMAN 25 % IV SOLN
50.0000 g | Freq: Four times a day (QID) | INTRAVENOUS | Status: DC
  Administered 2018-02-08: 50 g via INTRAVENOUS
  Administered 2018-02-08: 37.5 g via INTRAVENOUS
  Filled 2018-02-08: qty 200
  Filled 2018-02-08: qty 100
  Filled 2018-02-08: qty 50
  Filled 2018-02-08: qty 150
  Filled 2018-02-08: qty 100

## 2018-02-08 NOTE — Plan of Care (Signed)
  Problem: Clinical Measurements: Goal: Diagnostic test results will improve Outcome: Progressing Goal: Respiratory complications will improve Outcome: Progressing Goal: Cardiovascular complication will be avoided Outcome: Progressing   

## 2018-02-08 NOTE — Progress Notes (Signed)
   Progress Note  Patient Name: Desiree [name] Date of Encounter: 02/08/2018  Primary Cardiologist: Dr. Everette RankJay Varanasi  I reviewed the chart including cardiology consultation and recommendations from 11/29 per Dr. Eldridge DaceVaranasi.  She has enzymatic evidence of NSTEMI with peak troponin I 11.07, down to 5.77.  This is in the setting of dementia, recent fall, acute renal failure with relative dehydration, anemia, and pyuria.  Patient is DNR and I note plans for comfort measures and palliative care consultation per hospitalist note.  No further invasive cardiac testing is planned.  She is scheduled for an echocardiogram, pending at present.  Consider aspirin but probably not Plavix at this time in light of downward drift in hemoglobin and thrombocytopenia.  Recent blood pressure low normal to mildly reduced, currently not on antihypertensives (Norvasc and Cozaar as outpatient).  Basic conservative cardiac therapy would generally be antiplatelet medication and statin as well as potentially a low-dose beta-blocker or resumption of Norvasc depending on her heart rate and blood pressure.  We will be available for questions but will plan to sign off for now.  Signed, Nona DellSamuel McDowell, MD  02/08/2018, 9:21 AM

## 2018-02-08 NOTE — Progress Notes (Signed)
PROGRESS NOTE  Desiree [name]:[id number] DOB: Mar 13, 1920 DOA: 01/16/2018 PCP: Pincus Sanes, MD  HPI/Recap of past 24 hours: Desiree [name] is a 84 y.o. female with a history of HTN, diet-controlled T2DM, hyperlipidemia, hypothyroidism, GERD, moderate malnutrition and dementia who lives at home and is brought in by family for weakness and pain following an unwitnessed fall at home. She was found down yesterday morning by her granddaughter, was oriented, and able to walk with slightly more assistance than usual, using a walker when she usually needs only a cane. She complained of some shoulder pain and had some bruising, but went to thanksgiving dinner that night, though she didn't eat very much because she was unable to lift her shoulders due to pain. Her weakness continued to the point that she was unable to assist getting up much at all this morning, but was able to eat some breakfast and take her BP and other medications. The pain in the shoulders was constant, severe, worse with any movement, radiating down the back, and only very modestly improved with fentanyl in the ED.   History is obtained from daughter and granddaughter predominantly due to patient's pain/distress and dementia.    02/08/2018: Patient seen and examined with her daughter at bedside.  She is alert, pleasantly demented and appears comfortable in the bed.  Patient is DNR and family is leaning towards comfort care.  She denies chest pain, palpitation or dyspnea in the bed.  Assessment/Plan: Principal Problem:   Fall Active Problems:   Hypothyroidism   Diabetes (HCC)   Dementia (HCC)   Essential hypertension   GERD (gastroesophageal reflux disease)   Osteoarthritis   Malnutrition of moderate degree (HCC)   Elevated troponin level   HOH (hard of hearing)   Rhabdomyolysis   Intractable pain   Acute renal failure (HCC)   Palliative care encounter   Pressure injury of skin  Fall at home with rhabdomyolysis and  muscular pain Elevated CPK Pain in shoulders bilaterally Continue pain management for comfort Family is leaning towards comfort care Palliative care team has been consulted Social worker consulted to assist family with placement if needed  NSTEMI Troponin peaked at 10.46 and trended down Cardiology consulted and signed off as patient is now leaning towards comfort care Currently on telemetry  Bilateral shoulder pain post fall Continue pain management  Chronic normocytic anemia Hemoglobin 8.6 with baseline hemoglobin of 10 No sign of overt bleeding  AKI on CKD 2 Suspect prerenal from dehydration Baseline creatinine appears to be 1.02 Creatinine on presentation 1.8 which is trending down to 1.6 Give gentle IV fluid hydration  UTI Unable to assess symptomatology due to dementia Positive UA Urine culture grew greater than 100,000 colonies of gram-negative rods Start Rocephin Okay to treat per daughter Monitor urine output  Hypothyroidism Continue levothyroxine  GERD Continue Protonix  DVT prophylaxis:  Limited comfort measures  Code Status: DNR  Family Communication: Daughter at bedside all questions answered to her satisfaction. Disposition Plan: Uncertain at this time Consults called:  Palliative care team     Objective: Vitals:   01/13/2018 2350 02/08/18 0300 02/08/18 0846 02/08/18 1230  BP: (!) 92/57 (!) 90/55 (!) 103/47 (!) 92/48  Pulse:  65 83 84  Resp:      Temp:   98 F (36.7 C) 98.3 F (36.8 C)  TempSrc:   Oral Oral  SpO2: 96% 95% 95% 95%  Weight:  46.4 kg    Height:  Intake/Output Summary (Last 24 hours) at 02/08/2018 1505 Last data filed at 02/08/2018 0232 Gross per 24 hour  Intake 1397.39 ml  Output -  Net 1397.39 ml   Filed Weights   Feb 26, 2018 1319 26-Feb-2018 1846 02/08/18 0300  Weight: 46.7 kg 46.3 kg 46.4 kg    Exam:  . General: 84 y.o. year-old female frail in no acute distress.  Pleasantly demented.  Alert and interactive in  the setting of dementia. . Cardiovascular: Regular rate and rhythm with no rubs or gallops.  No JVD or thyromegaly noted.   Marland Kitchen Respiratory: Clear to auscultation with no wheezes or rales.  Good inspiratory effort. . Abdomen: Soft nontender nondistended with normal bowel sounds x4 quadrants. . Musculoskeletal: No lower extremity edema. 2/4 pulses in all 4 extremities. . Skin: No ulcerative lesions noted or rashes, . Psychiatry: Mood is appropriate for condition and setting   Data Reviewed: CBC: Recent Labs  Lab 02/26/2018 1332 02/08/18 0333  WBC 11.9* 7.4  NEUTROABS 9.9*  --   HGB 9.9* 8.6*  HCT 31.8* 28.1*  MCV 91.9 92.7  PLT 166 148*   Basic Metabolic Panel: Recent Labs  Lab 02-26-18 1332 02/08/18 0333  NA 138 138  K 3.8 3.9  CL 103 110  CO2 22 19*  GLUCOSE 122* 95  BUN 42* 37*  CREATININE 1.82* 1.61*  CALCIUM 8.6* 7.8*   GFR: Estimated Creatinine Clearance: 15.3 mL/min (A) (by C-G formula based on SCr of 1.61 mg/dL (H)). Liver Function Tests: Recent Labs  Lab 2018-02-26 1332  AST 165*  ALT 55*  ALKPHOS 79  BILITOT 0.7  PROT 6.4*  ALBUMIN 3.4*   No results for input(s): LIPASE, AMYLASE in the last 168 hours. No results for input(s): AMMONIA in the last 168 hours. Coagulation Profile: No results for input(s): INR, PROTIME in the last 168 hours. Cardiac Enzymes: Recent Labs  Lab 2018/02/26 1332 February 26, 2018 1532 02/08/18 0333  CKTOTAL 1,945*  --  916*  TROPONINI  --  10.46* 5.77*   BNP (last 3 results) No results for input(s): PROBNP in the last 8760 hours. HbA1C: No results for input(s): HGBA1C in the last 72 hours. CBG: No results for input(s): GLUCAP in the last 168 hours. Lipid Profile: No results for input(s): CHOL, HDL, LDLCALC, TRIG, CHOLHDL, LDLDIRECT in the last 72 hours. Thyroid Function Tests: No results for input(s): TSH, T4TOTAL, FREET4, T3FREE, THYROIDAB in the last 72 hours. Anemia Panel: No results for input(s): VITAMINB12, FOLATE,  FERRITIN, TIBC, IRON, RETICCTPCT in the last 72 hours. Urine analysis:    Component Value Date/Time   COLORURINE AMBER (A) 02/26/18 1430   APPEARANCEUR CLOUDY (A) 02/26/2018 1430   LABSPEC 1.013 Feb 26, 2018 1430   PHURINE 5.0 02/26/2018 1430   GLUCOSEU NEGATIVE 02-26-2018 1430   HGBUR SMALL (A) 2018-02-26 1430   BILIRUBINUR NEGATIVE February 26, 2018 1430   BILIRUBINUR Neg 04/19/2014 1443   KETONESUR NEGATIVE 2018/02/26 1430   PROTEINUR 30 (A) 02-26-18 1430   UROBILINOGEN 0.2 04/19/2014 1443   UROBILINOGEN 0.2 09/28/2013 1153   NITRITE NEGATIVE 26-Feb-2018 1430   LEUKOCYTESUR LARGE (A) 26-Feb-2018 1430   Sepsis Labs: @LABRCNTIP (procalcitonin:4,lacticidven:4)  ) Recent Results (from the past 240 hour(s))  Urine Culture     Status: Abnormal (Preliminary result)   Collection Time: 02-26-2018  3:46 PM  Result Value Ref Range Status   Specimen Description URINE, CATHETERIZED  Final   Special Requests   Final    Immunocompromised Performed at [facility], [address] [place], [place]  [address]    Culture >=100,000 COLONIES/mL GRAM NEGATIVE RODS (A)  Final   Report Status PENDING  Incomplete      Studies: Ct Abdomen Pelvis Wo Contrast  Result Date: 2018/02/24 CLINICAL DATA:  Hypotension.  Found on floor.  Diarrhea. EXAM: CT ABDOMEN AND PELVIS WITHOUT CONTRAST TECHNIQUE: Multidetector CT imaging of the abdomen and pelvis was performed following the standard protocol without IV contrast. COMPARISON:  None. FINDINGS: Lower chest: No acute abnormality. Normal heart size. Aortic atherosclerosis and 3 vessel coronary artery atherosclerotic calcifications. Hepatobiliary: No focal liver abnormality is seen. No gallstones, gallbladder wall thickening, or biliary dilatation. Pancreas: Unremarkable. No pancreatic ductal dilatation or surrounding inflammatory changes. Spleen: Normal in size without focal abnormality. Adrenals/Urinary Tract: Adrenal glands are unremarkable. Kidneys are  normal, without renal calculi, focal lesion, or hydronephrosis. Bladder is unremarkable. Stomach/Bowel: Stomach is within normal limits. Extensive colonic diverticulosis identified. No acute inflammation identified. Postoperative changes within the sigmoid colon identified. The appendix is visualized and appears normal. No evidence of bowel wall thickening, distention, or inflammatory changes. Vascular/Lymphatic: Aortic atherosclerosis. No aneurysm. No adenopathy within the abdomen or pelvis. Reproductive: Status post hysterectomy. No adnexal masses. Other: No free fluid or fluid collections. No abdominal wall hernia identified. Musculoskeletal: Spondylosis within the lower thoracic and lumbar spine. Status post posterior decompression and interbody fusion at L2-3, L3-4 and L4-5. Degenerative disc disease identified at the L1-2 and L5-S1. Chronic appearing right pubic rami fractures noted. IMPRESSION: 1. No acute findings within the abdomen or pelvis. 2.  Aortic Atherosclerosis (ICD10-I70.0). 3. Multi vessel coronary artery atherosclerotic calcifications. Electronically Signed   By: Signa Kell M.D.   On: 2018/02/24 16:09   Ct Head Wo Contrast  Result Date: 2018-02-24 CLINICAL DATA:  Unwitnessed fall, altered level of consciousness. EXAM: CT HEAD WITHOUT CONTRAST CT CERVICAL SPINE WITHOUT CONTRAST TECHNIQUE: Multidetector CT imaging of the head and cervical spine was performed following the standard protocol without intravenous contrast. Multiplanar CT image reconstructions of the cervical spine were also generated. COMPARISON:  None. FINDINGS: CT HEAD FINDINGS Brain: Mild chronic ischemic white matter disease is noted. No mass effect or midline shift is noted. Ventricular size is within normal limits. There is no evidence of mass lesion, hemorrhage or acute infarction. Vascular: No hyperdense vessel or unexpected calcification. Skull: Normal. Negative for fracture or focal lesion. Sinuses/Orbits: No acute  finding. Other: None. CT CERVICAL SPINE FINDINGS Alignment: Normal. Skull base and vertebrae: No acute fracture. No primary bone lesion or focal pathologic process. Soft tissues and spinal canal: No prevertebral fluid or swelling. No visible canal hematoma. Disc levels: Large bridging anterior osteophyte formation is seen throughout the cervical spine. Disc spaces are otherwise normal. Upper chest: Negative. Other: None. IMPRESSION: Mild chronic ischemic white matter disease. No acute intracranial abnormality seen. Extensive degenerative changes are noted in the cervical spine. No fracture or spondylolisthesis is noted. Electronically Signed   By: Lupita Raider, M.D.   On: 02-24-18 16:10   Ct Cervical Spine Wo Contrast  Result Date: Feb 24, 2018 CLINICAL DATA:  Unwitnessed fall, altered level of consciousness. EXAM: CT HEAD WITHOUT CONTRAST CT CERVICAL SPINE WITHOUT CONTRAST TECHNIQUE: Multidetector CT imaging of the head and cervical spine was performed following the standard protocol without intravenous contrast. Multiplanar CT image reconstructions of the cervical spine were also generated. COMPARISON:  None. FINDINGS: CT HEAD FINDINGS Brain: Mild chronic ischemic white matter disease is noted. No mass effect or midline shift is noted. Ventricular size is within normal limits. There is no  evidence of mass lesion, hemorrhage or acute infarction. Vascular: No hyperdense vessel or unexpected calcification. Skull: Normal. Negative for fracture or focal lesion. Sinuses/Orbits: No acute finding. Other: None. CT CERVICAL SPINE FINDINGS Alignment: Normal. Skull base and vertebrae: No acute fracture. No primary bone lesion or focal pathologic process. Soft tissues and spinal canal: No prevertebral fluid or swelling. No visible canal hematoma. Disc levels: Large bridging anterior osteophyte formation is seen throughout the cervical spine. Disc spaces are otherwise normal. Upper chest: Negative. Other: None.  IMPRESSION: Mild chronic ischemic white matter disease. No acute intracranial abnormality seen. Extensive degenerative changes are noted in the cervical spine. No fracture or spondylolisthesis is noted. Electronically Signed   By: Lupita RaiderJames  Green Jr, M.D.   On: 2017/05/23 16:10    Scheduled Meds: . brimonidine  1 drop Left Eye BID  . levothyroxine  100 mcg Oral QAC breakfast  . pantoprazole  40 mg Oral Daily  . polyvinyl alcohol  1 drop Both Eyes BID  . sodium chloride flush  3 mL Intravenous Q12H    Continuous Infusions: . sodium chloride 75 mL/hr at 02/08/18 1005     LOS: 1 day     Darlin Droparole N Hall, MD [facility] Pager [phone]  If 7PM-7AM, please contact night-coverage [email or web address] Password [email or web address] 02/08/2018, 3:05 PM

## 2018-02-08 NOTE — Progress Notes (Signed)
  Echocardiogram 2D Echocardiogram has been performed.  Desiree [name] 02/08/2018, 1:59 PM

## 2018-02-08 NOTE — Consult Note (Signed)
Consultation Note Date: 02/08/2018   Patient Name: Desiree [name]  DOB: [birth year]  MRN: [id number]  Age / Sex: 84 y.o., female  PCP: Binnie Rail, MD Referring Physician: Kayleen Memos, DO  Reason for Consultation: Establishing goals of care  HPI/Patient Profile: 84 y.o. female  with past medical history of dementia, diabetes, GERD, hypertension, hypothyroidism, OA, who was admitted on 01/13/2018 with rhabdomyolysis, acute renal failure, non-STEMI, and generalized pain following a fall at home.  Family opted to treat her conservatively with IV fluids with plan to primarily focus on her comfort.  Palliative care was consulted to help address goals.  Clinical Assessment and Goals of Care: I met with patient's daughter.  Patient appears somewhat clinically improved today as she is more alert and joking with daughter.  Patient is not eating and drinking much.  Prior to this hospitalization patient was living at home alone.  Daughter says that she was actually exploring the option of transferring patient to an assisted living facility given progressive cognitive decline.  She says she has applied for long-term Medicaid.  Daughter recognizes that patient may not return to her previous baseline.  She verbalized that she is primarily interested in patient's comfort and confirms plan to forego aggressive medical care.  Patient has told her several times that she is ready to die when it is her time.    Daughter is concerned about options for discharge from the hospital.  She knows that patient cannot return home to live independently.  We discussed several possibilities including moving into daughter's home with hired sitters, assisted living, rehab, or residential hospice, all dependent upon how patient does in the coming days.  Daughter would be interested in hospice involvement if possible at time of discharge.  Will  consult social work.  Daughter confirms DNR.  She says patient has a living will.  Daughter is reportedly her healthcare power of attorney.  SUMMARY OF RECOMMENDATIONS   1.  Continue supportive care 2.  DNR 3.  Disposition unclear but patient likely hospice appropriate  4.  Consult social work     Primary Diagnoses: Present on Admission: . Elevated troponin level . Dementia ([place]) . Essential hypertension . Fall . Hypothyroidism . Malnutrition of moderate degree ([place]) . GERD (gastroesophageal reflux disease) . Rhabdomyolysis . Intractable pain . Osteoarthritis . Acute renal failure ([place])   I have reviewed the medical record, interviewed the patient and family, and examined the patient. The following aspects are pertinent.  Past Medical History:  Diagnosis Date  . Allergic rhinitis   . Anemia   . Blood transfusion   . Dementia ([place])   . Diabetes mellitus    type  II- diet controlled  . GERD (gastroesophageal reflux disease)   . Glaucoma    bilateral  . Hard of hearing   . Hyperlipidemia   . Hypertension   . Hypothyroidism   . Macular degeneration   . Osteoarthritis    hands  . Osteoporosis   . PONV (postoperative nausea and vomiting)   .  Rectal prolapse   . Urinary incontinence    stress  . Vision loss of right eye    Social History   Socioeconomic History  . Marital status: Widowed    Spouse name: Not on file  . Number of children: Not on file  . Years of education: Not on file  . Highest education level: Not on file  Occupational History  . Occupation: homemaker  Social Needs  . Financial resource strain: Not on file  . Food insecurity:    Worry: Not on file    Inability: Not on file  . Transportation needs:    Medical: Not on file    Non-medical: Not on file  Tobacco Use  . Smoking status: Former Smoker    Last attempt to quit: 09/28/1973    Years since quitting: 44.3  . Smokeless tobacco: Never Used  Substance and Sexual Activity  .  Alcohol use: No  . Drug use: No  . Sexual activity: Never  Lifestyle  . Physical activity:    Days per week: Not on file    Minutes per session: Not on file  . Stress: Not on file  Relationships  . Social connections:    Talks on phone: Not on file    Gets together: Not on file    Attends religious service: Not on file    Active member of club or organization: Not on file    Attends meetings of clubs or organizations: Not on file    Relationship status: Not on file  Other Topics Concern  . Not on file  Social History Narrative   married 30 years -divorced; remarried 35 -widowed '83 . 1 daughter 1 son. 1 grandchild, 2 great grandchildren. worked in Microbiologist, retired. lives alone - IADLs except driving.    Terminal care issues: patient clearly states, daughter present, that she would not want heroic or futile care, e.g. CPR, mechanical vent. support, etc. DNR/DNI, no HEROICs   Family History  Problem Relation Age of Onset  . Other Mother        CVA/ grief  . Heart disease Mother   . Heart attack Father   . Heart disease Father   . Heart attack Brother 78  . Cancer Neg Hx        breast or colon   Scheduled Meds: . brimonidine  1 drop Left Eye BID  . levothyroxine  100 mcg Oral QAC breakfast  . pantoprazole  40 mg Oral Daily  . polyvinyl alcohol  1 drop Both Eyes BID  . sodium chloride flush  3 mL Intravenous Q12H   Continuous Infusions: . sodium chloride 75 mL/hr at 02/08/18 1005   PRN Meds:.acetaminophen **OR** acetaminophen, bisacodyl, calcium carbonate, fluticasone, HYDROmorphone (DILAUDID) injection, loratadine, senna-docusate Medications Prior to Admission:  Prior to Admission medications   Medication Sig Start Date End Date Taking? Authorizing Provider  amLODipine (NORVASC) 10 MG tablet Take 1 tablet (10 mg total) by mouth daily with breakfast. 11/25/17  Yes Burns, Claudina Lick, MD  aspirin 81 MG tablet Take 81 mg by mouth at bedtime.    Yes [provider]  brimonidine (ALPHAGAN) 0.15 % ophthalmic solution Place 1 drop into the left eye 2 (two) times daily. 11/25/17  Yes Burns, Claudina Lick, MD  calcium carbonate (TUMS) 500 MG chewable tablet Chew 1 tablet by mouth 2 (two) times daily as needed for indigestion or heartburn.    Yes [provider]  fluticasone (FLONASE) 50 MCG/ACT nasal  spray Place 2 sprays into both nostrils daily. 11/25/17  Yes Burns, Claudina Lick, MD  hydrochlorothiazide (HYDRODIURIL) 50 MG tablet Take 1 tablet (50 mg total) by mouth daily. 11/25/17  Yes Burns, Claudina Lick, MD  levothyroxine (SYNTHROID, LEVOTHROID) 100 MCG tablet Take 1 tablet (100 mcg total) by mouth daily before breakfast. 11/25/17  Yes Burns, Claudina Lick, MD  loratadine (CLARITIN) 10 MG tablet Take 10 mg by mouth daily as needed for allergies.   Yes [provider]  losartan (COZAAR) 100 MG tablet Take 1 tablet (100 mg total) by mouth daily. 11/25/17  Yes Burns, Claudina Lick, MD  multivitamin [facility]) per tablet Take 1 tablet by mouth daily.    Yes [provider]  omeprazole (PRILOSEC) 40 MG capsule Take 1 capsule (40 mg total) by mouth daily with lunch. Patient taking differently: Take 40 mg by mouth daily.  11/25/17  Yes Burns, Claudina Lick, MD  Polyethyl Glycol-Propyl Glycol (SYSTANE) 0.4-0.3 % SOLN Place 1 drop into both eyes 2 (two) times daily.   Yes [provider]  potassium chloride (KLOR-CON M10) 10 MEQ tablet Take 1 tablet (10 mEq total) by mouth daily. 11/25/17  Yes Burns, Claudina Lick, MD  vitamin C (ASCORBIC ACID) 500 MG tablet Take 1,000 mg by mouth daily.   Yes [provider]  vitamin E (VITAMIN E) 1000 UNIT capsule Take 1,000 Units by mouth daily.    Yes [provider]   Allergies  Allergen Reactions  . Ceftriaxone Other (See Comments)    Bioxon makes her skin crawl  . Lactose Intolerance (Gi) Other (See Comments)    Gi upset  . Rofecoxib Other (See Comments)    Vioxx caused gi issues  . Sulfur Hives    Review of Systems  Physical Exam  Constitutional:  Thin, frail appearing  Pulmonary/Chest: Effort normal.  Neurological: She is alert.  confused  Skin: Skin is warm and dry.    Vital Signs: BP (!) 92/48 (BP Location: Right Leg)   Pulse 84   Temp 98.3 F (36.8 C) (Oral)   Resp 13   Ht 5' 1"  (1.549 m)   Wt 46.4 kg   SpO2 95%   BMI 19.33 kg/m  Pain Scale: 0-10 POSS *See Group Information*: 1-Acceptable,Awake and alert Pain Score: 0-No pain   SpO2: SpO2: 95 % O2 Device:SpO2: 95 % O2 Flow Rate: .   IO: Intake/output summary:   Intake/Output Summary (Last 24 hours) at 02/08/2018 1355 Last data filed at 02/08/2018 4010 Gross per 24 hour  Intake 2397.39 ml  Output -  Net 2397.39 ml    LBM: Last BM Date: 01/17/2018 Baseline Weight: Weight: 46.7 kg Most recent weight: Weight: 46.4 kg     Palliative Assessment/Data:     Time In: 0930 Time Out: 1020 Time Total: 50 minutes Greater than 50%  of this time was spent counseling and coordinating care related to the above assessment and plan.  Signed by: Irean Hong, NP   Please contact Palliative Medicine Team phone at [phone] for questions and concerns.  For individual provider: See Shea Evans

## 2018-02-09 LAB — URINE CULTURE: Culture: >=100000 — AB

## 2018-02-09 MED ORDER — ALBUMIN HUMAN 25 % IV SOLN: 50.0000 g | INTRAVENOUS | Status: DC

## 2018-02-09 MED ORDER — MORPHINE 100MG IN NS 100ML (1MG/ML) PREMIX INFUSION
1.0000 mg/h | INTRAVENOUS | Status: DC
  Administered 2018-02-09: 1 mg/h via INTRAVENOUS
  Filled 2018-02-09: qty 100

## 2018-02-09 NOTE — Clinical Social Work Note (Signed)
Clinical Social Work Assessment  Patient Details  Name: Desiree [name] MRN: [id number] Date of Birth: [birth year]  Date of referral:  02/09/18               Reason for consult:  Facility Placement, End of Life/Hospice                Permission sought to share information with:  Facility Sport and exercise psychologist, Family Supports Permission granted to share information::  No(patient not oriented)  Name::     Driscilla Grammes  Agency::  hospice  Relationship::  daughter  Contact Information:  [phone]  Housing/Transportation Living arrangements for the past 2 months:  Single Family Home Source of Information:  Adult Children Patient Interpreter Needed:  None Criminal Activity/Legal Involvement Pertinent to Current Situation/Hospitalization:  No - Comment as needed Significant Relationships:  Adult Children, Other Family Members Lives with:  Self Do you feel safe going back to the place where you live?  Yes Need for family participation in patient care:  Yes (Comment)  Care giving concerns: Patient from home. Admitted after a fall. Patient nearing end of life and is full comfort care. CSW consulted for residential hospice placement.   Social Worker assessment / plan: CSW met with patient's daughter, Quita Skye. Patient not alert, resting in bed. CSW introduced self and discussed disposition plan/plan for end of life care. CSW discussed options for residential hospice placement.   Daughter stated that MD indicated patient may pass away in the hospital. Daughter is open to hospice placement [facility]) if it is safe for patient to move, and is also at peace with patient passing away in the hospital. Daughter states she just wants what is best for patient.   CSW consulted with MD - planned to keep patient admitted tonight, and will consider residential hospice tomorrow pending how patient does.   CSW to follow and will refer to [facility] as appropriate.  Employment status:   Retired Astronomer) PT Recommendations:  Not assessed at this time Information / Referral to community resources:  Other (Comment Required)(residential hospice)  Patient/Family's Response to care: Family appreciative of care.  Patient/Family's Understanding of and Emotional Response to Diagnosis, Current Treatment, and Prognosis: Daughter with good understanding of patient's condition and prognosis. Daughter understands patient is nearing end of life.   Emotional Assessment Appearance:  Appears stated age Attitude/Demeanor/Rapport:  Unable to Assess Affect (typically observed):  Unable to Assess Orientation:  Oriented to Self Alcohol / Substance use:  Not Applicable Psych involvement (Current and /or in the community):  No (Comment)  Discharge Needs  Concerns to be addressed:  Grief and Loss Concerns, Discharge Planning Concerns, Other (Comment Required(end of life care) Readmission within the last 30 days:  No Current discharge risk:  Terminally ill, Physical Impairment Barriers to Discharge:  Continued Medical Work up   Estanislado Emms, LCSW 02/09/2018, 11:07 AM

## 2018-02-09 NOTE — Progress Notes (Signed)
PROGRESS NOTE  Desiree [name] UJW:[id number] DOB: 06/12/1920 DOA: May 29, 2017 PCP: Pincus SanesBurns, Stacy J, MD  HPI/Recap of past 24 hours: Desiree CooperAretta H [name] is a 84 y.o. female with a history of HTN, diet-controlled T2DM, hyperlipidemia, hypothyroidism, GERD, moderate malnutrition and dementia who lives at home and is brought in by family for weakness and pain following an unwitnessed fall at home. She was found down yesterday morning by her granddaughter, was oriented, and able to walk with slightly more assistance than usual, using a walker when she usually needs only a cane. She complained of some shoulder pain and had some bruising, but went to thanksgiving dinner that night, though she didn't eat very much because she was unable to lift her shoulders due to pain. Her weakness continued to the point that she was unable to assist getting up much at all this morning, but was able to eat some breakfast and take her BP and other medications. The pain in the shoulders was constant, severe, worse with any movement, radiating down the back, and only very modestly improved with fentanyl in the ED.   History is obtained from daughter and granddaughter predominantly due to patient's pain/distress and dementia.    02/08/2018: Patient seen and examined with her daughter at bedside.  She is alert, pleasantly demented and appears comfortable in the bed.  Patient is DNR and family is leaning towards comfort care.  She denies chest pain, palpitation or dyspnea in the bed.  02/09/2018: Patient seen and examined with her daughter at bedside.  Discussed with daughter who made a decision for comfort measures only.  Patient has no new complaints and denies any pain.  Assessment/Plan: Principal Problem:   Fall Active Problems:   Hypothyroidism   Diabetes (H[facility])   Dementia ([facility])   Essential hypertension   GERD (gastroesophageal reflux disease)   Osteoarthritis   Malnutrition of moderate degree ([facility])   Elevated troponin  level   HOH (hard of hearing)   Rhabdomyolysis   Intractable pain   Acute renal failure ([facility])   Palliative care encounter   Pressure injury of skin  Assessment and plan Fall at home with rhabdomyolysis and muscular pain NSTEMI Bilateral shoulder pain post fall Chronic normocytic anemia AKI on CKD 2 UTI Hypothyroidism GERD  Daughter who is medical POA made decision for comfort measures only.  All forms of treatment to prolong life have been withheld.  Possible discharge to hospice care tomorrow 02/14/2018 if vital signs become stable. Currently patient is too unstable for transport.      Objective: Vitals:   02/08/18 1959 02/09/18 0004 02/09/18 0510 02/09/18 1539  BP: (!) 75/30 (!) 75/42 (!) 84/24 105/60  Pulse: 81 86 87 82  Resp: 13  16   Temp: 98.6 F (37 C) 98.6 F (37 C) 98.4 F (36.9 C) 97.6 F (36.4 C)  TempSrc: Oral Oral Oral Oral  SpO2:  (!) 88% 90% (!) 72%  Weight:      Height:        Intake/Output Summary (Last 24 hours) at 02/09/2018 1810 Last data filed at 02/09/2018 0700 Gross per 24 hour  Intake 1418.75 ml  Output 1300 ml  Net 118.75 ml   Filed Weights   05/21/2017 1319 05/21/2017 1846 02/08/18 0300  Weight: 46.7 kg 46.3 kg 46.4 kg    Exam:  . General: 84 y.o. year-old female frail in no acute distress.  Pleasantly demented. . Cardiovascular: Regular rate and rhythm with no rubs or gallops.  No JVD  or thyromegaly noted. Marland Kitchen Respiratory: Clear to auscultation with no wheezes or rales.  Poor inspiratory effort. . Abdomen: Soft nontender nondistended with normal bowel sounds x4 quadrants. . Musculoskeletal: No lower extremity edema. 2/4 pulses in all 4 extremities. Marland Kitchen Psychiatry: Mood is appropriate for condition and setting   Data Reviewed: CBC: Recent Labs  Lab 02/20/18 1332 02/08/18 0333  WBC 11.9* 7.4  NEUTROABS 9.9*  --   HGB 9.9* 8.6*  HCT 31.8* 28.1*  MCV 91.9 92.7  PLT 166 148*   Basic Metabolic Panel: Recent Labs  Lab 02/20/18 1332  02/08/18 0333  NA 138 138  K 3.8 3.9  CL 103 110  CO2 22 19*  GLUCOSE 122* 95  BUN 42* 37*  CREATININE 1.82* 1.61*  CALCIUM 8.6* 7.8*   GFR: Estimated Creatinine Clearance: 15.3 mL/min (A) (by C-G formula based on SCr of 1.61 mg/dL (H)). Liver Function Tests: Recent Labs  Lab Feb 20, 2018 1332  AST 165*  ALT 55*  ALKPHOS 79  BILITOT 0.7  PROT 6.4*  ALBUMIN 3.4*   No results for input(s): LIPASE, AMYLASE in the last 168 hours. No results for input(s): AMMONIA in the last 168 hours. Coagulation Profile: No results for input(s): INR, PROTIME in the last 168 hours. Cardiac Enzymes: Recent Labs  Lab February 20, 2018 1332 2018-02-20 1532 02/08/18 0333  CKTOTAL 1,945*  --  916*  TROPONINI  --  10.46* 5.77*   BNP (last 3 results) No results for input(s): PROBNP in the last 8760 hours. HbA1C: No results for input(s): HGBA1C in the last 72 hours. CBG: No results for input(s): GLUCAP in the last 168 hours. Lipid Profile: No results for input(s): CHOL, HDL, LDLCALC, TRIG, CHOLHDL, LDLDIRECT in the last 72 hours. Thyroid Function Tests: No results for input(s): TSH, T4TOTAL, FREET4, T3FREE, THYROIDAB in the last 72 hours. Anemia Panel: No results for input(s): VITAMINB12, FOLATE, FERRITIN, TIBC, IRON, RETICCTPCT in the last 72 hours. Urine analysis:    Component Value Date/Time   COLORURINE AMBER (A) 20-Feb-2018 1430   APPEARANCEUR CLOUDY (A) February 20, 2018 1430   LABSPEC 1.013 20-Feb-2018 1430   PHURINE 5.0 02-20-18 1430   GLUCOSEU NEGATIVE 02/20/18 1430   HGBUR SMALL (A) 02-20-18 1430   BILIRUBINUR NEGATIVE 02/20/18 1430   BILIRUBINUR Neg 04/19/2014 1443   KETONESUR NEGATIVE 02/20/2018 1430   PROTEINUR 30 (A) 20-Feb-2018 1430   UROBILINOGEN 0.2 04/19/2014 1443   UROBILINOGEN 0.2 09/28/2013 1153   NITRITE NEGATIVE 2018-02-20 1430   LEUKOCYTESUR LARGE (A) 02-20-18 1430   Sepsis Labs: @LABRCNTIP (procalcitonin:4,lacticidven:4)  ) Recent Results (from the past 240 hour(s))   Urine Culture     Status: Abnormal   Collection Time: 02/20/18  3:46 PM  Result Value Ref Range Status   Specimen Description URINE, CATHETERIZED  Final   Special Requests   Final    Immunocompromised Performed at [facility], [address], [place], [place] [address]    Culture >=100,000 COLONIES/mL ESCHERICHIA COLI (A)  Final   Report Status 02/09/2018 FINAL  Final   Organism ID, Bacteria ESCHERICHIA COLI (A)  Final      Susceptibility   Escherichia coli - MIC*    AMPICILLIN 4 SENSITIVE Sensitive     CEFAZOLIN <=4 SENSITIVE Sensitive     CEFTRIAXONE <=1 SENSITIVE Sensitive     CIPROFLOXACIN <=0.25 SENSITIVE Sensitive     GENTAMICIN <=1 SENSITIVE Sensitive     IMIPENEM <=0.25 SENSITIVE Sensitive     NITROFURANTOIN <=16 SENSITIVE Sensitive     TRIMETH/SULFA <=20  SENSITIVE Sensitive     AMPICILLIN/SULBACTAM <=2 SENSITIVE Sensitive     PIP/TAZO <=4 SENSITIVE Sensitive     Extended ESBL NEGATIVE Sensitive     * >=100,000 COLONIES/mL ESCHERICHIA COLI      Studies: No results found.  Scheduled Meds:   Continuous Infusions:    LOS: 2 days     Desiree Drop, MD [facility] Pager [phone]  If 7PM-7AM, please contact night-coverage [email or web address] Password [email or web address] 02/09/2018, 6:10 PM

## 2018-02-09 DEATH — deceased

## 2018-02-10 DIAGNOSIS — G253 Myoclonus: Secondary | ICD-10-CM

## 2018-02-10 DIAGNOSIS — F0150 Vascular dementia without behavioral disturbance: Secondary | ICD-10-CM

## 2018-02-10 MED ORDER — SODIUM CHLORIDE 0.9 % IV SOLN
0.2500 mg/h | INTRAVENOUS | Status: DC
  Administered 2018-02-10: 0.5 mg/h via INTRAVENOUS
  Filled 2018-02-10: qty 2.5

## 2018-02-10 MED ORDER — LORAZEPAM 2 MG/ML IJ SOLN: 1.0000 mg | INTRAMUSCULAR | Status: DC | PRN

## 2018-02-10 MED ORDER — GLYCOPYRROLATE 0.2 MG/ML IJ SOLN: 0.2000 mg | INTRAMUSCULAR | Status: DC | PRN

## 2018-02-10 MED ORDER — LORAZEPAM 2 MG/ML IJ SOLN
0.5000 mg | Freq: Two times a day (BID) | INTRAMUSCULAR | Status: DC
  Administered 2018-02-10 (×2): 0.5 mg via INTRAVENOUS
  Filled 2018-02-10 (×2): qty 1

## 2018-02-10 MED ORDER — MORPHINE BOLUS VIA INFUSION
1.0000 mg | INTRAVENOUS | Status: DC | PRN
  Filled 2018-02-10: qty 1

## 2018-02-10 MED ORDER — HYDROMORPHONE BOLUS VIA INFUSION
0.5000 mg | INTRAVENOUS | Status: DC | PRN
  Filled 2018-02-10: qty 1

## 2018-02-11 NOTE — Progress Notes (Signed)
Wasted 46 ml of Dilaudid that was left in the bags after patient was disconnected from the medication, witnessed by Tama GanderJill Tsoutis RN

## 2018-02-12 NOTE — Discharge Summary (Signed)
Death Summary  Desiree [name] WJX:[id number] DOB: [birth year] DOA: February 09, 2018  PCP: Pincus Sanes, MD  Admit date: 02/09/2018 Date of Death: 02-14-2018 Time of Death: 07-20-2155 Notification: Pincus Sanes, MD notified of death of 2018/02/14   History of present illness:  Desiree [name] is a 84 y.o. femalewith a history ofHTN, diet-controlled T2DM, hyperlipidemia, hypothyroidism, GERD, moderate malnutrition and pleasantly demented, who lives at home with her daughter was brought in by family for weakness and pain following an unwitnessed fall at home. She was found down by her granddaughter and able to walk only with more assistance than usual, using a walker when she usually needs only a cane. She complained of bilateral shoulders pain and had some bruising. The pain in her shoulders was constant, severe, worse with any movement, radiating down the back, and only very modestly improved with fentanyl in the ED.   History is obtained from daughter and granddaughter predominantly due to patient's pain/distress and dementia.  Upon presentation to the ED, lab studies revealed elevated troponin. Patient found to have NSTEMI. Troponin peaked at 10.4 . Cardiology was consulted. Patient is DNR and her daughter who is also her medical POA made decision for Comfort Measures only. Palliative care consulted. Comfort care orders and comfort medication orders were implemented. Daughter in the room was updated on a regular basis. Patient expired at 2155/07/20. Daughter and son-in-law were in the room.  Final Diagnoses:  1.   Cardiopulmonary arrest 2.   NSTEMI   The results of significant diagnostics from this hospitalization (including imaging, microbiology, ancillary and laboratory) are listed below for reference.    Significant Diagnostic Studies: Ct Abdomen Pelvis Wo Contrast  Result Date: 02-09-18 CLINICAL DATA:  Hypotension.  Found on floor.  Diarrhea. EXAM: CT ABDOMEN AND PELVIS WITHOUT CONTRAST  TECHNIQUE: Multidetector CT imaging of the abdomen and pelvis was performed following the standard protocol without IV contrast. COMPARISON:  None. FINDINGS: Lower chest: No acute abnormality. Normal heart size. Aortic atherosclerosis and 3 vessel coronary artery atherosclerotic calcifications. Hepatobiliary: No focal liver abnormality is seen. No gallstones, gallbladder wall thickening, or biliary dilatation. Pancreas: Unremarkable. No pancreatic ductal dilatation or surrounding inflammatory changes. Spleen: Normal in size without focal abnormality. Adrenals/Urinary Tract: Adrenal glands are unremarkable. Kidneys are normal, without renal calculi, focal lesion, or hydronephrosis. Bladder is unremarkable. Stomach/Bowel: Stomach is within normal limits. Extensive colonic diverticulosis identified. No acute inflammation identified. Postoperative changes within the sigmoid colon identified. The appendix is visualized and appears normal. No evidence of bowel wall thickening, distention, or inflammatory changes. Vascular/Lymphatic: Aortic atherosclerosis. No aneurysm. No adenopathy within the abdomen or pelvis. Reproductive: Status post hysterectomy. No adnexal masses. Other: No free fluid or fluid collections. No abdominal wall hernia identified. Musculoskeletal: Spondylosis within the lower thoracic and lumbar spine. Status post posterior decompression and interbody fusion at L2-3, L3-4 and L4-5. Degenerative disc disease identified at the L1-2 and L5-S1. Chronic appearing right pubic rami fractures noted. IMPRESSION: 1. No acute findings within the abdomen or pelvis. 2.  Aortic Atherosclerosis (ICD10-I70.0). 3. Multi vessel coronary artery atherosclerotic calcifications. Electronically Signed   By: Signa Kell M.D.   On: 02/09/2018 16:09   Dg Shoulder Right  Result Date: Feb 09, 2018 CLINICAL DATA:  Shoulder pain EXAM: RIGHT SHOULDER - 2+ VIEW COMPARISON:  None. FINDINGS: Marked AC joint degenerative change.  High-riding humeral head consistent with rotator cuff disease. No fracture or dislocation. IMPRESSION: 1. No acute osseous abnormality. 2. Prominent degenerative changes. High-riding humeral head consistent with rotator  cuff disease. Electronically Signed   By: Jasmine Pang M.D.   On: 01/31/2018 14:58   Ct Head Wo Contrast  Result Date: 01/19/2018 CLINICAL DATA:  Unwitnessed fall, altered level of consciousness. EXAM: CT HEAD WITHOUT CONTRAST CT CERVICAL SPINE WITHOUT CONTRAST TECHNIQUE: Multidetector CT imaging of the head and cervical spine was performed following the standard protocol without intravenous contrast. Multiplanar CT image reconstructions of the cervical spine were also generated. COMPARISON:  None. FINDINGS: CT HEAD FINDINGS Brain: Mild chronic ischemic white matter disease is noted. No mass effect or midline shift is noted. Ventricular size is within normal limits. There is no evidence of mass lesion, hemorrhage or acute infarction. Vascular: No hyperdense vessel or unexpected calcification. Skull: Normal. Negative for fracture or focal lesion. Sinuses/Orbits: No acute finding. Other: None. CT CERVICAL SPINE FINDINGS Alignment: Normal. Skull base and vertebrae: No acute fracture. No primary bone lesion or focal pathologic process. Soft tissues and spinal canal: No prevertebral fluid or swelling. No visible canal hematoma. Disc levels: Large bridging anterior osteophyte formation is seen throughout the cervical spine. Disc spaces are otherwise normal. Upper chest: Negative. Other: None. IMPRESSION: Mild chronic ischemic white matter disease. No acute intracranial abnormality seen. Extensive degenerative changes are noted in the cervical spine. No fracture or spondylolisthesis is noted. Electronically Signed   By: Lupita Raider, M.D.   On: 01/22/2018 16:10   Ct Cervical Spine Wo Contrast  Result Date: 02/08/2018 CLINICAL DATA:  Unwitnessed fall, altered level of consciousness. EXAM: CT  HEAD WITHOUT CONTRAST CT CERVICAL SPINE WITHOUT CONTRAST TECHNIQUE: Multidetector CT imaging of the head and cervical spine was performed following the standard protocol without intravenous contrast. Multiplanar CT image reconstructions of the cervical spine were also generated. COMPARISON:  None. FINDINGS: CT HEAD FINDINGS Brain: Mild chronic ischemic white matter disease is noted. No mass effect or midline shift is noted. Ventricular size is within normal limits. There is no evidence of mass lesion, hemorrhage or acute infarction. Vascular: No hyperdense vessel or unexpected calcification. Skull: Normal. Negative for fracture or focal lesion. Sinuses/Orbits: No acute finding. Other: None. CT CERVICAL SPINE FINDINGS Alignment: Normal. Skull base and vertebrae: No acute fracture. No primary bone lesion or focal pathologic process. Soft tissues and spinal canal: No prevertebral fluid or swelling. No visible canal hematoma. Disc levels: Large bridging anterior osteophyte formation is seen throughout the cervical spine. Disc spaces are otherwise normal. Upper chest: Negative. Other: None. IMPRESSION: Mild chronic ischemic white matter disease. No acute intracranial abnormality seen. Extensive degenerative changes are noted in the cervical spine. No fracture or spondylolisthesis is noted. Electronically Signed   By: Lupita Raider, M.D.   On: 01/30/2018 16:10   Dg Chest Port 1 View  Result Date: 01/21/2018 CLINICAL DATA:  Hypotension, fall EXAM: PORTABLE CHEST 1 VIEW COMPARISON:  10/10/2011 FINDINGS: No focal opacity or pleural effusion. Stable cardiomediastinal silhouette. No pneumothorax. IMPRESSION: No active disease. Electronically Signed   By: Jasmine Pang M.D.   On: 01/11/2018 14:56   Dg Shoulder Left  Result Date: 02/08/2018 CLINICAL DATA:  Shoulder pain after fall EXAM: LEFT SHOULDER - 2+ VIEW COMPARISON:  None. FINDINGS: Moderate AC joint degenerative change. High-riding humeral head consistent with  rotator cuff disease. Moderate degenerative change of the glenohumeral interval. Left lung apex is clear. No fracture or dislocation IMPRESSION: 1. No acute osseous abnormality. 2. High-riding humeral head consistent with rotator cuff disease. Degenerative changes of the [facility] joint and glenohumeral interval Electronically Signed   By: Selena Batten  Jake SamplesFujinaga M.D.   On: Mar 20, 2017 14:57    Microbiology: Recent Results (from the past 240 hour(s))  Urine Culture     Status: Abnormal   Collection Time: 2017/08/05  3:46 PM  Result Value Ref Range Status   Specimen Description URINE, CATHETERIZED  Final   Special Requests   Final    Immunocompromised Performed at [facility], [address] [place], [place] [address]    Culture >=100,000 COLONIES/mL ESCHERICHIA COLI (A)  Final   Report Status 02/09/2018 FINAL  Final   Organism ID, Bacteria ESCHERICHIA COLI (A)  Final      Susceptibility   Escherichia coli - MIC*    AMPICILLIN 4 SENSITIVE Sensitive     CEFAZOLIN <=4 SENSITIVE Sensitive     CEFTRIAXONE <=1 SENSITIVE Sensitive     CIPROFLOXACIN <=0.25 SENSITIVE Sensitive     GENTAMICIN <=1 SENSITIVE Sensitive     IMIPENEM <=0.25 SENSITIVE Sensitive     NITROFURANTOIN <=16 SENSITIVE Sensitive     TRIMETH/SULFA <=20 SENSITIVE Sensitive     AMPICILLIN/SULBACTAM <=2 SENSITIVE Sensitive     PIP/TAZO <=4 SENSITIVE Sensitive     Extended ESBL NEGATIVE Sensitive     * >=100,000 COLONIES/mL ESCHERICHIA COLI     Labs: Basic Metabolic Panel: Recent Labs  Lab 2017/08/05 1332 02/08/18 0333  NA 138 138  K 3.8 3.9  CL 103 110  CO2 22 19*  GLUCOSE 122* 95  BUN 42* 37*  CREATININE 1.82* 1.61*  CALCIUM 8.6* 7.8*   Liver Function Tests: Recent Labs  Lab 2017/08/05 1332  AST 165*  ALT 55*  ALKPHOS 79  BILITOT 0.7  PROT 6.4*  ALBUMIN 3.4*   No results for input(s): LIPASE, AMYLASE in the last 168 hours. No results for input(s): AMMONIA in the last 168 hours. CBC: Recent Labs  Lab 2017/08/05 1332  02/08/18 0333  WBC 11.9* 7.4  NEUTROABS 9.9*  --   HGB 9.9* 8.6*  HCT 31.8* 28.1*  MCV 91.9 92.7  PLT 166 148*   Cardiac Enzymes: Recent Labs  Lab 2017/08/05 1332 2017/08/05 1532 02/08/18 0333  CKTOTAL 1,945*  --  916*  TROPONINI  --  10.46* 5.77*   D-Dimer No results for input(s): DDIMER in the last 72 hours. BNP: Invalid input(s): POCBNP CBG: No results for input(s): GLUCAP in the last 168 hours. Anemia work up No results for input(s): VITAMINB12, FOLATE, FERRITIN, TIBC, IRON, RETICCTPCT in the last 72 hours. Urinalysis    Component Value Date/Time   COLORURINE AMBER (A) Mar 20, 2017 1430   APPEARANCEUR CLOUDY (A) Mar 20, 2017 1430   LABSPEC 1.013 Mar 20, 2017 1430   PHURINE 5.0 Mar 20, 2017 1430   GLUCOSEU NEGATIVE Mar 20, 2017 1430   HGBUR SMALL (A) Mar 20, 2017 1430   BILIRUBINUR NEGATIVE Mar 20, 2017 1430   BILIRUBINUR Neg 04/19/2014 1443   KETONESUR NEGATIVE Mar 20, 2017 1430   PROTEINUR 30 (A) Mar 20, 2017 1430   UROBILINOGEN 0.2 04/19/2014 1443   UROBILINOGEN 0.2 09/28/2013 1153   NITRITE NEGATIVE Mar 20, 2017 1430   LEUKOCYTESUR LARGE (A) Mar 20, 2017 1430   Sepsis Labs Invalid input(s): PROCALCITONIN,  WBC,  LACTICIDVEN     SIGNED:  Darlin Droparole N Lalena Salas, MD  [facility] 02/12/2018, 6:42 PM Pager   If 7PM-7AM, please contact night-coverage [email or web address] Password [email or web address]

## 2018-03-12 NOTE — Progress Notes (Signed)
Called Amy Colette Rib[facility]Byrd [facility] to see is this patient fit the protocol for the ME to take since she fell prior to her admission and after discussing the MD notes, and her extensive medical problems, she released her and we can get her body ready to go to the morgue, will text page Ralph Leydenkathy Schorr back to sign death certificate.

## 2018-03-12 NOTE — Plan of Care (Signed)
IV Dilaudid hung for end of life comfort care as per order and request of her family.

## 2018-03-12 NOTE — Progress Notes (Signed)
Palliative Medicine RN Note: Patient has been transitioned to comfort care. I saw patient and discussed with Dr Phillips OdorGolding, PMT Medical Director.  Daughter is at bedside. She confirms that comfort is goal and that Dr Margo AyeHall explained that her mother's death is near. We discussed possible changes in breathing, coloration, ability to swallow and secretions. We talked about giving a few drops of fluid at a time versus letting her use a straw, as she has more trouble swallowing. Daughter [name] verbalized understanding. I also explained Dr Lamar BlinksGolding's new orders with her and RN.  Dr Phillips OdorGolding adjusted care orders and comfort medication orders. Plan for PMT to follow up this afternoon to ensure continued comfort. We have also requested palliative chaplain to come see Mrs Jimmye NormanFarmer and her family.  Margret ChanceMelanie G. Luther Newhouse, RN, BSN, [facility] Palliative Medicine Team 03/10/2018 11:38 AM Office [phone]

## 2018-03-12 NOTE — Progress Notes (Signed)
Asked by PMT RN to follow up on patient this PM.  Met family at bedside.  They are very appreciative of care.  Patient with some myoclonus that I witnessed and that the family described.  PE: Elderly frail female, very lethargic in fetal position, does not open eyes or speak.  Brow furrowed. Full body jerking x 2 CV rrr with sys murmur resp slightly coarse breath sounds, no distress. Abdomen soft. Extremities with anasarca  Assessment:  Patient nearing EOL.  Now with mild myoclonus  Recommendations: 1.  Will add low dose ativan scheduled and continue PRN (Adding PRN indication for jerking) 2.  Will try rotating away from morphine (sometimes metabolites cause myoclonus) to low dose dilaudid gtt. 3.  PMT will continue to follow.  Prognosis:  Hours to days.  Patient to fragile to move/transport.    Disposition:  Hospital death anticipated.  Florentina Jenny, PA-C Palliative Medicine Pager: [phone]  25 min.

## 2018-03-12 NOTE — Progress Notes (Addendum)
PROGRESS NOTE  Desiree [name] [id number] DOB: [birth year] DOA: 01/21/2018 PCP: Pincus SanesBurns, Stacy J, MD  HPI/Recap of past 24 hours: Desiree CooperAretta H Laird is a 85 y.o. female with a history of HTN, diet-controlled T2DM, hyperlipidemia, hypothyroidism, GERD, moderate malnutrition and dementia who lives at home and is brought in by family for weakness and pain following an unwitnessed fall at home. She was found down yesterday morning by her granddaughter, was oriented, and able to walk with slightly more assistance than usual, using a walker when she usually needs only a cane. She complained of some shoulder pain and had some bruising, but went to thanksgiving dinner that night, though she didn't eat very much because she was unable to lift her shoulders due to pain. Her weakness continued to the point that she was unable to assist getting up much at all this morning, but was able to eat some breakfast and take her BP and other medications. The pain in the shoulders was constant, severe, worse with any movement, radiating down the back, and only very modestly improved with fentanyl in the ED.   History is obtained from daughter and granddaughter predominantly due to patient's pain/distress and dementia.    02/08/2018: She is alert, pleasantly demented and appears comfortable in the bed.  Patient is DNR and family is leaning towards comfort care.  She denies chest pain, palpitation or dyspnea in the bed.  02/09/2018: Discussed with daughter who made a decision for comfort measures only.  Patient has no new complaints and denies any pain.  02/15/2018: Patient seen and examined with her daughter at her bedside.  She appears comfortable.  Palliative care team following.  Highly appreciated.  Assessment/Plan: Principal Problem:   Fall Active Problems:   Hypothyroidism   Diabetes (HCC)   Dementia (HCC)   Essential hypertension   GERD (gastroesophageal reflux disease)   Osteoarthritis   Malnutrition of  moderate degree (HCC)   Elevated troponin level   HOH (hard of hearing)   Rhabdomyolysis   Intractable pain   Acute renal failure (HCC)   Palliative care encounter   Pressure injury of skin  Assessment and plan Fall at home with rhabdomyolysis and muscular pain NSTEMI Bilateral shoulder pain post fall Chronic normocytic anemia AKI on CKD 2 UTI Hypothyroidism GERD Stage 2 pressure injuries: Sacrum, Stage II: POA Right Heel, Stage I: POA  Daughter who is medical POA made decision for comfort measures only.  All forms of treatment to prolong life have been withheld.  Possible discharge to hospice care tomorrow 02/25/2018 if vital signs become stable. Currently patient is too unstable for transport.  -On ropinirole 0.2 mg IV every 4 hours as needed for terminal secretions -On Ativan 1 mg IV every 4 hours as needed for anxiety -Morphine drip  Continue comfort care orders and comfort medication orders as recommended by palliative care team. Highly appreciated.      Objective: Vitals:   02/08/18 1959 02/09/18 0004 02/09/18 0510 02/09/18 1539  BP: (!) 75/30 (!) 75/42 (!) 84/24 105/60  Pulse: 81 86 87 82  Resp: 13  16   Temp: 98.6 F (37 C) 98.6 F (37 C) 98.4 F (36.9 C) 97.6 F (36.4 C)  TempSrc: Oral Oral Oral Oral  SpO2:  (!) 88% 90% (!) 72%  Weight:      Height:        Intake/Output Summary (Last 24 hours) at 02/13/2018 1405 Last data filed at 02/17/2018 0600 Gross per 24 hour  Intake 69.17  ml  Output -  Net 69.17 ml   Filed Weights   18-Feb-2018 1319 2018-02-18 1846 02/08/18 0300  Weight: 46.7 kg 46.3 kg 46.4 kg    Exam:  . General: 85 y.o. year-old female frail appears comfortable. . Cardiovascular: Regular rate and rhythm with no rubs or gallops.  No JVD or thyromegaly noted. Marland Kitchen Respiratory: Shallow breath.  Nearing death. . Abdomen: Hypoactive bowel sounds. . Musculoskeletal: No lower extremity edema. 2/4 pulses in all 4 extremities. Marland Kitchen Psychiatry: Mood is  appropriate for condition and setting   Data Reviewed: CBC: Recent Labs  Lab February 18, 2018 1332 02/08/18 0333  WBC 11.9* 7.4  NEUTROABS 9.9*  --   HGB 9.9* 8.6*  HCT 31.8* 28.1*  MCV 91.9 92.7  PLT 166 148*   Basic Metabolic Panel: Recent Labs  Lab 02-18-18 1332 02/08/18 0333  NA 138 138  K 3.8 3.9  CL 103 110  CO2 22 19*  GLUCOSE 122* 95  BUN 42* 37*  CREATININE 1.82* 1.61*  CALCIUM 8.6* 7.8*   GFR: Estimated Creatinine Clearance: 15.3 mL/min (A) (by C-G formula based on SCr of 1.61 mg/dL (H)). Liver Function Tests: Recent Labs  Lab 02/18/2018 1332  AST 165*  ALT 55*  ALKPHOS 79  BILITOT 0.7  PROT 6.4*  ALBUMIN 3.4*   No results for input(s): LIPASE, AMYLASE in the last 168 hours. No results for input(s): AMMONIA in the last 168 hours. Coagulation Profile: No results for input(s): INR, PROTIME in the last 168 hours. Cardiac Enzymes: Recent Labs  Lab Feb 18, 2018 1332 02/18/2018 1532 02/08/18 0333  CKTOTAL 1,945*  --  916*  TROPONINI  --  10.46* 5.77*   BNP (last 3 results) No results for input(s): PROBNP in the last 8760 hours. HbA1C: No results for input(s): HGBA1C in the last 72 hours. CBG: No results for input(s): GLUCAP in the last 168 hours. Lipid Profile: No results for input(s): CHOL, HDL, LDLCALC, TRIG, CHOLHDL, LDLDIRECT in the last 72 hours. Thyroid Function Tests: No results for input(s): TSH, T4TOTAL, FREET4, T3FREE, THYROIDAB in the last 72 hours. Anemia Panel: No results for input(s): VITAMINB12, FOLATE, FERRITIN, TIBC, IRON, RETICCTPCT in the last 72 hours. Urine analysis:    Component Value Date/Time   COLORURINE AMBER (A) 02/18/18 1430   APPEARANCEUR CLOUDY (A) 02/18/18 1430   LABSPEC 1.013 18-Feb-2018 1430   PHURINE 5.0 02/18/18 1430   GLUCOSEU NEGATIVE 18-Feb-2018 1430   HGBUR SMALL (A) 02/18/18 1430   BILIRUBINUR NEGATIVE 2018-02-18 1430   BILIRUBINUR Neg 04/19/2014 1443   KETONESUR NEGATIVE 02/18/18 1430   PROTEINUR 30  (A) 02-18-18 1430   UROBILINOGEN 0.2 04/19/2014 1443   UROBILINOGEN 0.2 09/28/2013 1153   NITRITE NEGATIVE 2018/02/18 1430   LEUKOCYTESUR LARGE (A) 2018-02-18 1430   Sepsis Labs: @LABRCNTIP (procalcitonin:4,lacticidven:4)  ) Recent Results (from the past 240 hour(s))  Urine Culture     Status: Abnormal   Collection Time: 2018-02-18  3:46 PM  Result Value Ref Range Status   Specimen Description URINE, CATHETERIZED  Final   Special Requests   Final    Immunocompromised Performed at [facility], [address] [place], [place] [address]    Culture >=100,000 COLONIES/mL ESCHERICHIA COLI (A)  Final   Report Status 02/09/2018 FINAL  Final   Organism ID, Bacteria ESCHERICHIA COLI (A)  Final      Susceptibility   Escherichia coli - MIC*    AMPICILLIN 4 SENSITIVE Sensitive     CEFAZOLIN <=4 SENSITIVE Sensitive  CEFTRIAXONE <=1 SENSITIVE Sensitive     CIPROFLOXACIN <=0.25 SENSITIVE Sensitive     GENTAMICIN <=1 SENSITIVE Sensitive     IMIPENEM <=0.25 SENSITIVE Sensitive     NITROFURANTOIN <=16 SENSITIVE Sensitive     TRIMETH/SULFA <=20 SENSITIVE Sensitive     AMPICILLIN/SULBACTAM <=2 SENSITIVE Sensitive     PIP/TAZO <=4 SENSITIVE Sensitive     Extended ESBL NEGATIVE Sensitive     * >=100,000 COLONIES/mL ESCHERICHIA COLI      Studies: No results found.  Scheduled Meds:   Continuous Infusions: . morphine 1 mg/hr (02/14/2018 0600)     LOS: 3 days     Darlin Drop, MD [facility] Pager [phone]  If 7PM-7AM, please contact night-coverage [email or web address] Password [email or web address] 03/07/2018, 2:05 PM

## 2018-03-12 NOTE — Progress Notes (Signed)
Desiree MessierKathy [name] called back and wants me to check with Medical Examiner since patient fell prior to her admission on 05-19-2017, awaiting call back for further instructions.

## 2018-03-12 NOTE — Progress Notes (Signed)
I went in to give my patient her IV Ativan and she was started to have more shallow breaths with cyanosis of lips/nailbed/legs/feet but her daughter requested that I give her Ativan IV to make sure she wasn't having any discomfort,  and within minutes afterward, she was found not breathing and Central telemetry called to notify ne that she was asystole on the monitor. I listened for a pulse apically, no heart beat or respirations heard and was verified by second nurse. Daughter and son-in-law were in the room and I gave my condolences and got some information for the funeral home and spent a little time helping the daughter with her grieving process. [employer] called and information received, also called funeral home and notified [employer] secretary(Kathy Schorr). Family is gathering her belongings and saying their Good Byes.

## 2018-03-12 NOTE — Progress Notes (Signed)
Had attempted to asses pt this AM  And this PM. Pt daughter had politely requested  for her not to be bothered and to keep her rested .

## 2018-03-12 DEATH — deceased

## 2020-09-21 IMAGING — CT CT CERVICAL SPINE W/O CM
4 of 7 series · 15 of 33 positions shown, 17 images · non-contrast
Comparison: None.

CLINICAL DATA: Unwitnessed fall, altered level of consciousness.

EXAM:
CT HEAD WITHOUT CONTRAST
CT CERVICAL SPINE WITHOUT CONTRAST
TECHNIQUE: Multidetector CT imaging of the head and cervical spine was
performed following the standard protocol without intravenous
contrast. Multiplanar CT image reconstructions of the cervical spine
were also generated.

[Series 5: head 5.0 h30s · axial · 0.41mm/px · z∈[-84,-34]mm · 2 of 30 slices shown]
[im 10/30  bone]
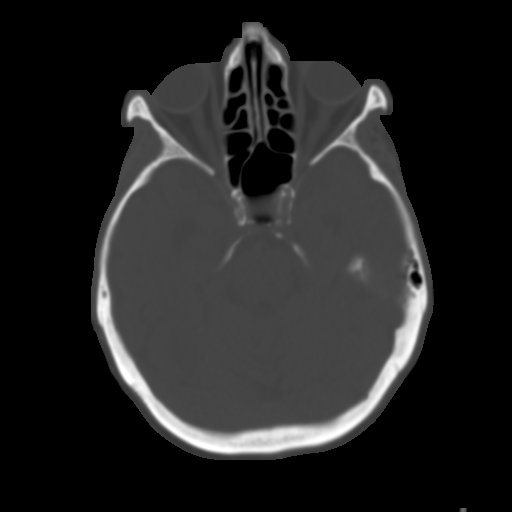
[im 20/30  bone]
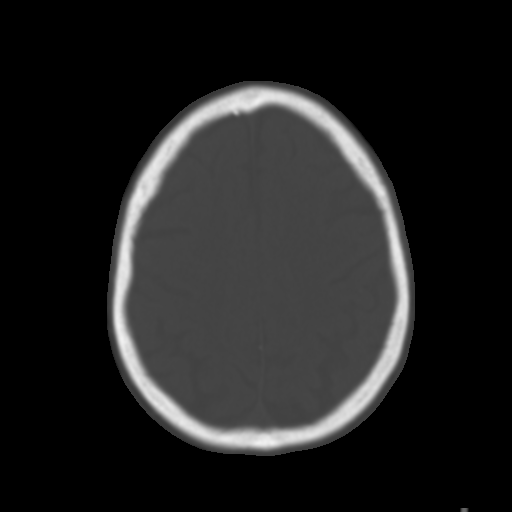

[Series 10: c_spine 2.0 st · axial · 0.49mm/px · z∈[-249,-127]mm · 6 of 70 slices shown, 8 images]
[im 10/70  soft-tissue]
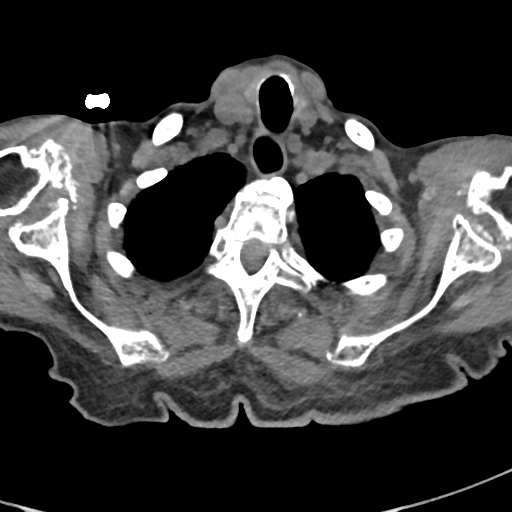
[im 10/70  bone]
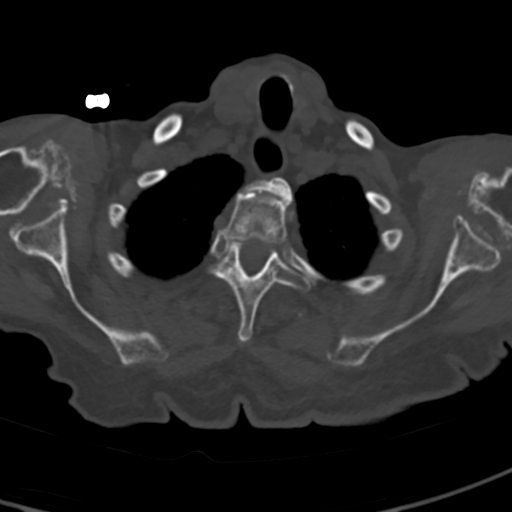
[im 20/70  bone]
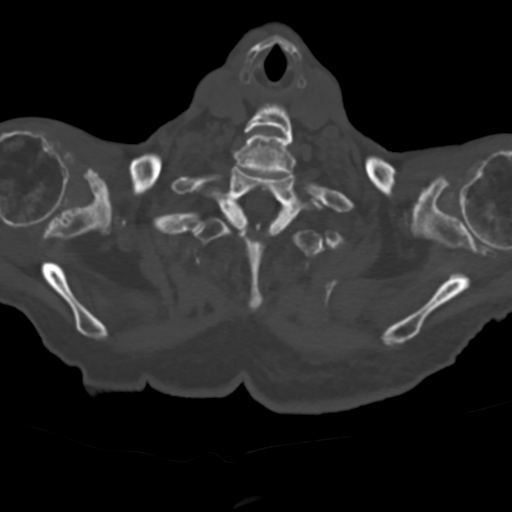
[im 30/70  bone]
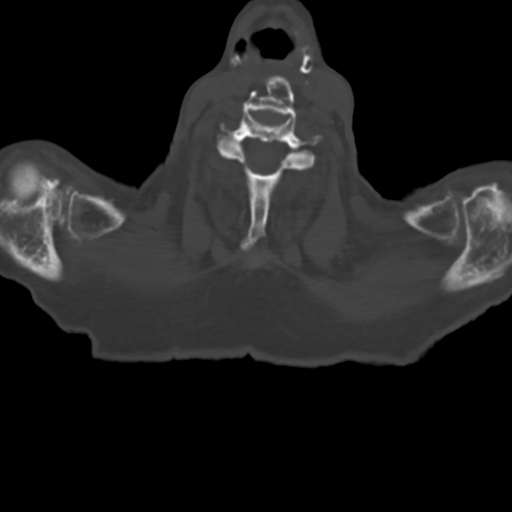
[im 40/70  bone]
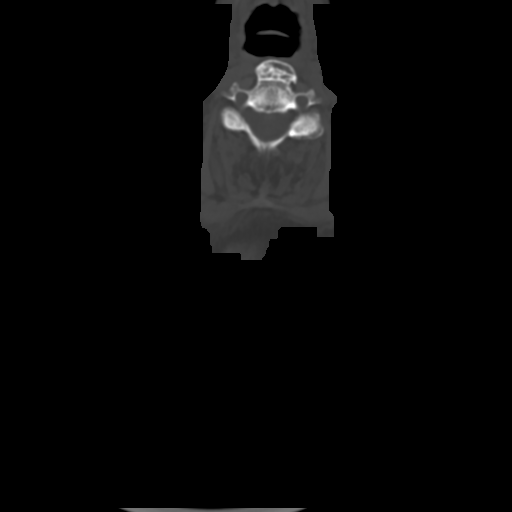
[im 50/70  soft-tissue]
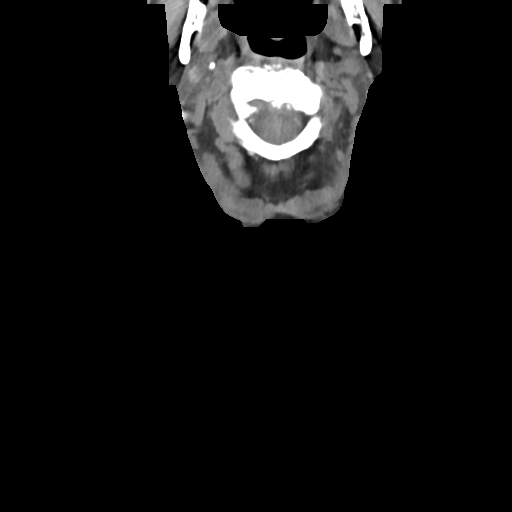
[im 50/70  bone]
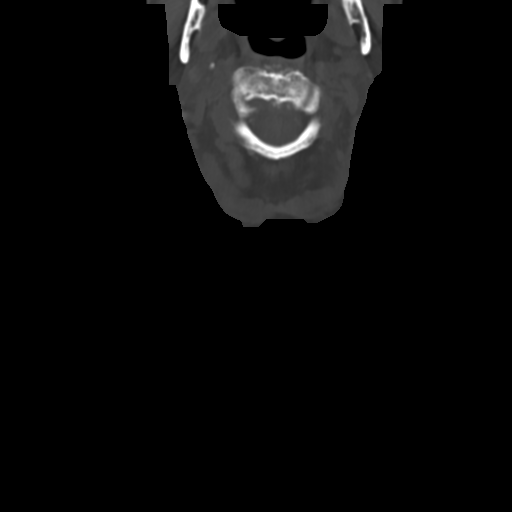
[im 60/70  bone]
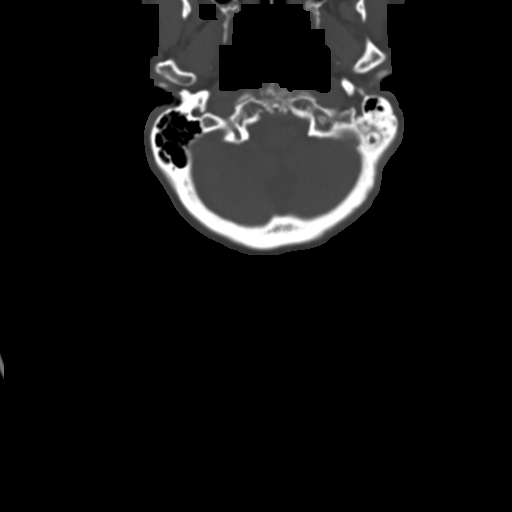

[Series 11: coronal bone · coronal · 0.29mm/px · 2 of 108 slices shown]
[im 36/108  bone]
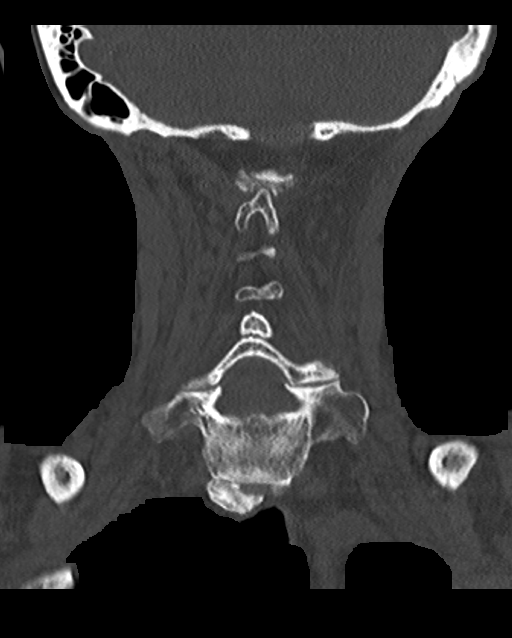
[im 72/108  bone]
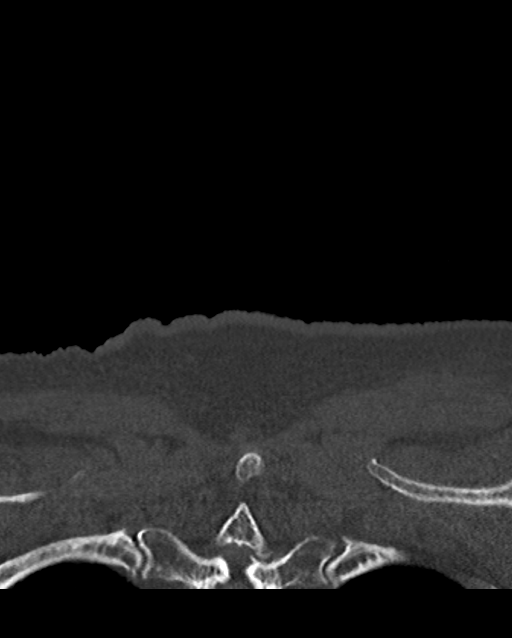

[Series 12: sagittal bone · sagittal · 0.37mm/px · 5 of 65 slices shown]
[im 11/65  bone]
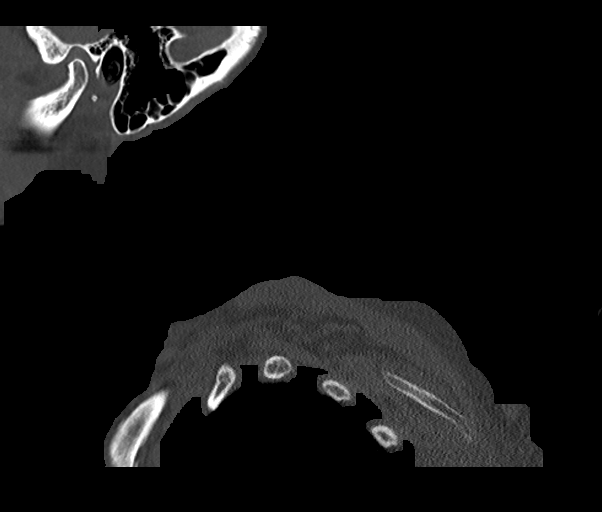
[im 22/65  bone]
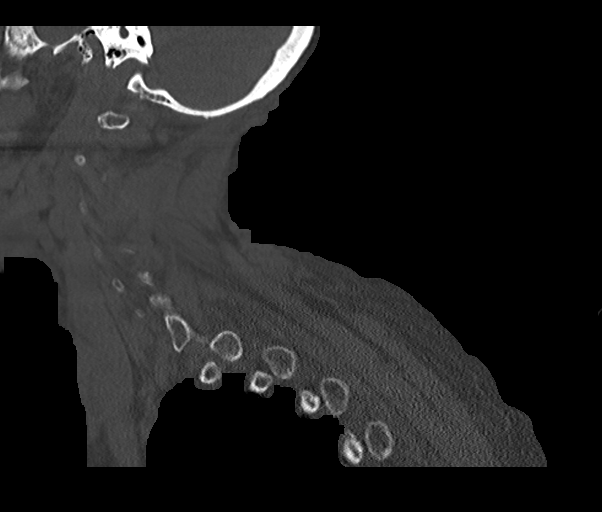
[im 33/65  bone]
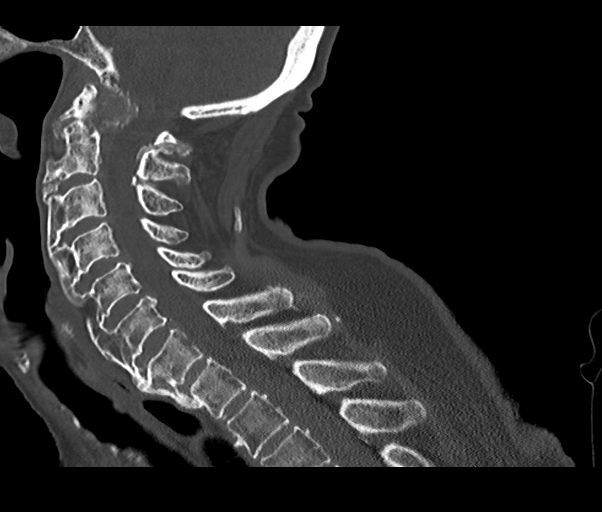
[im 43/65  bone]
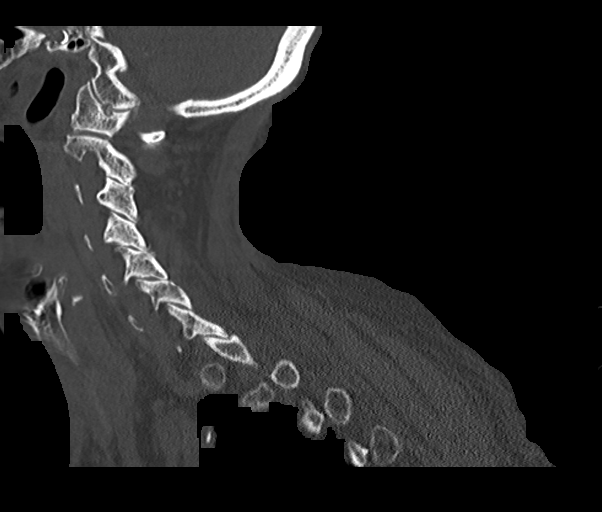
[im 54/65  bone]
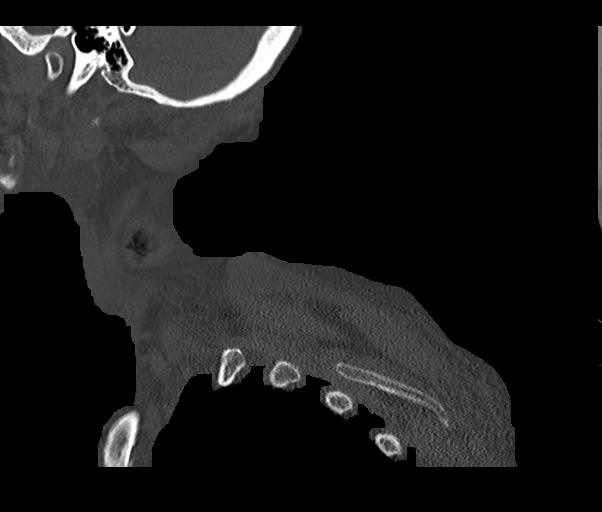

[15 of 33 positions shown; findings below may reference images not displayed]

FINDINGS: CT HEAD FINDINGS

Brain: Mild chronic ischemic white matter disease is noted. No mass
effect or midline shift is noted. Ventricular size is within normal
limits. There is no evidence of mass lesion, hemorrhage or acute
infarction.

Vascular: No hyperdense vessel or unexpected calcification.

Skull: Normal. Negative for fracture or focal lesion.

Sinuses/Orbits: No acute finding.

Other: None.

CT CERVICAL SPINE FINDINGS

Alignment: Normal.

Skull base and vertebrae: No acute fracture. No primary bone lesion
or focal pathologic process.

Soft tissues and spinal canal: No prevertebral fluid or swelling. No
visible canal hematoma.

Disc levels: Large bridging anterior osteophyte formation is seen
throughout the cervical spine. Disc spaces are otherwise normal.

Upper chest: Negative.

Other: None.
IMPRESSION: Mild chronic ischemic white matter disease. No acute intracranial
abnormality seen.

Extensive degenerative changes are noted in the cervical spine. No
fracture or spondylolisthesis is noted.

## 2020-09-21 IMAGING — DX DG CHEST 1V PORT
1 series · 1 of 1 positions shown · non-contrast
Comparison: 10/10/2011

CLINICAL DATA: Hypotension, fall

EXAM:
PORTABLE CHEST 1 VIEW

[chest ap]
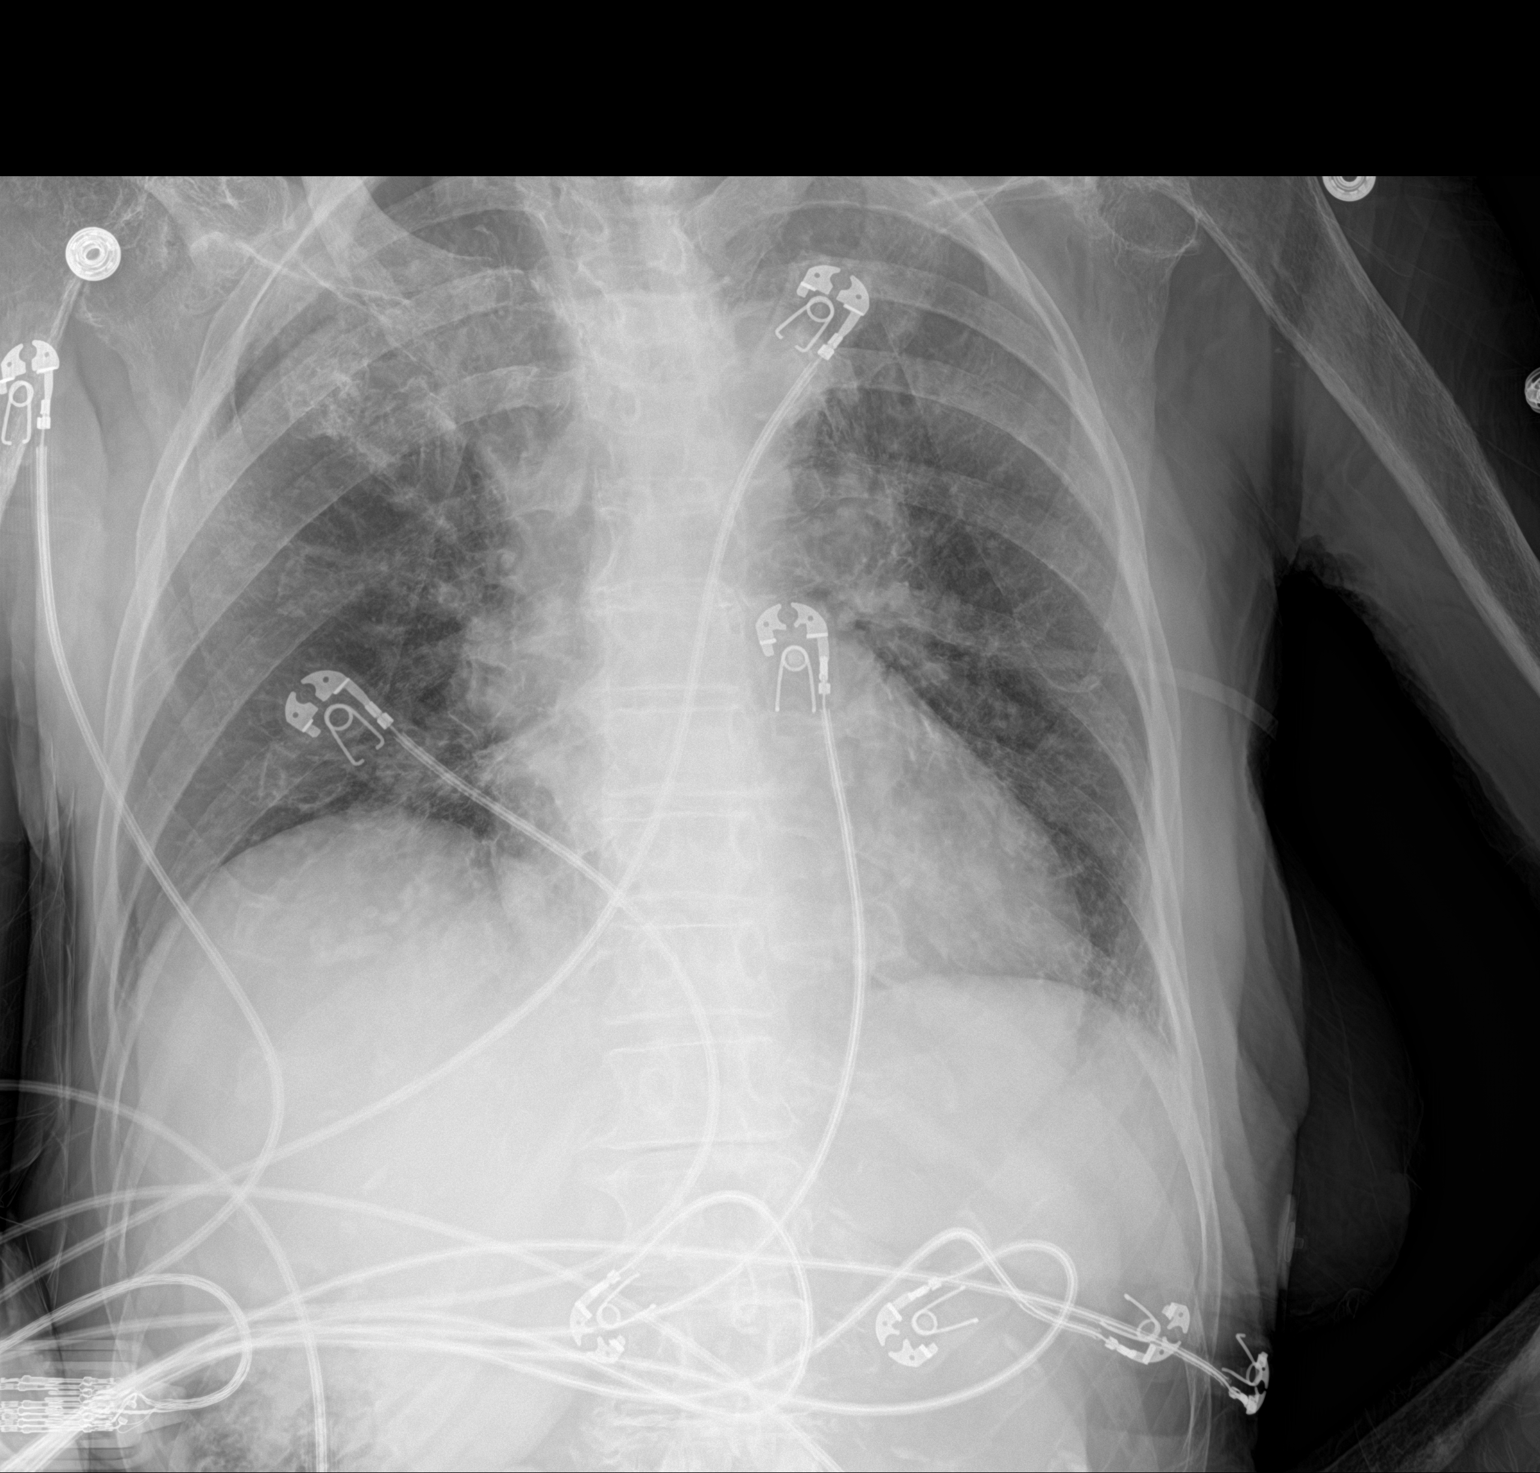

[1 of 1 positions shown; findings below may reference images not displayed]

FINDINGS: No focal opacity or pleural effusion. Stable cardiomediastinal
silhouette. No pneumothorax.
IMPRESSION: No active disease.
